# Patient Record
Sex: Male | Born: 1935 | Race: Black or African American | Hispanic: No | Marital: Single | Smoking: Former smoker
Health system: Southern US, Community
[De-identification: ages and names within clinical notes are randomized; demographics above are authoritative.]

## PROBLEM LIST (undated history)

## (undated) DIAGNOSIS — E785 Hyperlipidemia, unspecified: Secondary | ICD-10-CM

## (undated) DIAGNOSIS — M1990 Unspecified osteoarthritis, unspecified site: Secondary | ICD-10-CM

## (undated) DIAGNOSIS — N35919 Unspecified urethral stricture, male, unspecified site: Secondary | ICD-10-CM

## (undated) HISTORY — DX: Unspecified urethral stricture, male, unspecified site: N35.919

## (undated) HISTORY — DX: Hyperlipidemia, unspecified: E78.5

## (undated) HISTORY — DX: Unspecified osteoarthritis, unspecified site: M19.90

---

## 2009-09-13 ENCOUNTER — Ambulatory Visit: Payer: Self-pay

## 2009-10-12 ENCOUNTER — Ambulatory Visit: Payer: Self-pay | Admitting: Gastroenterology

## 2010-10-06 ENCOUNTER — Ambulatory Visit: Payer: Self-pay

## 2010-12-23 ENCOUNTER — Ambulatory Visit: Payer: Self-pay | Admitting: Urology

## 2011-01-03 ENCOUNTER — Ambulatory Visit: Payer: Self-pay | Admitting: Urology

## 2012-09-04 DIAGNOSIS — I714 Abdominal aortic aneurysm, without rupture, unspecified: Secondary | ICD-10-CM | POA: Insufficient documentation

## 2012-09-16 DIAGNOSIS — B3749 Other urogenital candidiasis: Secondary | ICD-10-CM | POA: Insufficient documentation

## 2012-09-16 DIAGNOSIS — N138 Other obstructive and reflux uropathy: Secondary | ICD-10-CM | POA: Insufficient documentation

## 2012-09-16 DIAGNOSIS — R339 Retention of urine, unspecified: Secondary | ICD-10-CM | POA: Insufficient documentation

## 2012-09-16 DIAGNOSIS — Z79899 Other long term (current) drug therapy: Secondary | ICD-10-CM | POA: Insufficient documentation

## 2012-09-16 DIAGNOSIS — N401 Enlarged prostate with lower urinary tract symptoms: Secondary | ICD-10-CM | POA: Insufficient documentation

## 2012-09-16 DIAGNOSIS — E291 Testicular hypofunction: Secondary | ICD-10-CM | POA: Insufficient documentation

## 2012-09-16 DIAGNOSIS — N529 Male erectile dysfunction, unspecified: Secondary | ICD-10-CM | POA: Insufficient documentation

## 2012-09-16 DIAGNOSIS — D414 Neoplasm of uncertain behavior of bladder: Secondary | ICD-10-CM | POA: Insufficient documentation

## 2012-09-16 DIAGNOSIS — N3000 Acute cystitis without hematuria: Secondary | ICD-10-CM | POA: Insufficient documentation

## 2013-01-29 ENCOUNTER — Ambulatory Visit: Payer: Self-pay

## 2013-02-27 ENCOUNTER — Ambulatory Visit: Payer: Self-pay | Admitting: Gastroenterology

## 2013-02-28 LAB — PATHOLOGY REPORT

## 2014-04-16 DIAGNOSIS — N471 Phimosis: Secondary | ICD-10-CM | POA: Insufficient documentation

## 2014-04-16 DIAGNOSIS — N478 Other disorders of prepuce: Secondary | ICD-10-CM | POA: Insufficient documentation

## 2014-04-16 DIAGNOSIS — N481 Balanitis: Secondary | ICD-10-CM | POA: Insufficient documentation

## 2014-06-04 DIAGNOSIS — K625 Hemorrhage of anus and rectum: Secondary | ICD-10-CM | POA: Insufficient documentation

## 2014-06-04 DIAGNOSIS — K5903 Drug induced constipation: Secondary | ICD-10-CM | POA: Insufficient documentation

## 2014-06-06 DIAGNOSIS — R39198 Other difficulties with micturition: Secondary | ICD-10-CM | POA: Insufficient documentation

## 2014-06-06 DIAGNOSIS — R350 Frequency of micturition: Secondary | ICD-10-CM | POA: Insufficient documentation

## 2014-07-16 ENCOUNTER — Ambulatory Visit: Payer: Self-pay | Admitting: Orthopedic Surgery

## 2014-07-16 LAB — SEDIMENTATION RATE: ERYTHROCYTE SED RATE: 55 mm/h — AB (ref 0–20)

## 2014-07-16 LAB — CBC WITH DIFFERENTIAL/PLATELET
Basophil #: 0 10*3/uL (ref 0.0–0.1)
Basophil %: 0.5 %
EOS PCT: 8.7 %
Eosinophil #: 0.6 10*3/uL (ref 0.0–0.7)
HCT: 37 % — AB (ref 40.0–52.0)
HGB: 12 g/dL — ABNORMAL LOW (ref 13.0–18.0)
LYMPHS PCT: 34.6 %
Lymphocyte #: 2.4 10*3/uL (ref 1.0–3.6)
MCH: 28.4 pg (ref 26.0–34.0)
MCHC: 32.4 g/dL (ref 32.0–36.0)
MCV: 88 fL (ref 80–100)
MONOS PCT: 15 %
Monocyte #: 1 x10 3/mm (ref 0.2–1.0)
Neutrophil #: 2.8 10*3/uL (ref 1.4–6.5)
Neutrophil %: 41.2 %
Platelet: 184 10*3/uL (ref 150–440)
RBC: 4.22 10*6/uL — AB (ref 4.40–5.90)
RDW: 14.7 % — ABNORMAL HIGH (ref 11.5–14.5)
WBC: 6.9 10*3/uL (ref 3.8–10.6)

## 2014-09-05 ENCOUNTER — Emergency Department: Payer: Self-pay | Admitting: Student

## 2014-11-16 DIAGNOSIS — M1712 Unilateral primary osteoarthritis, left knee: Secondary | ICD-10-CM | POA: Insufficient documentation

## 2015-01-01 DIAGNOSIS — M7582 Other shoulder lesions, left shoulder: Secondary | ICD-10-CM | POA: Insufficient documentation

## 2015-01-01 DIAGNOSIS — M7542 Impingement syndrome of left shoulder: Secondary | ICD-10-CM | POA: Insufficient documentation

## 2015-01-01 DIAGNOSIS — M7541 Impingement syndrome of right shoulder: Secondary | ICD-10-CM | POA: Insufficient documentation

## 2015-01-08 DIAGNOSIS — I351 Nonrheumatic aortic (valve) insufficiency: Secondary | ICD-10-CM | POA: Insufficient documentation

## 2015-01-08 DIAGNOSIS — R600 Localized edema: Secondary | ICD-10-CM | POA: Insufficient documentation

## 2015-09-21 ENCOUNTER — Other Ambulatory Visit: Payer: Self-pay | Admitting: Surgery

## 2015-09-21 DIAGNOSIS — M7582 Other shoulder lesions, left shoulder: Secondary | ICD-10-CM

## 2015-09-21 DIAGNOSIS — M7542 Impingement syndrome of left shoulder: Secondary | ICD-10-CM

## 2015-09-21 DIAGNOSIS — M7541 Impingement syndrome of right shoulder: Secondary | ICD-10-CM

## 2015-10-12 ENCOUNTER — Ambulatory Visit
Admission: RE | Admit: 2015-10-12 | Discharge: 2015-10-12 | Disposition: A | Discharge status: Home / Self Care | Payer: Medicare Other | Source: Ambulatory Visit | Attending: Surgery | Admitting: Surgery

## 2015-10-12 DIAGNOSIS — M7552 Bursitis of left shoulder: Secondary | ICD-10-CM | POA: Insufficient documentation

## 2015-10-12 DIAGNOSIS — M7582 Other shoulder lesions, left shoulder: Secondary | ICD-10-CM | POA: Diagnosis present

## 2015-10-12 DIAGNOSIS — M7541 Impingement syndrome of right shoulder: Secondary | ICD-10-CM

## 2015-10-12 DIAGNOSIS — M7542 Impingement syndrome of left shoulder: Secondary | ICD-10-CM

## 2015-10-12 DIAGNOSIS — M12812 Other specific arthropathies, not elsewhere classified, left shoulder: Secondary | ICD-10-CM | POA: Diagnosis not present

## 2015-10-15 DIAGNOSIS — M75112 Incomplete rotator cuff tear or rupture of left shoulder, not specified as traumatic: Secondary | ICD-10-CM | POA: Insufficient documentation

## 2016-01-20 ENCOUNTER — Other Ambulatory Visit: Payer: Self-pay | Admitting: Physical Medicine & Rehabilitation

## 2016-01-20 DIAGNOSIS — M48061 Spinal stenosis, lumbar region without neurogenic claudication: Secondary | ICD-10-CM

## 2016-02-10 ENCOUNTER — Ambulatory Visit
Admission: RE | Admit: 2016-02-10 | Discharge: 2016-02-10 | Disposition: A | Discharge status: Home / Self Care | Payer: Medicare Other | Source: Ambulatory Visit | Attending: Physical Medicine & Rehabilitation | Admitting: Physical Medicine & Rehabilitation

## 2016-02-10 DIAGNOSIS — M48061 Spinal stenosis, lumbar region without neurogenic claudication: Secondary | ICD-10-CM

## 2016-02-10 DIAGNOSIS — M4806 Spinal stenosis, lumbar region: Secondary | ICD-10-CM | POA: Diagnosis present

## 2016-06-09 DIAGNOSIS — M47816 Spondylosis without myelopathy or radiculopathy, lumbar region: Secondary | ICD-10-CM | POA: Insufficient documentation

## 2016-07-24 ENCOUNTER — Other Ambulatory Visit (INDEPENDENT_AMBULATORY_CARE_PROVIDER_SITE_OTHER): Payer: Self-pay | Admitting: Vascular Surgery

## 2016-07-24 DIAGNOSIS — I714 Abdominal aortic aneurysm, without rupture, unspecified: Secondary | ICD-10-CM

## 2016-07-24 DIAGNOSIS — I723 Aneurysm of iliac artery: Secondary | ICD-10-CM

## 2016-07-25 ENCOUNTER — Ambulatory Visit (INDEPENDENT_AMBULATORY_CARE_PROVIDER_SITE_OTHER): Payer: Medicare Other

## 2016-07-25 ENCOUNTER — Other Ambulatory Visit: Payer: Self-pay | Admitting: Infectious Diseases

## 2016-07-25 DIAGNOSIS — I723 Aneurysm of iliac artery: Secondary | ICD-10-CM | POA: Diagnosis not present

## 2016-07-25 DIAGNOSIS — I714 Abdominal aortic aneurysm, without rupture, unspecified: Secondary | ICD-10-CM

## 2016-07-25 DIAGNOSIS — R42 Dizziness and giddiness: Secondary | ICD-10-CM | POA: Insufficient documentation

## 2016-08-04 ENCOUNTER — Ambulatory Visit
Admission: RE | Admit: 2016-08-04 | Discharge: 2016-08-04 | Disposition: A | Discharge status: Home / Self Care | Payer: Medicare Other | Source: Ambulatory Visit | Attending: Infectious Diseases | Admitting: Infectious Diseases

## 2016-08-04 ENCOUNTER — Encounter (INDEPENDENT_AMBULATORY_CARE_PROVIDER_SITE_OTHER): Payer: Self-pay

## 2016-08-04 ENCOUNTER — Ambulatory Visit (INDEPENDENT_AMBULATORY_CARE_PROVIDER_SITE_OTHER): Payer: Self-pay | Admitting: Vascular Surgery

## 2016-08-04 DIAGNOSIS — R42 Dizziness and giddiness: Secondary | ICD-10-CM | POA: Diagnosis not present

## 2016-08-04 DIAGNOSIS — R9082 White matter disease, unspecified: Secondary | ICD-10-CM | POA: Insufficient documentation

## 2016-09-05 ENCOUNTER — Ambulatory Visit (INDEPENDENT_AMBULATORY_CARE_PROVIDER_SITE_OTHER): Payer: Medicare Other

## 2016-09-05 ENCOUNTER — Encounter (INDEPENDENT_AMBULATORY_CARE_PROVIDER_SITE_OTHER): Payer: Self-pay | Admitting: Vascular Surgery

## 2016-09-05 ENCOUNTER — Ambulatory Visit (INDEPENDENT_AMBULATORY_CARE_PROVIDER_SITE_OTHER): Payer: Medicare Other | Admitting: Vascular Surgery

## 2016-09-05 ENCOUNTER — Other Ambulatory Visit (INDEPENDENT_AMBULATORY_CARE_PROVIDER_SITE_OTHER): Payer: Self-pay | Admitting: Vascular Surgery

## 2016-09-05 VITALS — BP 150/82 | HR 72 | Resp 16 | Ht 74.0 in | Wt 282.0 lb

## 2016-09-05 DIAGNOSIS — M79603 Pain in arm, unspecified: Secondary | ICD-10-CM

## 2016-09-05 DIAGNOSIS — I723 Aneurysm of iliac artery: Secondary | ICD-10-CM

## 2016-09-05 DIAGNOSIS — M7989 Other specified soft tissue disorders: Secondary | ICD-10-CM

## 2016-09-05 DIAGNOSIS — I10 Essential (primary) hypertension: Secondary | ICD-10-CM | POA: Diagnosis not present

## 2016-09-05 NOTE — Progress Notes (Signed)
MRN : [id number]  [name] Bean is a 82 y.o. (1935-05-20) male who presents with chief complaint of  Chief Complaint  Patient presents with  . Re-evaluation    Ultrasound follow up  .  History of Present Illness: Patient returns today in follow up of two vascular issues. He denies any aneurysm related symptoms. Specifically, the patient denies new back or abdominal pain, or signs of peripheral embolization. His aneurysm duplex from several weeks ago demonstrates stable right common iliac artery aneurysm measuring about 2.2 cm in maximal diameter and left common iliac artery aneurysm measuring just less than 2 cm in maximal diameter. His aorta is ectatic at just less than 3 cm. He also has a venous duplex today to evaluate his leg swelling. His leg swelling is reasonably stable. It has not changed a lot since his last visit. His duplex shows no evidence of DVT or acute superficial thrombophlebitis. There is evidence of previous thrombophlebitis in both small saphenous veins. No superficial venous reflux is identified.  Meds albuterol (PROVENTIL HFA;VENTOLIN HFA) 108 (90 Base) MCG/ACT inhaler           ANDROGEL PUMP 20.25 MG/ACT (1.62%) GEL          aspirin EC 81 MG tablet 81 mg         azelastine (ASTELIN) 0.1 % nasal spray          Cholecalciferol (VITAMIN D3) 1000 units CAPS          docusate sodium (STOOL SOFTENER) 100 MG capsule          fluticasone (FLONASE) 50 MCG/ACT nasal spray          loratadine (CLARITIN) 10 MG tablet          metolazone (ZAROXOLYN) 2.5 MG tablet          montelukast (SINGULAIR) 10 MG tablet          Multiple Vitamin (MULTIVITAMIN) capsule          omeprazole (PRILOSEC) 40 MG capsule          oxyCODONE-acetaminophen (PERCOCET) 10-325 MG tablet          potassium chloride SA (KLOR-CON M20) 20 MEQ tablet          simvastatin (ZOCOR) 10 MG tablet          torsemide (DEMADEX) 20 MG tablet           PMH Hypertension Hyperlipidemia  Social History No ETOH,  tobacco, or IVDU  Family History No bleeding disorder or clotting disorders  Allergies Amoxil Mecobalamin   REVIEW OF SYSTEMS (Negative unless checked)  Constitutional: [] Weight loss  [] Fever  [] Chills Cardiac: [] Chest pain   [] Chest pressure   [] Palpitations   [] Shortness of breath when laying flat   [] Shortness of breath at rest   [] Shortness of breath with exertion. Vascular:  [] Pain in legs with walking   [] Pain in legs at rest   [] Pain in legs when laying flat   [] Claudication   [] Pain in feet when walking  [] Pain in feet at rest  [] Pain in feet when laying flat   [] History of DVT   [] Phlebitis   [x] Swelling in legs   [] Varicose veins   [] Non-healing ulcers Pulmonary:   [] Uses home oxygen   [] Productive cough   [] Hemoptysis   [] Wheeze  [] COPD   [] Asthma Neurologic:  [] Dizziness  [] Blackouts   [] Seizures   [] History of stroke   [] History of TIA  [] Aphasia   [] Temporary blindness   []   Dysphagia   [] Weakness or numbness in arms   [] Weakness or numbness in legs Musculoskeletal:  [x] Arthritis   [] Joint swelling   [x] Joint pain   [] Low back pain Hematologic:  [] Easy bruising  [] Easy bleeding   [] Hypercoagulable state   [] Anemic   Gastrointestinal:  [] Blood in stool   [] Vomiting blood  [] Gastroesophageal reflux/heartburn   [] Abdominal pain Genitourinary:  [] Chronic kidney disease   [] Difficult urination  [] Frequent urination  [] Burning with urination   [] Hematuria Skin:  [] Rashes   [] Ulcers   [] Wounds Psychological:  [] History of anxiety   []  History of major depression.  Physical Examination  BP (!) 150/82 (BP Location: Right Arm)   Pulse 72   Resp 16   Ht 6\' 2"  (1.88 m)   Wt 127.9 kg (282 lb)   BMI 36.21 kg/m  Gen:  WD/WN, NAD. Appears younger than stated age Head: [place]/AT, No temporalis wasting. Ear/Nose/Throat: Hearing grossly intact, nares w/o erythema or drainage, trachea midline Eyes: Conjunctiva clear. Sclera non-icteric Neck: Supple.  No JVD.  Pulmonary:  Good air movement,  no use of accessory muscles.  Cardiac: RRR, normal S1, S2 Vascular:  Vessel Right Left  Radial Palpable Palpable  Ulnar Palpable Palpable  Brachial Palpable Palpable  Carotid Palpable, without bruit Palpable, without bruit  Aorta Not palpable N/A  Femoral Palpable Palpable  Popliteal Palpable Palpable  PT Trace Palpable Trace Palpable  DP 1+ Palpable 1+ Palpable   Gastrointestinal: soft, non-tender/non-distended. No guarding/reflex.  Musculoskeletal: M/S 5/5 throughout.  No deformity or atrophy. 1-2+ BLE edema. Walks with a cane Neurologic: Sensation grossly intact in extremities.  Symmetrical.  Speech is fluent.  Psychiatric: Judgment intact, Mood & affect appropriate for pt's clinical situation. Dermatologic: No rashes or ulcers noted.  No cellulitis or open wounds. Lymph : No Cervical, Axillary, or Inguinal lymphadenopathy.      Labs No results found for this or any previous visit (from the past 2160 hour(s)).  Radiology No results found.    Assessment/Plan  Iliac artery aneurysm (HCC) His aneurysm duplex from several weeks ago demonstrates stable right common iliac artery aneurysm measuring about 2.2 cm in maximal diameter and left common iliac artery aneurysm measuring just less than 2 cm in maximal diameter. His aorta is ectatic at just less than 3 cm. No symptoms.  Recheck in 6 months with duplex  Essential hypertension, benign blood pressure control important in reducing the progression of atherosclerotic disease and aneurysmal degeneration. On appropriate oral medications.   Swelling of limb His duplex shows no evidence of DVT or acute superficial thrombophlebitis. There is evidence of previous thrombophlebitis in both small saphenous veins. No superficial venous reflux is identified. Continue compression stockings, elevation, increasing activity and weight loss. No venous intervention would be of benefit.    Leotis Pain, MD  09/06/2016 10:52 AM    This  note was created with Dragon medical transcription system.  Any errors from dictation are purely unintentional

## 2016-09-06 DIAGNOSIS — I10 Essential (primary) hypertension: Secondary | ICD-10-CM | POA: Insufficient documentation

## 2016-09-06 DIAGNOSIS — M7989 Other specified soft tissue disorders: Secondary | ICD-10-CM | POA: Insufficient documentation

## 2016-09-06 DIAGNOSIS — I723 Aneurysm of iliac artery: Secondary | ICD-10-CM | POA: Insufficient documentation

## 2016-09-06 NOTE — Assessment & Plan Note (Signed)
blood pressure control important in reducing the progression of atherosclerotic disease and aneurysmal degeneration. On appropriate oral medications.  

## 2016-09-06 NOTE — Assessment & Plan Note (Signed)
His aneurysm duplex from several weeks ago demonstrates stable right common iliac artery aneurysm measuring about 2.2 cm in maximal diameter and left common iliac artery aneurysm measuring just less than 2 cm in maximal diameter. His aorta is ectatic at just less than 3 cm. No symptoms.  Recheck in 6 months with duplex

## 2016-09-06 NOTE — Assessment & Plan Note (Signed)
His duplex shows no evidence of DVT or acute superficial thrombophlebitis. There is evidence of previous thrombophlebitis in both small saphenous veins. No superficial venous reflux is identified. Continue compression stockings, elevation, increasing activity and weight loss. No venous intervention would be of benefit.

## 2016-10-03 ENCOUNTER — Ambulatory Visit (INDEPENDENT_AMBULATORY_CARE_PROVIDER_SITE_OTHER): Payer: Self-pay | Admitting: Vascular Surgery

## 2016-10-03 ENCOUNTER — Encounter (INDEPENDENT_AMBULATORY_CARE_PROVIDER_SITE_OTHER): Payer: Medicare Other

## 2017-01-15 ENCOUNTER — Other Ambulatory Visit: Payer: Self-pay | Admitting: Nurse Practitioner

## 2017-01-15 DIAGNOSIS — K219 Gastro-esophageal reflux disease without esophagitis: Secondary | ICD-10-CM

## 2017-01-18 ENCOUNTER — Ambulatory Visit: Payer: Medicare Other

## 2017-02-02 ENCOUNTER — Encounter (INDEPENDENT_AMBULATORY_CARE_PROVIDER_SITE_OTHER): Payer: Self-pay | Admitting: Vascular Surgery

## 2017-02-02 ENCOUNTER — Ambulatory Visit (INDEPENDENT_AMBULATORY_CARE_PROVIDER_SITE_OTHER): Payer: Medicare Other

## 2017-02-02 ENCOUNTER — Ambulatory Visit (INDEPENDENT_AMBULATORY_CARE_PROVIDER_SITE_OTHER): Payer: Medicare Other | Admitting: Vascular Surgery

## 2017-02-02 VITALS — BP 109/62 | HR 81 | Resp 16 | Ht 74.0 in | Wt 274.0 lb

## 2017-02-02 DIAGNOSIS — M7989 Other specified soft tissue disorders: Secondary | ICD-10-CM | POA: Diagnosis not present

## 2017-02-02 DIAGNOSIS — I10 Essential (primary) hypertension: Secondary | ICD-10-CM

## 2017-02-02 DIAGNOSIS — I723 Aneurysm of iliac artery: Secondary | ICD-10-CM

## 2017-02-02 NOTE — Progress Notes (Signed)
MRN : [id number]  [name] Bean is a 82 y.o. (1935/04/29) male who presents with chief complaint of  Chief Complaint  Patient presents with  . AAA    follow up  .  History of Present Illness: Patient returns today in follow up of Small abdominal aortic aneurysm and iliac artery aneurysms. He is doing well without any new complaints. His last checked about 6 months or so ago. He denies any aneurysm related symptoms. Specifically, the patient denies new back or abdominal pain, or signs of peripheral embolization. His duplex today shows a stable 3.2 cm infrarenal abdominal aortic aneurysm associated with a stable 2.3 cm right common iliac artery aneurysm and a stable 1.9 cm left common iliac artery aneurysm. None of these values have changed significantly from his study last fall.  Meds albuterol (PROVENTIL HFA;VENTOLIN HFA) 108 (90 Base) MCG/ACT inhaler           ANDROGEL PUMP 20.25 MG/ACT (1.62%) GEL          aspirin EC 81 MG tablet 81 mg         azelastine (ASTELIN) 0.1 % nasal spray          Cholecalciferol (VITAMIN D3) 1000 units CAPS          docusate sodium (STOOL SOFTENER) 100 MG capsule          fluticasone (FLONASE) 50 MCG/ACT nasal spray          loratadine (CLARITIN) 10 MG tablet          metolazone (ZAROXOLYN) 2.5 MG tablet          montelukast (SINGULAIR) 10 MG tablet          Multiple Vitamin (MULTIVITAMIN) capsule          omeprazole (PRILOSEC) 40 MG capsule          oxyCODONE-acetaminophen (PERCOCET) 10-325 MG tablet          potassium chloride SA (KLOR-CON M20) 20 MEQ tablet          simvastatin (ZOCOR) 10 MG tablet          torsemide (DEMADEX) 20 MG tablet           PMH Hypertension Hyperlipidemia  Social History No ETOH, tobacco, or IVDU  Family History No bleeding disorder or clotting  disorders  Allergies Amoxil Mecobalamin   REVIEW OF SYSTEMS (Negative unless checked)  Constitutional: [] Weight loss  [] Fever  [] Chills Cardiac: [] Chest pain   [] Chest pressure   [] Palpitations   [] Shortness of breath when laying flat   [] Shortness of breath at rest   [] Shortness of breath with exertion. Vascular:  [] Pain in legs with walking   [] Pain in legs at rest   [] Pain in legs when laying flat   [] Claudication   [] Pain in feet when walking  [] Pain in feet at rest  [] Pain in feet when laying flat   [] History of DVT   [] Phlebitis   [x] Swelling in legs   [] Varicose veins   [] Non-healing ulcers Pulmonary:   [] Uses home oxygen   [] Productive cough   [] Hemoptysis   [] Wheeze  [] COPD   [] Asthma Neurologic:  [] Dizziness  [] Blackouts   [] Seizures   [] History of stroke   [] History of TIA  [] Aphasia   [] Temporary blindness   [] Dysphagia   [] Weakness or numbness in arms   [] Weakness or numbness in legs Musculoskeletal:  [x] Arthritis   [] Joint swelling   [x] Joint pain   [] Low back pain Hematologic:  [] Easy  bruising  [] Easy bleeding   [] Hypercoagulable state   [] Anemic   Gastrointestinal:  [] Blood in stool   [] Vomiting blood  [] Gastroesophageal reflux/heartburn   [] Abdominal pain Genitourinary:  [] Chronic kidney disease   [] Difficult urination  [] Frequent urination  [] Burning with urination   [] Hematuria Skin:  [] Rashes   [] Ulcers   [] Wounds Psychological:  [] History of anxiety   []  History of major depression.   Physical Examination  BP 109/62   Pulse 81   Resp 16   Ht 6\' 2"  (1.88 m)   Wt 124.3 kg (274 lb)   BMI 35.18 kg/m  Gen:  WD/WN, NAD Head: [place]/AT, No temporalis wasting. Ear/Nose/Throat: Hearing grossly intact, nares w/o erythema or drainage, trachea midline Eyes: Conjunctiva clear. Sclera non-icteric Neck: Supple.  No JVD.  Pulmonary:  Good air movement, no use of accessory muscles.  Cardiac: RRR, normal S1, S2 Vascular:  Vessel Right Left  Radial Palpable Palpable  Ulnar  Palpable Palpable  Brachial Palpable Palpable  Carotid Palpable, without bruit Palpable, without bruit  Aorta Not palpable N/A  Femoral Palpable Palpable  Popliteal Not Palpable Not Palpable  PT Trace Palpable Not Palpable  DP 1+ Palpable 1+ Palpable   Gastrointestinal: soft, non-tender/non-distended. Obese. No increased aortic impulse Musculoskeletal: M/S 5/5 throughout.  Walks with a cane. 2+ BLE edema. Neurologic: Sensation grossly intact in extremities.  Symmetrical.  Speech is fluent.  Psychiatric: Judgment intact, Mood & affect appropriate for pt's clinical situation. Dermatologic: No rashes or ulcers noted.  No cellulitis or open wounds.       Labs No results found for this or any previous visit (from the past 2160 hour(s)).  Radiology No results found.   Assessment/Plan Essential hypertension, benign blood pressure control important in reducing the progression of atherosclerotic disease and aneurysmal degeneration. On appropriate oral medications.   Swelling of limb His duplex shows no evidence of DVT or acute superficial thrombophlebitis. There is evidence of previous thrombophlebitis in both small saphenous veins. No superficial venous reflux is identified. Continue compression stockings, elevation, increasing activity and weight loss. No venous intervention would be of benefit.  Iliac artery aneurysm (HCC) His duplex today shows a stable 3.2 cm infrarenal abdominal aortic aneurysm associated with a stable 2.3 cm right common iliac artery aneurysm and a stable 1.9 cm left common iliac artery aneurysm. None of these values have changed significantly from his study last fall. At this point, we can go to a once year duplex follow-up. He states that he is moving away and will be moving to [place]. I have asked him to find a vascular surgeon in the [place] area, and once he is there we'll be happy to send them any records. He voices his understanding.    Leotis Pain,  MD  02/02/2017 11:19 AM    This note was created with Dragon medical transcription system.  Any errors from dictation are purely unintentional

## 2017-02-02 NOTE — Assessment & Plan Note (Signed)
His duplex today shows a stable 3.2 cm infrarenal abdominal aortic aneurysm associated with a stable 2.3 cm right common iliac artery aneurysm and a stable 1.9 cm left common iliac artery aneurysm. None of these values have changed significantly from his study last fall. At this point, we can go to a once year duplex follow-up. He states that he is moving away and will be moving to [place]. I have asked him to find a vascular surgeon in the [place] area, and once he is there we'll be happy to send them any records. He voices his understanding.

## 2017-05-15 DIAGNOSIS — R262 Difficulty in walking, not elsewhere classified: Secondary | ICD-10-CM | POA: Insufficient documentation

## 2017-05-15 DIAGNOSIS — R269 Unspecified abnormalities of gait and mobility: Secondary | ICD-10-CM | POA: Insufficient documentation

## 2017-05-15 DIAGNOSIS — R531 Weakness: Secondary | ICD-10-CM | POA: Insufficient documentation

## 2017-05-21 DIAGNOSIS — G629 Polyneuropathy, unspecified: Secondary | ICD-10-CM | POA: Insufficient documentation

## 2017-05-21 DIAGNOSIS — M25472 Effusion, left ankle: Secondary | ICD-10-CM | POA: Insufficient documentation

## 2017-05-23 DIAGNOSIS — I890 Lymphedema, not elsewhere classified: Secondary | ICD-10-CM | POA: Insufficient documentation

## 2017-05-23 DIAGNOSIS — M2550 Pain in unspecified joint: Secondary | ICD-10-CM | POA: Insufficient documentation

## 2017-06-06 ENCOUNTER — Ambulatory Visit: Payer: Medicare Other | Attending: Neurology | Admitting: Physical Therapy

## 2017-06-06 ENCOUNTER — Encounter: Payer: Self-pay | Admitting: Physical Therapy

## 2017-06-06 DIAGNOSIS — R262 Difficulty in walking, not elsewhere classified: Secondary | ICD-10-CM

## 2017-06-06 DIAGNOSIS — M6281 Muscle weakness (generalized): Secondary | ICD-10-CM | POA: Diagnosis present

## 2017-06-06 DIAGNOSIS — R2681 Unsteadiness on feet: Secondary | ICD-10-CM | POA: Diagnosis present

## 2017-06-06 NOTE — Therapy (Signed)
[place] MAIN [facility] SERVICES [address] [place], [place], [address] Phone: [phone]   Fax:  [phone]  Physical Therapy Evaluation  Patient Details  Name: [name] Bean MRN: [id number] Date of Birth: 11-17-1934 Referring Provider: Dr. Melrose Nakayama  Encounter Date: 06/06/2017      PT End of Session - 06/06/17 1009    Visit Number 1   Number of Visits 17   Date for PT Re-Evaluation 08/01/17   Authorization Type gcode 1   Authorization Time Period 10   PT Start Time 0910   PT Stop Time 1000   PT Time Calculation (min) 50 min   Activity Tolerance Patient limited by fatigue   Behavior During Therapy [facility] for tasks assessed/performed      Past Medical History:  Diagnosis Date  . Arthritis    left knee;   . Hyperlipidemia     History reviewed. No pertinent surgical history.  There were no vitals filed for this visit.       Subjective Assessment - 06/06/17 0916    Subjective "I am having difficulty walking."    Pertinent History 82 yo Male reports increased difficulty walking over last few years. He presents to therapy with a walking stick. He has been using the walking stick for last 3 years. Denies using any other AD. He demonstrates a left foot drop with step to gait pattern. Patient reports having history of back pain and spinal surgery; He reports that his back pain has led to LLE weakness. He denies any pain in LLE; He does report numbness/tingling in BLE feet; He reports that numbness has been present for last few years. He reports having a fall within last year. He denies any recent falls in last few months;    How long can you sit comfortably? a long while; no difficulty;    How long can you stand comfortably? 30 min;    How long can you walk comfortably? <500 feet with walking stick;    Diagnostic tests had X-rays of left ankle- no fracture, increased swelling noted;    Patient Stated Goals "walk better." keep left foot from  dragging;    Currently in Pain? No/denies            [facility] PT Assessment - 06/06/17 0001      Assessment   Medical Diagnosis Difficulty walking   Referring Provider Dr. Melrose Nakayama   Onset Date/Surgical Date --  a few years ago   Hand Dominance Right   Next MD Visit 1 week;    Prior Therapy Has had multiple bouts of PT for weakness/difficulty walking; reports last episode of care he had a fall and didn't return;      Precautions   Precautions Fall     Restrictions   Weight Bearing Restrictions No     Balance Screen   Has the patient fallen in the past 6 months No   Has the patient had a decrease in activity level because of a fear of falling?  Yes   Is the patient reluctant to leave their home because of a fear of falling?  No     Home Environment   Additional Comments Lives in an apartment; lives alone; mod I for self care ADLs; cooks meals himself; also grocery shops/drives;      Prior Function   Level of Independence Independent   Vocation Retired   Leisure goes to movies; play with grandchildren;      Associate Professor  Overall Cognitive Status Within Functional Limits for tasks assessed     Observation/Other Assessments   Observations patient exhibits significant swelling in BLE (L>R); he reports that he is waiting on compression hose to help with swelling;    Activities of Balance Confidence Scale (ABC Scale)  53% (low level of physical functioning)     Sensation   Light Touch Appears Intact  does have numbness in feet;    Proprioception Appears Intact     Coordination   Gross Motor Movements are Fluid and Coordinated Yes   Fine Motor Movements are Fluid and Coordinated No   Finger Nose Finger Test dysmetric in BUE      Posture/Postural Control   Posture Comments sits with mild slumped posture, able to self correct with verbal cues;      ROM / Strength   AROM / PROM / Strength AROM;Strength     AROM   Overall AROM Comments BUE and BLE are WFL, ankle DF is WFL  in sitting;      Strength   Overall Strength Comments BUE grossly 4/5; RLE: 4/5, LLE: hip 3-/5, knee 3/5, ankle 3/5     Palpation   Palpation comment denies any tenderness to palpation;      Transfers   Comments requires 2 HHA to push up from chair; Tends to lean to right side with decreased push through LLE. Patient requires increased time and has significant difficulty but is able to transfer with supervision; refused to do 5 times sit<>Stand due to fatigue and difficulty;      Ambulation/Gait   Gait Comments ambulates with walking stick in RUE with step to pattern decreased step length on LLE due to decreased hip flexion and ankle DF during terminal swing. PT applied AFO to LLE and patient was able to demonstrate a step through pattern; gait speed did not change, but step length did improve;      Standardized Balance Assessment   10 Meter Walk 0.43 m/s with walking stick, limited home ambulator; high risk for falls;      High Level Balance   High Level Balance Comments patient able to stand with wide base of support unsupported for short time; demonstrates fair static standing balance being unable to withstand pertubations; demonstrates fair dynamic balance requiring close supervision during gait tasks with AD;             Objective measurements completed on examination: See above findings.    TREATMENT: PT applied AFO to LLE to see if foot clearance improved; Patient required min Vcs to improve hip flexion during swing for better foot clearance; Patient able to demonstrate better foot clearance and improve step length to step through pattern as compared to without AFO.  Patient also instructed in HEP: Seated hip flexion x5 reps bilaterally; Seated LAQ x5 bilaterally; Patient required min-moderate verbal/tactile cues for correct exercise technique.               PT Education - 06/06/17 1009    Education provided Yes   Education Details recommendations; HEP    Person(s) Educated Patient   Methods Explanation;Demonstration;Verbal cues;Handout   Comprehension Verbalized understanding;Returned demonstration;Verbal cues required;Need further instruction          PT Short Term Goals - 06/06/17 1110      PT SHORT TERM GOAL #1   Title Patient will obtain AFO for LLE and be independent in don/doff for safety with gait to improve step length.    Time 4  Period Weeks   Status New   Target Date 07/04/17     PT SHORT TERM GOAL #2   Title Patient will improve LLE hip strength to 3+/5 to improve functional strength for foot clearance for improved gait safety;    Time 4   Period Weeks   Status New   Target Date 07/04/17           PT Long Term Goals - 06/06/17 1116      PT LONG TERM GOAL #1   Title Patient will be independent in home exercise program to improve strength/mobility for better functional independence with ADLs.   Time 8   Period Weeks   Status New   Target Date 08/01/17     PT LONG TERM GOAL #2   Title Patient will increase 10 meter walk test to >0.21m/s as to improve gait speed for better community ambulation and to reduce fall risk.   Time 8   Period Weeks   Status New   Target Date 08/01/17     PT LONG TERM GOAL #3   Title Patient will be independent in sit<>Stand transfers with 1 HHA or less to demonstrate improved transfer ability and to improve safety at home.    Time 8   Period Weeks   Status New   Target Date 08/01/17     PT LONG TERM GOAL #4   Title Patient will increase ABC scale score >80% to demonstrate better functional mobility and better confidence with ADLs.    Time 8   Period Weeks   Status New   Target Date 08/01/17     PT LONG TERM GOAL #5   Title  Patient will demonstrate an improved Berg Balance Score of > 46/56 as to demonstrate improved balance with ADLs such as sitting/standing and transfer balance and reduced fall risk.    Time 9   Period Weeks   Status New   Target Date 08/01/17                 Plan - 06/06/17 1010    Clinical Impression Statement 82 yo Male presents to therapy with increased LLE weakness and swelling which is limiting gait ability. He ambulates with walking stick with step to pattern with wide base of support, demonstrating LLE foot drag with decreased hip flexion and ankle DF during terminal swing. Patient exhibits decreased mobility with significant difficulty with sit<>Stand transfer. He tested at an increased risk for falls with 10 meter walking. Patient does demonstrate impaired balance. He would benefit from additional skilled PT intervention to improve strength, balance and gait safety;    History and Personal Factors relevant to plan of care: lives alone, single story apartment, no recent falls, tested at a high fall risk, demonstrates LLE weakness with foot drag which could contribute to fall risk; older in age; LLE swelling;    Clinical Presentation Evolving   Clinical Presentation due to: worsening LLE weakness with heavy foot drag that puts him at increased risk for falls; pt also has uncontrolled swelling in LLE which contributes to weakness;    Clinical Decision Making Moderate   Rehab Potential Fair   Clinical Impairments Affecting Rehab Potential no significant change from past rehab; older in age; high fall risk;    PT Frequency 2x / week   PT Duration 12 weeks   PT Treatment/Interventions ADLs/Self Care Home Management;Cryotherapy;Electrical Stimulation;Moist Heat;Neuromuscular re-education;Balance training;Therapeutic exercise;Therapeutic activities;Functional mobility training;Stair training;Gait training;DME Instruction;Patient/family education;Orthotic Fit/Training;Manual techniques;Energy conservation   PT  Next Visit Plan advance HEP, strengthening, balance   PT Home Exercise Plan initiated- see patient instructions;    Consulted and Agree with Plan of Care Patient      Patient will benefit from skilled therapeutic  intervention in order to improve the following deficits and impairments:  Abnormal gait, Decreased endurance, Hypomobility, Obesity, Decreased activity tolerance, Decreased strength, Difficulty walking, Decreased mobility, Decreased balance, Decreased safety awareness  Visit Diagnosis: Difficulty in walking, not elsewhere classified - Plan: PT plan of care cert/re-cert  Muscle weakness (generalized) - Plan: PT plan of care cert/re-cert  Unsteadiness on feet - Plan: PT plan of care cert/re-cert      G-Codes - 29/56/21 1016    Functional Assessment Tool Used (Outpatient Only) 10 meter walk, ABC scale, strength, clinical judgement;    Functional Limitation Mobility: Walking and moving around   Mobility: Walking and Moving Around Current Status [phone]) At least 40 percent but less than 60 percent impaired, limited or restricted   Mobility: Walking and Moving Around Goal Status [phone]) At least 20 percent but less than 40 percent impaired, limited or restricted       Problem List Patient Active Problem List   Diagnosis Date Noted  . Iliac artery aneurysm ([place]) 09/06/2016  . Essential hypertension, benign 09/06/2016  . Swelling of limb 09/06/2016    Madison Direnzo PT, DPT 06/06/2017, 11:22 AM  [place] MAIN [facility] SERVICES [address] [place], [place], [address] Phone: [phone]   Fax:  [phone]  Name: [name] Bean MRN: [id number] Date of Birth: 1934-12-02

## 2017-06-06 NOTE — Patient Instructions (Signed)
Knee Extension: Resisted (Sitting)   With band looped around right ankle and under other foot, straighten leg with ankle loop. Keep other leg bent to increase resistance. Repeat _10___ times per set. Do __2__ sets per session. Do _2___ sessions per day.  http://orth.exer.us/691    Copyright  VHI. All rights reserved.  FLEXION: Sitting - Resistance Band (Active)   Sit, both feet flat. Have band tied around both legs above knees, lift right knee toward ceiling.Repeat with other knee Complete _2__ sets of _10__ repetitions. Perform _2__ sessions per day.  http://gtsc.exer.us/21

## 2017-06-11 ENCOUNTER — Ambulatory Visit: Payer: Medicare Other | Admitting: Physical Therapy

## 2017-06-14 ENCOUNTER — Ambulatory Visit: Payer: Medicare Other | Admitting: Physical Therapy

## 2017-06-19 ENCOUNTER — Encounter: Payer: Self-pay | Admitting: Physical Therapy

## 2017-06-19 ENCOUNTER — Ambulatory Visit: Payer: Medicare Other | Admitting: Physical Therapy

## 2017-06-19 DIAGNOSIS — R262 Difficulty in walking, not elsewhere classified: Secondary | ICD-10-CM

## 2017-06-19 DIAGNOSIS — R2681 Unsteadiness on feet: Secondary | ICD-10-CM

## 2017-06-19 DIAGNOSIS — M6281 Muscle weakness (generalized): Secondary | ICD-10-CM

## 2017-06-19 NOTE — Therapy (Signed)
[place] MAIN [facility] SERVICES [address] [place], [place], [address] Phone: [phone]   Fax:  [phone]  Physical Therapy Treatment  Patient Details  Name: [name] Bean MRN: [id number] Date of Birth: 1935/04/13 Referring Provider: Dr. Melrose Nakayama  Encounter Date: 06/19/2017      PT End of Session - 06/19/17 1022    Visit Number 2   Number of Visits 17   Date for PT Re-Evaluation 08/01/17   Authorization Type gcode 2   Authorization Time Period 10   PT Start Time 1015   PT Stop Time 1100   PT Time Calculation (min) 45 min   Equipment Utilized During Treatment Gait belt   Activity Tolerance Patient limited by fatigue   Behavior During Therapy [facility] for tasks assessed/performed      Past Medical History:  Diagnosis Date  . Arthritis    left knee;   . Hyperlipidemia     History reviewed. No pertinent surgical history.  There were no vitals filed for this visit.      Subjective Assessment - 06/19/17 1021    Subjective Patient reports today is one of his worse days. He reports having increased swelling in LLE and having a harder time walking. Patient presents with walking stick. He denies any new falls; Reports feeling sore "all over" this morning;    Pertinent History 82 yo Male reports increased difficulty walking over last few years. He presents to therapy with a walking stick. He has been using the walking stick for last 3 years. Denies using any other AD. He demonstrates a left foot drop with step to gait pattern. Patient reports having history of back pain and spinal surgery; He reports that his back pain has led to LLE weakness. He denies any pain in LLE; He does report numbness/tingling in BLE feet; He reports that numbness has been present for last few years. He reports having a fall within last year. He denies any recent falls in last few months;    How long can you sit comfortably? a long while; no difficulty;    How long can  you stand comfortably? 30 min;    How long can you walk comfortably? <500 feet with walking stick;    Diagnostic tests had X-rays of left ankle- no fracture, increased swelling noted;    Patient Stated Goals "walk better." keep left foot from dragging;    Currently in Pain? Yes   Pain Score 2    Pain Location --  all over   Pain Descriptors / Indicators Aching;Sore   Pain Type Chronic pain   Pain Onset More than a month ago   Pain Frequency Intermittent   Aggravating Factors  mobility/excessive standing/walking   Pain Relieving Factors rest   Effect of Pain on Daily Activities decreased activity tolerance;         TREATMENT: Warm up on Nustep BUE/BLE level 2 x5 min (Unbilled);  BALANCE: Standing on 1/2 bolster with 2 HHA: Heel/toe rock x10 reps with cues to keep knee straight for better ankle stretch/ROM; Balance in middle with BUE wand shoulder flexion x10 reps with min A for safety and cues to increase ankle strategies for better stance control;  Standing on airex foam: Heel/toe raises x10 with rail assist and cues to keep knee straight for better ankle control; Mini squat with 2-1 rail assist x10 with cues to improve positioning for better balance and LE control;  Modified tandem stance with BUE ball pass side/side x  5 each foot in front with min A for safety and cues to improve erect posture for better balance; Feet together, BUE ball up/down x10 reps with CGA for safety;   Gait around gym, x125 on carpet/linoleum with walking stick with CGA to close supervision and min VCs to increase hip flexion during swing for better foot clearance especially on LLE; Patient able to progress to better step length and foot clearance after session;   Exercise: Seated in chair with foam pad: Hip flexion march 2x10  Bilaterally with visual cues to increase ROM for better strengthening/flexibility; LAQ x15 bilaterally with cues to increase ROM for better strength and flexibility;  Standing  with red tband around ankles: Hip abduction x10 bilaterally Hip extension x10 bilaterally; Patient required min-moderate verbal/tactile cues for correct exercise technique including to avoid trunk lean for better hip strengthening;                          PT Education - 06/19/17 1022    Education provided Yes   Education Details strengthening/balance exercise; HEP reinforced;    Person(s) Educated Patient   Methods Explanation;Demonstration;Verbal cues   Comprehension Verbalized understanding;Returned demonstration;Verbal cues required;Need further instruction          PT Short Term Goals - 06/06/17 1110      PT SHORT TERM GOAL #1   Title Patient will obtain AFO for LLE and be independent in don/doff for safety with gait to improve step length.    Time 4   Period Weeks   Status New   Target Date 07/04/17     PT SHORT TERM GOAL #2   Title Patient will improve LLE hip strength to 3+/5 to improve functional strength for foot clearance for improved gait safety;    Time 4   Period Weeks   Status New   Target Date 07/04/17           PT Long Term Goals - 06/06/17 1116      PT LONG TERM GOAL #1   Title Patient will be independent in home exercise program to improve strength/mobility for better functional independence with ADLs.   Time 8   Period Weeks   Status New   Target Date 08/01/17     PT LONG TERM GOAL #2   Title Patient will increase 10 meter walk test to >0.56m/s as to improve gait speed for better community ambulation and to reduce fall risk.   Time 8   Period Weeks   Status New   Target Date 08/01/17     PT LONG TERM GOAL #3   Title Patient will be independent in sit<>Stand transfers with 1 HHA or less to demonstrate improved transfer ability and to improve safety at home.    Time 8   Period Weeks   Status New   Target Date 08/01/17     PT LONG TERM GOAL #4   Title Patient will increase ABC scale score >80% to demonstrate better  functional mobility and better confidence with ADLs.    Time 8   Period Weeks   Status New   Target Date 08/01/17     PT LONG TERM GOAL #5   Title  Patient will demonstrate an improved Berg Balance Score of > 46/56 as to demonstrate improved balance with ADLs such as sitting/standing and transfer balance and reduced fall risk.    Time 9   Period Weeks   Status New   Target  Date 08/01/17               Plan - 06/19/17 1057    Clinical Impression Statement Patient instructed in advanced balance exercise on uneven surface to facilitate better ankle strategies. patient able to stand with less rail assist. Instructed patient in advanced LE strengthening exercise. He does require cues to increase ROM for better strengthening. Patient denies any discomfort at end of session. He was able to exhibit better foot clearance during ambulation at end of session as compared to beginning of session. He would benefit from additional skilled PT intervention to improve strength, balance and gait safety;    Rehab Potential Fair   Clinical Impairments Affecting Rehab Potential no significant change from past rehab; older in age; high fall risk;    PT Frequency 2x / week   PT Duration 12 weeks   PT Treatment/Interventions ADLs/Self Care Home Management;Cryotherapy;Electrical Stimulation;Moist Heat;Neuromuscular re-education;Balance training;Therapeutic exercise;Therapeutic activities;Functional mobility training;Stair training;Gait training;DME Instruction;Patient/family education;Orthotic Fit/Training;Manual techniques;Energy conservation   PT Next Visit Plan advance HEP, strengthening, balance   PT Home Exercise Plan continue as given;    Consulted and Agree with Plan of Care Patient      Patient will benefit from skilled therapeutic intervention in order to improve the following deficits and impairments:  Abnormal gait, Decreased endurance, Hypomobility, Obesity, Decreased activity tolerance,  Decreased strength, Difficulty walking, Decreased mobility, Decreased balance, Decreased safety awareness  Visit Diagnosis: Difficulty in walking, not elsewhere classified  Muscle weakness (generalized)  Unsteadiness on feet     Problem List Patient Active Problem List   Diagnosis Date Noted  . Iliac artery aneurysm ([place]) 09/06/2016  . Essential hypertension, benign 09/06/2016  . Swelling of limb 09/06/2016    Jakya Dovidio PT, DPT 06/19/2017, 10:59 AM  [place] MAIN [facility] SERVICES [address] [place], [place], [address] Phone: [phone]   Fax:  [phone]  Name: [name] Bean MRN: [id number] Date of Birth: 25-Aug-1935

## 2017-06-26 ENCOUNTER — Encounter: Payer: Self-pay | Admitting: Physical Therapy

## 2017-06-26 ENCOUNTER — Ambulatory Visit: Payer: Medicare Other | Admitting: Physical Therapy

## 2017-06-26 DIAGNOSIS — R262 Difficulty in walking, not elsewhere classified: Secondary | ICD-10-CM

## 2017-06-26 DIAGNOSIS — R2681 Unsteadiness on feet: Secondary | ICD-10-CM

## 2017-06-26 DIAGNOSIS — M6281 Muscle weakness (generalized): Secondary | ICD-10-CM

## 2017-06-26 NOTE — Therapy (Signed)
[place] MAIN [facility] SERVICES [address] [place], [place], [address] Phone: [phone]   Fax:  [phone]  Physical Therapy Treatment  Patient Details  Name: [name] Bean MRN: [id number] Date of Birth: 1934/12/18 Referring Provider: Dr. Melrose Nakayama  Encounter Date: 06/26/2017      PT End of Session - 06/26/17 1023    Visit Number 3   Number of Visits 17   Date for PT Re-Evaluation 08/01/17   Authorization Type gcode [id number]   Authorization Time Period 10   PT Start Time 1014   PT Stop Time 1100   PT Time Calculation (min) 46 min   Equipment Utilized During Treatment Gait belt   Activity Tolerance Patient limited by fatigue;Patient tolerated treatment well   Behavior During Therapy [facility] for tasks assessed/performed      Past Medical History:  Diagnosis Date  . Arthritis    left knee;   . Hyperlipidemia     History reviewed. No pertinent surgical history.  There were no vitals filed for this visit.      Subjective Assessment - 06/26/17 1021    Subjective Patient reports doing okay today; Denies any pain but does report increased stiffness. He reports ony doing HEP about 1-2 times since last session. "I didn't do many of the exercises. I know that I have to start moving more but its hard."    Pertinent History 82 yo Male reports increased difficulty walking over last few years. He presents to therapy with a walking stick. He has been using the walking stick for last 3 years. Denies using any other AD. He demonstrates a left foot drop with step to gait pattern. Patient reports having history of back pain and spinal surgery; He reports that his back pain has led to LLE weakness. He denies any pain in LLE; He does report numbness/tingling in BLE feet; He reports that numbness has been present for last few years. He reports having a fall within last year. He denies any recent falls in last few months;    How long can you sit comfortably? a long  while; no difficulty;    How long can you stand comfortably? 30 min;    How long can you walk comfortably? <500 feet with walking stick;    Diagnostic tests had X-rays of left ankle- no fracture, increased swelling noted;    Patient Stated Goals "walk better." keep left foot from dragging;    Currently in Pain? No/denies            TREATMENT: Warm up on Nustep BUE/BLE level 3 x5 min (Unbilled);  BALANCE: Standing on 1/2 bolster with 2 HHA: Heel/toe rock x15 reps with cues to keep knee straight for better ankle stretch/ROM; Balance in middle with BUE wand shoulder flexion x10 reps with min A for safety and cues to increase ankle strategies for better stance control; Tandem stance with 2-0 rail assist 10 sec hold x2 each foot in front; Patient required min VCs for balance stability, including to increase trunk control for less loss of balance with smaller base of support   Standing on airex foam: Heel/toe raises x15 with rail assist and cues to keep knee straight for better ankle control; Alternate toe taps on 4 inch step with 2-1 rail assist x15 bilaterally with cues to increase hip flexion for better foot clearance and to improve weight shift; Standing one foot on step, one foot on airex, BUE ball pass side/side x10 each direction with min  A for safety and cues to increase upper trunk rotation and utilize visual cues for better stance control;  Gait around gym, x160 on carpet/linoleum with walking stick with close supervision and min VCs to increase hip flexion during swing for better foot clearance especially on LLE; Patient able to progress to better step length and foot clearance after session;   Exercise: Seated on elevated mat table: Hip flexion march red tband 2x10  Bilaterally with visual cues to increase ROM for better strengthening/flexibility; BLE hip abduction red tband 2x15 with visual cues to increase ROM for better strengthening; Required cues to increase upper  trunk control and avoid posterior lean for better exercise tolerance;  BLE hamstring curl, red tband 2x10 bilaterally with cues to increase ROM for better hamstring strengthening   Sit<>stand from elevated mat table x10 without pushing on table with cues to increase forward lean and to increase push through LE strengthening;                       PT Education - 06/26/17 1022    Education provided Yes   Education Details strengthening/balance exercise, HEP reinforced;    Person(s) Educated Patient   Methods Explanation;Demonstration;Verbal cues   Comprehension Verbalized understanding;Returned demonstration;Verbal cues required;Need further instruction          PT Short Term Goals - 06/06/17 1110      PT SHORT TERM GOAL #1   Title Patient will obtain AFO for LLE and be independent in don/doff for safety with gait to improve step length.    Time 4   Period Weeks   Status New   Target Date 07/04/17     PT SHORT TERM GOAL #2   Title Patient will improve LLE hip strength to 3+/5 to improve functional strength for foot clearance for improved gait safety;    Time 4   Period Weeks   Status New   Target Date 07/04/17           PT Long Term Goals - 06/06/17 1116      PT LONG TERM GOAL #1   Title Patient will be independent in home exercise program to improve strength/mobility for better functional independence with ADLs.   Time 8   Period Weeks   Status New   Target Date 08/01/17     PT LONG TERM GOAL #2   Title Patient will increase 10 meter walk test to >0.36m/s as to improve gait speed for better community ambulation and to reduce fall risk.   Time 8   Period Weeks   Status New   Target Date 08/01/17     PT LONG TERM GOAL #3   Title Patient will be independent in sit<>Stand transfers with 1 HHA or less to demonstrate improved transfer ability and to improve safety at home.    Time 8   Period Weeks   Status New   Target Date 08/01/17     PT LONG  TERM GOAL #4   Title Patient will increase ABC scale score >80% to demonstrate better functional mobility and better confidence with ADLs.    Time 8   Period Weeks   Status New   Target Date 08/01/17     PT LONG TERM GOAL #5   Title  Patient will demonstrate an improved Berg Balance Score of > 46/56 as to demonstrate improved balance with ADLs such as sitting/standing and transfer balance and reduced fall risk.    Time 9  Period Weeks   Status New   Target Date 08/01/17               Plan - 06/26/17 1049    Clinical Impression Statement Patient instructed in advanced balance and LE strengthening exercise. He does require rail assist during balance exercise having difficulty reducing rail assist. Patient does require cues to increase upper trunk control during balance exercise. Advanced LE strengthening with increased resistance/repetitions. Patient does report increased fatigue with advanced exercise. Reinforced HEP. Patient would benefit from additional skilled PT Intervention to improve strength, balance and gait safety;    Rehab Potential Fair   Clinical Impairments Affecting Rehab Potential no significant change from past rehab; older in age; high fall risk;    PT Frequency 2x / week   PT Duration 12 weeks   PT Treatment/Interventions ADLs/Self Care Home Management;Cryotherapy;Electrical Stimulation;Moist Heat;Neuromuscular re-education;Balance training;Therapeutic exercise;Therapeutic activities;Functional mobility training;Stair training;Gait training;DME Instruction;Patient/family education;Orthotic Fit/Training;Manual techniques;Energy conservation   PT Next Visit Plan advance HEP, strengthening, balance   PT Home Exercise Plan continue as given;    Consulted and Agree with Plan of Care Patient      Patient will benefit from skilled therapeutic intervention in order to improve the following deficits and impairments:  Abnormal gait, Decreased endurance, Hypomobility,  Obesity, Decreased activity tolerance, Decreased strength, Difficulty walking, Decreased mobility, Decreased balance, Decreased safety awareness  Visit Diagnosis: Difficulty in walking, not elsewhere classified  Muscle weakness (generalized)  Unsteadiness on feet     Problem List Patient Active Problem List   Diagnosis Date Noted  . Iliac artery aneurysm ([place]) 09/06/2016  . Essential hypertension, benign 09/06/2016  . Swelling of limb 09/06/2016    Jossiah Smoak PT, DPT 06/26/2017, 11:01 AM  [place] MAIN [facility] SERVICES [address] [place], [place], [address] Phone: [phone]   Fax:  [phone]  Name: [name] Bean MRN: [id number] Date of Birth: [birth year]

## 2017-06-28 ENCOUNTER — Ambulatory Visit: Payer: Medicare Other | Admitting: Physical Therapy

## 2017-07-02 ENCOUNTER — Encounter: Payer: Self-pay | Admitting: Physical Therapy

## 2017-07-02 ENCOUNTER — Ambulatory Visit: Payer: Medicare Other | Admitting: Physical Therapy

## 2017-07-02 DIAGNOSIS — R262 Difficulty in walking, not elsewhere classified: Secondary | ICD-10-CM | POA: Diagnosis not present

## 2017-07-02 DIAGNOSIS — R2681 Unsteadiness on feet: Secondary | ICD-10-CM

## 2017-07-02 DIAGNOSIS — M6281 Muscle weakness (generalized): Secondary | ICD-10-CM

## 2017-07-02 NOTE — Therapy (Signed)
[place] MAIN [facility] SERVICES [address] [place], [place], [address] Phone: [phone]   Fax:  [phone]  Physical Therapy Treatment  Patient Details  Name: [name] Bean MRN: [id number] Date of Birth: [birth year] Referring Provider: Dr. Melrose Nakayama  Encounter Date: 07/02/2017      PT End of Session - 07/02/17 0941    Visit Number 4   Number of Visits 17   Date for PT Re-Evaluation 08/01/17   Authorization Type gcode [id number]   Authorization Time Period 10   PT Start Time 0932   PT Stop Time 1015   PT Time Calculation (min) 43 min   Equipment Utilized During Treatment Gait belt   Activity Tolerance Patient limited by fatigue;Patient tolerated treatment well   Behavior During Therapy [facility] for tasks assessed/performed      Past Medical History:  Diagnosis Date  . Arthritis    left knee;   . Hyperlipidemia     History reviewed. No pertinent surgical history.  There were no vitals filed for this visit.      Subjective Assessment - 07/02/17 0940    Subjective Patient reports doing okay; He reports increased stiffness today; He requested to only do therapy 1x a week due to soreness and fatigue from last session. "I think that I only want to try 1x a week and see how it goes." He reports mixed compliance with HEP:    Pertinent History 82 yo Male reports increased difficulty walking over last few years. He presents to therapy with a walking stick. He has been using the walking stick for last 3 years. Denies using any other AD. He demonstrates a left foot drop with step to gait pattern. Patient reports having history of back pain and spinal surgery; He reports that his back pain has led to LLE weakness. He denies any pain in LLE; He does report numbness/tingling in BLE feet; He reports that numbness has been present for last few years. He reports having a fall within last year. He denies any recent falls in last few months;    How long can you sit  comfortably? a long while; no difficulty;    How long can you stand comfortably? 30 min;    How long can you walk comfortably? <500 feet with walking stick;    Diagnostic tests had X-rays of left ankle- no fracture, increased swelling noted;    Patient Stated Goals "walk better." keep left foot from dragging;    Currently in Pain? No/denies           TREATMENT: Warm up on Nustep BUE/BLE level 3 x5 min (Unbilled);  BALANCE: Standing on 1/2 bolster with 2 HHA: Heel/toe rock x15 reps with cues to keep knee straight for better ankle stretch/ROM; Balance in middle with BUE wand shoulder flexion x10 reps with min A for safety and cues to increase ankle strategies for better stance control; Patient required min VCs for balance stability, including to increase trunk control for less loss of balance with smaller base of support  Standing on firm surface: Alternate toe taps on 4 inch step with 1 rail assist x15 bilaterally with cues to increase hip flexion for better foot clearance;  Gait around gym, x80 on carpet/linoleum with walking stick with close supervision and min VCs to increase hip flexion during swing for better foot clearance especially on LLE; Patient able to progress to better step length and foot clearance after session;   Exercise: Standing with 2# ankle weights  on BLE: Alternate hip flexion march x10 bilaterally; Alternate knee flexion hamstring curl x10 bilaterally; Hip abduction SLR x10 bilaterally; Patient required min-moderate verbal/tactile cues for correct exercise technique including to increase ROM without trunk lean for better hip strengthening;   Side step over 1/2 bolster with red tband around BLE ankles x5 each direction with cues to increase step length for better foot clearance and improved hip abductor strengthening;   Seated on elevated mat table: Ankle DF red tband x15 bilaterally with cues to increase ROM for better strengthening;  LAQ 2# x15  bilaterally with cues to slow down and increase knee extension for better strengthening;    Reinforced importance of HEP compliance/adherence for better strengthening;                         PT Education - 07/02/17 0940    Education provided Yes   Education Details strengthening/balance exercise, HEP reinforced;    Person(s) Educated Patient   Methods Explanation;Demonstration;Verbal cues   Comprehension Returned demonstration;Verbal cues required;Verbalized understanding;Need further instruction          PT Short Term Goals - 06/06/17 1110      PT SHORT TERM GOAL #1   Title Patient will obtain AFO for LLE and be independent in don/doff for safety with gait to improve step length.    Time 4   Period Weeks   Status New   Target Date 07/04/17     PT SHORT TERM GOAL #2   Title Patient will improve LLE hip strength to 3+/5 to improve functional strength for foot clearance for improved gait safety;    Time 4   Period Weeks   Status New   Target Date 07/04/17           PT Long Term Goals - 06/06/17 1116      PT LONG TERM GOAL #1   Title Patient will be independent in home exercise program to improve strength/mobility for better functional independence with ADLs.   Time 8   Period Weeks   Status New   Target Date 08/01/17     PT LONG TERM GOAL #2   Title Patient will increase 10 meter walk test to >0.42m/s as to improve gait speed for better community ambulation and to reduce fall risk.   Time 8   Period Weeks   Status New   Target Date 08/01/17     PT LONG TERM GOAL #3   Title Patient will be independent in sit<>Stand transfers with 1 HHA or less to demonstrate improved transfer ability and to improve safety at home.    Time 8   Period Weeks   Status New   Target Date 08/01/17     PT LONG TERM GOAL #4   Title Patient will increase ABC scale score >80% to demonstrate better functional mobility and better confidence with ADLs.    Time 8    Period Weeks   Status New   Target Date 08/01/17     PT LONG TERM GOAL #5   Title  Patient will demonstrate an improved Berg Balance Score of > 46/56 as to demonstrate improved balance with ADLs such as sitting/standing and transfer balance and reduced fall risk.    Time 9   Period Weeks   Status New   Target Date 08/01/17               Plan - 07/02/17 1001    Clinical Impression Statement  Patient instructed in advanced LE strengthening/ROM exercise to reduce stiffness and improve strength. Advanced exercise with increased repetition/resistance; Patient does require min VCs for correct exercise technique/positioning. Patient also instructed in advanced balance exercise. He continues to require rail assist for better stance control. Patient would benefit from additional skilled PT intervention to improve strength, balance and gait safety;    Rehab Potential Fair   Clinical Impairments Affecting Rehab Potential no significant change from past rehab; older in age; high fall risk;    PT Frequency 2x / week   PT Duration 12 weeks   PT Treatment/Interventions ADLs/Self Care Home Management;Cryotherapy;Electrical Stimulation;Moist Heat;Neuromuscular re-education;Balance training;Therapeutic exercise;Therapeutic activities;Functional mobility training;Stair training;Gait training;DME Instruction;Patient/family education;Orthotic Fit/Training;Manual techniques;Energy conservation   PT Next Visit Plan advance HEP, strengthening, balance   PT Home Exercise Plan continue as given;    Consulted and Agree with Plan of Care Patient      Patient will benefit from skilled therapeutic intervention in order to improve the following deficits and impairments:  Abnormal gait, Decreased endurance, Hypomobility, Obesity, Decreased activity tolerance, Decreased strength, Difficulty walking, Decreased mobility, Decreased balance, Decreased safety awareness  Visit Diagnosis: Difficulty in walking, not  elsewhere classified  Muscle weakness (generalized)  Unsteadiness on feet     Problem List Patient Active Problem List   Diagnosis Date Noted  . Iliac artery aneurysm ([place]) 09/06/2016  . Essential hypertension, benign 09/06/2016  . Swelling of limb 09/06/2016    Trotter,Margaret PT, DPT 07/02/2017, 12:55 PM  [place] MAIN [facility] SERVICES [address] [place], [place], [address] Phone: [phone]   Fax:  [phone]  Name: Antonino Nienhuis MRN: [id number] Date of Birth: 25-Jul-1935

## 2017-07-04 ENCOUNTER — Ambulatory Visit: Payer: Medicare Other | Admitting: Physical Therapy

## 2017-07-09 ENCOUNTER — Encounter: Payer: Self-pay | Admitting: Physical Therapy

## 2017-07-09 ENCOUNTER — Ambulatory Visit: Payer: Medicare Other | Attending: Neurology | Admitting: Physical Therapy

## 2017-07-09 DIAGNOSIS — R2681 Unsteadiness on feet: Secondary | ICD-10-CM | POA: Insufficient documentation

## 2017-07-09 DIAGNOSIS — M6281 Muscle weakness (generalized): Secondary | ICD-10-CM | POA: Diagnosis present

## 2017-07-09 DIAGNOSIS — R262 Difficulty in walking, not elsewhere classified: Secondary | ICD-10-CM | POA: Diagnosis present

## 2017-07-09 NOTE — Therapy (Signed)
[place] MAIN [facility] SERVICES [address] [place], [place], [address] Phone: [phone]   Fax:  [phone]  Physical Therapy Treatment/Progress Note  Patient Details  Name: [name] Bean MRN: [id number] Date of Birth: December 02, 1934 Referring Provider: Dr. Melrose Nakayama   Encounter Date: 07/09/2017  PT End of Session - 07/09/17 0938    Visit Number  5    Number of Visits  17    Date for PT Re-Evaluation  08/01/17    Authorization Type  gcode 5    Authorization Time Period  10    PT Start Time  0935    PT Stop Time  1020    PT Time Calculation (min)  45 min    Equipment Utilized During Treatment  Gait belt    Activity Tolerance  Patient limited by fatigue;Patient tolerated treatment well    Behavior During Therapy  [facility] for tasks assessed/performed       Past Medical History:  Diagnosis Date  . Arthritis    left knee;   . Hyperlipidemia     History reviewed. No pertinent surgical history.  There were no vitals filed for this visit.  Subjective Assessment - 07/09/17 0937    Subjective  Patient reports increased stiffness today; He reports not being very compliant with HEP; Patient reports being sore after last session due to increased activity. He also stated, "I don't think that I am going to get that  brace." referring to an AFO;     Pertinent History  82 yo Male reports increased difficulty walking over last few years. He presents to therapy with a walking stick. He has been using the walking stick for last 3 years. Denies using any other AD. He demonstrates a left foot drop with step to gait pattern. Patient reports having history of back pain and spinal surgery; He reports that his back pain has led to LLE weakness. He denies any pain in LLE; He does report numbness/tingling in BLE feet; He reports that numbness has been present for last few years. He reports having a fall within last year. He denies any recent falls in last few months;     How long can you sit comfortably?  a long while; no difficulty;     How long can you stand comfortably?  30 min;     How long can you walk comfortably?  <500 feet with walking stick;     Diagnostic tests  had X-rays of left ankle- no fracture, increased swelling noted;     Patient Stated Goals  "walk better." keep left foot from dragging;     Currently in Pain?  No/denies         [facility] PT Assessment - 07/09/17 0001      Observation/Other Assessments   Activities of Balance Confidence Scale (ABC Scale)   45% (low level of physical functioning, slightly more impaired since initial eval on 06/06/17 which was 53% impaired)      Strength   Overall Strength Comments  RLE: 4/5, LLE: Hip flex 3/5, abduction/adduction 3+/5, knee: 3+/5, ankle: 3/5      Standardized Balance Assessment   10 Meter Walk  0.74 m/s with walking stick, limited home ambulator increasd risk for falls; improved from initial eval on 06/06/17 which was 0.43 m/s      Berg Balance Test   Sit to Stand  Able to stand using hands after several tries    Standing Unsupported  Able to stand safely 2 minutes  Sitting with Back Unsupported but Feet Supported on Floor or Stool  Able to sit safely and securely 2 minutes    Stand to Sit  Controls descent by using hands    Transfers  Able to transfer with verbal cueing and /or supervision    Standing Unsupported with Eyes Closed  Able to stand 10 seconds with supervision    Standing Ubsupported with Feet Together  Able to place feet together independently and stand for 1 minute with supervision    From Standing, Reach Forward with Outstretched Arm  Can reach forward >12 cm safely (5")    From Standing Position, Pick up Object from Floor  Unable to pick up and needs supervision    From Standing Position, Turn to Look Behind Over each Shoulder  Looks behind from both sides and weight shifts well    Turn 360 Degrees  Needs assistance while turning    Standing Unsupported, Alternately  Place Feet on Step/Stool  Needs assistance to keep from falling or unable to try    Standing Unsupported, One Foot in [employer] balance while stepping or standing    Standing on One Leg  Unable to try or needs assist to prevent fall    Total Score  29    Berg comment:  <36/56 indicates 100% risk for falls;           TREATMENT: Warm up on Nustep BUE/BLE level 3x5 min (Unbilled);  Instructed patient in 10 meter walk, Berg, assessed strength etc to address goals, see above; Patient had made progress towards most goals but has not met any of them. Recommend patient improve compliance/adherence to HEP for better progress; He verbalized understanding;  Patient able to transfer from elevated surface with 1 HHA only, but does require 2 HHA and several attempts from regular height chair. His transfer limitation is due to LE weakness but also due to increased height;  BALANCE: Standing on 1/2 bolster with 2 HHA: Heel/toe rock x15reps with cues to keep knee straight for better ankle stretch/ROM; Patient required min VCs for balance stability, including to increase trunk control for less loss of balance with smaller base of support  Standing on airex: Modified tandem stance with BUE ball pass side/side x10 each direction with cues to keep both hands on ball for better balance challenge; Feet apart, mini squat with arms out front (unsupport) x10 reps with min A for safety to improve stance control and reduce posterior loss of balance;  Standing on firm surface: Alternate toe taps on 4 inch step with 1 rail assist x10 bilaterally with cues to increase hip flexion for better foot clearance;  Exercise: Side step on firm surface green tband around BLE, 2 HHA x10 feet x2 laps each direction with cues to keep foot oriented forward and to increase hip abduction for better strengthening;   Seated on in chair: Green tband around BLE: Hip flexion march 2x10 with visual cues to increase ROM for  better strengthening; Hip abduction/ER 2x10 with cues to increase ROM for better strengthening;    Reinforced importance of HEP compliance/adherence for better strengthening;                     PT Education - 07/09/17 0938    Education provided  Yes    Education Details  strengthening/balance exercise; HEP reinforced; progress towards goals;     Person(s) Educated  Patient    Methods  Explanation;Demonstration;Verbal cues    Comprehension  Verbalized  understanding;Returned demonstration;Verbal cues required;Need further instruction       PT Short Term Goals - 07/09/17 3154      PT SHORT TERM GOAL #1   Title  Patient will obtain AFO for LLE and be independent in don/doff for safety with gait to improve step length.     Time  4    Period  Weeks    Status  Not Met    Target Date  07/04/17      PT SHORT TERM GOAL #2   Title  Patient will improve LLE hip strength to 3+/5 to improve functional strength for foot clearance for improved gait safety;     Time  4    Period  Weeks    Status  Partially Met    Target Date  07/04/17        PT Long Term Goals - 07/09/17 0939      PT LONG TERM GOAL #1   Title  Patient will be independent in home exercise program to improve strength/mobility for better functional independence with ADLs.    Time  8    Period  Weeks    Status  On-going    Target Date  08/01/17      PT LONG TERM GOAL #2   Title  Patient will increase 10 meter walk test to >0.26ms as to improve gait speed for better community ambulation and to reduce fall risk.    Time  8    Period  Weeks    Status  Partially Met    Target Date  08/01/17      PT LONG TERM GOAL #3   Title  Patient will be independent in sit<>Stand transfers with 1 HHA or less to demonstrate improved transfer ability and to improve safety at home.     Time  8    Period  Weeks    Status  Partially Met    Target Date  08/01/17      PT LONG TERM GOAL #4   Title  Patient will  increase ABC scale score >80% to demonstrate better functional mobility and better confidence with ADLs.     Time  8    Period  Weeks    Status  Not Met    Target Date  08/01/17      PT LONG TERM GOAL #5   Title   Patient will demonstrate an improved Berg Balance Score of > 46/56 as to demonstrate improved balance with ADLs such as sitting/standing and transfer balance and reduced fall risk.     Time  8    Period  Weeks    Status  Not Met    Target Date  08/01/17            Plan - 07/09/17 1020    Clinical Impression Statement  Patient instructed in outcome  measures to assess progress towards goals. He is making some progress but it is slow. Patient admits that he has not been very compliant/adherent to his HEP. Reinforced importance of HEP adherence for improvement in mobility. Patient instructed in advanced balance and LE strengthening exercise. He does require cues for better weight shift and stance control. Patient would benefit from additional skilled PT intervention to improve balance, strength and mobility;     Rehab Potential  Fair    Clinical Impairments Affecting Rehab Potential  no significant change from past rehab; older in age; high fall risk;     PT Frequency  2x /  week    PT Duration  12 weeks    PT Treatment/Interventions  ADLs/Self Care Home Management;Cryotherapy;Electrical Stimulation;Moist Heat;Neuromuscular re-education;Balance training;Therapeutic exercise;Therapeutic activities;Functional mobility training;Stair training;Gait training;DME Instruction;Patient/family education;Orthotic Fit/Training;Manual techniques;Energy conservation    PT Next Visit Plan  advance HEP, strengthening, balance    PT Home Exercise Plan  continue as given;     Consulted and Agree with Plan of Care  Patient       Patient will benefit from skilled therapeutic intervention in order to improve the following deficits and impairments:  Abnormal gait, Decreased endurance, Hypomobility,  Obesity, Decreased activity tolerance, Decreased strength, Difficulty walking, Decreased mobility, Decreased balance, Decreased safety awareness  Visit Diagnosis: Difficulty in walking, not elsewhere classified  Muscle weakness (generalized)  Unsteadiness on feet     Problem List Patient Active Problem List   Diagnosis Date Noted  . Iliac artery aneurysm ([place]) 09/06/2016  . Essential hypertension, benign 09/06/2016  . Swelling of limb 09/06/2016    Rudi Knippenberg PT, DPT 07/09/2017, 10:21 AM  [place] MAIN [facility] SERVICES [address] [place], [place], [address] Phone: [phone]   Fax:  [phone]  Name: Adal Sereno MRN: [id number] Date of Birth: Feb 04, 1935

## 2017-07-11 ENCOUNTER — Ambulatory Visit: Payer: Medicare Other | Admitting: Physical Therapy

## 2017-07-16 ENCOUNTER — Ambulatory Visit: Payer: Medicare Other | Admitting: Physical Therapy

## 2017-07-16 ENCOUNTER — Encounter: Payer: Self-pay | Admitting: Physical Therapy

## 2017-07-16 DIAGNOSIS — R262 Difficulty in walking, not elsewhere classified: Secondary | ICD-10-CM

## 2017-07-16 DIAGNOSIS — R2681 Unsteadiness on feet: Secondary | ICD-10-CM

## 2017-07-16 DIAGNOSIS — M6281 Muscle weakness (generalized): Secondary | ICD-10-CM

## 2017-07-16 NOTE — Therapy (Signed)
[place] MAIN [facility] SERVICES [address] [place], [place], [address] Phone: [phone]   Fax:  [phone]  Physical Therapy Treatment  Patient Details  Name: [name] Bean MRN: [id number] Date of Birth: [birth year] Referring Provider: Dr. Melrose Nakayama   Encounter Date: 07/16/2017  PT End of Session - 07/16/17 0941    Visit Number  6    Number of Visits  17    Date for PT Re-Evaluation  08/01/17    Authorization Type  gcode 6    Authorization Time Period  10    PT Start Time  0932    PT Stop Time  1015    PT Time Calculation (min)  43 min    Equipment Utilized During Treatment  Gait belt    Activity Tolerance  Patient limited by fatigue;Patient tolerated treatment well    Behavior During Therapy  [facility] for tasks assessed/performed       Past Medical History:  Diagnosis Date  . Arthritis    left knee;   . Hyperlipidemia     History reviewed. No pertinent surgical history.  There were no vitals filed for this visit.  Subjective Assessment - 07/16/17 0940    Subjective  Patient reports increased LLE knee pain. He reports doing exercises daily but states, "I don't do as much as I should. I don't know. I think that its laziness."     Pertinent History  82 yo Male reports increased difficulty walking over last few years. He presents to therapy with a walking stick. He has been using the walking stick for last 3 years. Denies using any other AD. He demonstrates a left foot drop with step to gait pattern. Patient reports having history of back pain and spinal surgery; He reports that his back pain has led to LLE weakness. He denies any pain in LLE; He does report numbness/tingling in BLE feet; He reports that numbness has been present for last few years. He reports having a fall within last year. He denies any recent falls in last few months;     How long can you sit comfortably?  a long while; no difficulty;     How long can you stand comfortably?   30 min;     How long can you walk comfortably?  <500 feet with walking stick;     Diagnostic tests  had X-rays of left ankle- no fracture, increased swelling noted;     Patient Stated Goals  "walk better." keep left foot from dragging;     Currently in Pain?  Yes    Pain Score  4     Pain Location  Knee    Pain Orientation  Left    Pain Descriptors / Indicators  Aching;Sore    Pain Type  Chronic pain    Pain Onset  More than a month ago    Pain Frequency  Intermittent    Aggravating Factors   transfers, standing/walking    Pain Relieving Factors  rest    Effect of Pain on Daily Activities  decreased activity tolerance;     Multiple Pain Sites  No            TREATMENT: Warm up on Nustep BUE/BLE level 2x5 min (Unbilled);  BALANCE: Standing on 1/2 bolster with 2 HHA: Heel/toe rock x15reps with cues to keep knee straight for better ankle stretch/ROM; With feet apart, BUE wand flexion with cues to keep ankles in neutral position for better ankle strategies and  balance x10 reps; CGA-min A required for safety;  Patient required min VCs for balance stability, including to increase trunk control for less loss of balance with smaller base of support  Standing on airex: Heel/toe raises x15 with B rail assist with cues to keep knees straight for better ankle strengthening; Alternate toe taps on 4 inch step with 1 rail assist x15 reps bilaterally with cues to increase erect posture and increase hip flexion for better foot clearance; Standing one foot on airex, one foot on 4 inch step: Head turns side/side, up/down x5 reps each, each foot in front;    Exercise: Side step over 1/2 bolster with red tband around BLE, 2 HHA x10 reps each direction with cues to keep foot oriented forward and to increase hip abduction for better strengthening;  Standing with red tband around BLE: Hip flexion SLR x10 bilaterally; Hip extension SLR x10 bilaterally; Patient required min-moderate  verbal/tactile cues for correct exercise technique including cues to avoid trunk lean for better hip strengthening;   Seated on mat table; Green tband around BLE: Hip flexion march x20 with visual cues to increase ROM for better strengthening; Hip hamstring curl x15 bilaterally with cues to increase ROM for better strengthening;    Reinforced importance of HEP compliance/adherence for better strengthening; Patient reports feeling better after session with less discomfort in left knee and less stiffness;                    PT Education - 07/16/17 0941    Education provided  Yes    Education Details  LE strengthening/balance exercise; HEP reinforced;     Person(s) Educated  Patient    Methods  Explanation;Demonstration;Verbal cues    Comprehension  Verbalized understanding;Returned demonstration;Verbal cues required;Need further instruction       PT Short Term Goals - 07/09/17 0939      PT SHORT TERM GOAL #1   Title  Patient will obtain AFO for LLE and be independent in don/doff for safety with gait to improve step length.     Time  4    Period  Weeks    Status  Not Met    Target Date  07/04/17      PT SHORT TERM GOAL #2   Title  Patient will improve LLE hip strength to 3+/5 to improve functional strength for foot clearance for improved gait safety;     Time  4    Period  Weeks    Status  Partially Met    Target Date  07/04/17        PT Long Term Goals - 07/09/17 0939      PT LONG TERM GOAL #1   Title  Patient will be independent in home exercise program to improve strength/mobility for better functional independence with ADLs.    Time  8    Period  Weeks    Status  On-going    Target Date  08/01/17      PT LONG TERM GOAL #2   Title  Patient will increase 10 meter walk test to >0.26ms as to improve gait speed for better community ambulation and to reduce fall risk.    Time  8    Period  Weeks    Status  Partially Met    Target Date  08/01/17       PT LONG TERM GOAL #3   Title  Patient will be independent in sit<>Stand transfers with 1 HHA or less to demonstrate  improved transfer ability and to improve safety at home.     Time  8    Period  Weeks    Status  Partially Met    Target Date  08/01/17      PT LONG TERM GOAL #4   Title  Patient will increase ABC scale score >80% to demonstrate better functional mobility and better confidence with ADLs.     Time  8    Period  Weeks    Status  Not Met    Target Date  08/01/17      PT LONG TERM GOAL #5   Title   Patient will demonstrate an improved Berg Balance Score of > 46/56 as to demonstrate improved balance with ADLs such as sitting/standing and transfer balance and reduced fall risk.     Time  8    Period  Weeks    Status  Not Met    Target Date  08/01/17            Plan - 07/16/17 1001    Clinical Impression Statement  Patient instructed in advanced balance and LE strengthening exercise; He requires cues for correct positioning and trunk control for better balance and strength; Patient does fatigue quickly requiring short rest breaks. He reports increased fatigue and weakness in LLE knee due to OA; Patient also continues to have left foot drop; He has obtained an MD order for AFO but states that he does not want to get that at this time. He would benefit from additional skilled PT intervention to improve balance and strength;     Rehab Potential  Fair    Clinical Impairments Affecting Rehab Potential  no significant change from past rehab; older in age; high fall risk;     PT Frequency  2x / week    PT Duration  12 weeks    PT Treatment/Interventions  ADLs/Self Care Home Management;Cryotherapy;Electrical Stimulation;Moist Heat;Neuromuscular re-education;Balance training;Therapeutic exercise;Therapeutic activities;Functional mobility training;Stair training;Gait training;DME Instruction;Patient/family education;Orthotic Fit/Training;Manual techniques;Energy conservation     PT Next Visit Plan  advance HEP, strengthening, balance    PT Home Exercise Plan  continue as given;     Consulted and Agree with Plan of Care  Patient       Patient will benefit from skilled therapeutic intervention in order to improve the following deficits and impairments:  Abnormal gait, Decreased endurance, Hypomobility, Obesity, Decreased activity tolerance, Decreased strength, Difficulty walking, Decreased mobility, Decreased balance, Decreased safety awareness  Visit Diagnosis: Difficulty in walking, not elsewhere classified  Muscle weakness (generalized)  Unsteadiness on feet     Problem List Patient Active Problem List   Diagnosis Date Noted  . Iliac artery aneurysm ([place]) 09/06/2016  . Essential hypertension, benign 09/06/2016  . Swelling of limb 09/06/2016    Trotter,MargaretPT, DPT 07/16/2017, 10:15 AM  [place] MAIN [facility] SERVICES [address] [place], [place], [address] Phone: [phone]   Fax:  [phone]  Name: [name] Bean MRN: [id number] Date of Birth: 08/02/1935

## 2017-07-18 ENCOUNTER — Ambulatory Visit: Payer: Medicare Other | Admitting: Physical Therapy

## 2017-07-23 ENCOUNTER — Ambulatory Visit: Payer: Medicare Other | Admitting: Physical Therapy

## 2017-08-02 ENCOUNTER — Ambulatory Visit: Payer: Medicare Other | Admitting: Physical Therapy

## 2017-08-09 ENCOUNTER — Ambulatory Visit: Payer: Medicare Other | Admitting: Physical Therapy

## 2017-08-14 ENCOUNTER — Ambulatory Visit: Payer: Medicare Other | Admitting: Physical Therapy

## 2017-08-14 ENCOUNTER — Encounter: Payer: Self-pay | Admitting: Physical Therapy

## 2017-08-14 DIAGNOSIS — R262 Difficulty in walking, not elsewhere classified: Secondary | ICD-10-CM

## 2017-08-14 DIAGNOSIS — M6281 Muscle weakness (generalized): Secondary | ICD-10-CM

## 2017-08-14 DIAGNOSIS — R2681 Unsteadiness on feet: Secondary | ICD-10-CM

## 2017-08-14 NOTE — Therapy (Signed)
[place] MAIN [facility] SERVICES [address] [place], [place], [address] Phone: [phone]   Fax:  [phone]  August 14, 2017   _0 @  Physical Therapy Discharge Summary  Patient: [name] Bean  MRN: [id number]  Date of Birth: 06-11-1935   Diagnosis: Difficulty in walking, not elsewhere classified  Muscle weakness (generalized)  Unsteadiness on feet Referring Provider: Dr. Melrose Nakayama   The above patient had been seen in Physical Therapy 6 times of 17 treatments scheduled with 1 no shows and 3+ cancellations.  The treatment consisted of Therapeutic exercise, balance training, gait training, education on orthotics etc;  The patient is: Unchanged  Subjective: Patient called and requested to stop therapy due to continued left knee pain and discomfort with exercise. He is still trying to move back to [place] and was having difficulty attending all his appointments;   Discharge Findings: unable to obtain objective measures as patient failed to return to therapy;    Functional Status at Discharge: ambulates with SPC with slow gait speed; high fall risk;   Goals Partially Met    Sincerely,   Trotter,Margaret, PT, DPT   CC _1 @  [place], [place], [address] Phone: [phone]   Fax:  [phone]  Patient: Jashaun Penrose  MRN: [id number]  Date of Birth: 1935-05-31

## 2017-08-21 ENCOUNTER — Ambulatory Visit: Payer: Medicare Other | Admitting: Physical Therapy

## 2017-08-29 ENCOUNTER — Ambulatory Visit: Payer: Medicare Other | Admitting: Physical Therapy

## 2017-09-05 ENCOUNTER — Ambulatory Visit: Payer: Medicare Other | Admitting: Physical Therapy

## 2018-05-15 ENCOUNTER — Ambulatory Visit (INDEPENDENT_AMBULATORY_CARE_PROVIDER_SITE_OTHER): Payer: Medicare Other | Admitting: Urology

## 2018-05-15 ENCOUNTER — Encounter: Payer: Self-pay | Admitting: Urology

## 2018-05-15 VITALS — BP 115/71 | HR 78 | Ht 73.0 in | Wt 267.2 lb

## 2018-05-15 DIAGNOSIS — R350 Frequency of micturition: Secondary | ICD-10-CM | POA: Diagnosis not present

## 2018-05-15 DIAGNOSIS — N401 Enlarged prostate with lower urinary tract symptoms: Secondary | ICD-10-CM

## 2018-05-15 NOTE — Progress Notes (Signed)
05/15/2018 11:14 AM   [name] Bean Jan 13, 1935 [id number]  Referring provider: Leonel Ramsay, MD [place], [place] [address]  Chief Complaint  Patient presents with  . Establish Care    HPI: 83 year old male presents to establish a local urologic care.  He has previously been followed by Dr. Jacqlyn Larsen for lower urinary tract symptoms, history of balanitis and hypogonadism.  He has been on tamsulosin in the past however is not presently taking.  He was also on TRT for low energy and discontinued topical testosterone in January 2019 secondary to expense.  He states he has stable lower urinary tract symptoms.  He has urinary frequency, post void dribbling and occasional episodes of urge incontinence.  He states his symptoms are not bothersome enough that he desires further management.  He denies dysuria or gross hematuria.  He denies flank, abdominal, pelvic or scrotal pain.  He last saw Dr. Jacqlyn Larsen in December 2018.   PMH: Past Medical History:  Diagnosis Date  . Arthritis    left knee;   . Hyperlipidemia     Surgical History: No past surgical history on file.  Home Medications:  Allergies as of 05/15/2018      Reactions   Amoxicillin Swelling   Amoxicillin-pot Clavulanate Hives, Shortness Of Breath, Swelling   Pt states he had to go to the hospital because of this situation Pt states he had to go to the hospital because of this situation   Mecobalamin Nausea Only      Medication List        Accurate as of 05/15/18 11:14 AM. Always use your most recent med list.          albuterol 108 (90 Base) MCG/ACT inhaler Commonly known as:  PROVENTIL HFA;VENTOLIN HFA Inhale into the lungs.   AMITIZA 24 MCG capsule Generic drug:  lubiprostone Amitiza 24 mcg capsule   ANDROGEL PUMP 20.25 MG/ACT (1.62%) Gel Generic drug:  Testosterone APPLY 1 PUMP TO EACH SHOULDER ONCE DAILY-2 PUMPS TOTAL   aspirin EC 81 MG  tablet Take 81 mg by mouth.   atorvastatin 80 MG tablet Commonly known as:  LIPITOR atorvastatin 80 mg tablet   azelastine 0.1 % nasal spray Commonly known as:  ASTELIN SPRAY ONCE IN EACH NOSTRIL TWICE DAILY   fluticasone 50 MCG/ACT nasal spray Commonly known as:  FLONASE Place into the nose.   KLOR-CON M20 20 MEQ tablet Generic drug:  potassium chloride SA TAKE 1 TABLET (20 MEQ TOTAL) BY MOUTH 3 (THREE) TIMES DAILY.   lactulose 10 GM/15ML solution Commonly known as:  CHRONULAC TAKE 15 MLS BY MOUTH 2 (TWO) TIMES DAILY   loratadine 10 MG tablet Commonly known as:  CLARITIN TAKE 1 TABLET (10 MG TOTAL) BY MOUTH ONCE DAILY. (NOT COVER)   metolazone 2.5 MG tablet Commonly known as:  ZAROXOLYN TAKE 1 TABLET (2.5 MG TOTAL) BY MOUTH EVERY OTHER DAY.   montelukast 10 MG tablet Commonly known as:  SINGULAIR TAKE 1 TABLET EVERY DAY   multivitamin capsule Take by mouth.   NARCAN 4 MG/0.1ML Liqd nasal spray kit Generic drug:  naloxone PLEASE SEE ATTACHED FOR DETAILED DIRECTIONS   nystatin powder Generic drug:  nystatin Nystop 100,000 unit/gram topical powder   omeprazole 40 MG capsule Commonly known as:  PRILOSEC TAKE ONE CAPSULE BY MOUTH 30 MINS PRIOR TO EVENING MEAL   oxyCODONE-acetaminophen 10-325 MG tablet Commonly known as:  PERCOCET Take by mouth.   simvastatin 10 MG  tablet Commonly known as:  ZOCOR TAKE 1 TABLET (10 MG TOTAL) BY MOUTH NIGHTLY.   STOOL SOFTENER 100 MG capsule Generic drug:  docusate sodium Take by mouth.   torsemide 20 MG tablet Commonly known as:  DEMADEX Take by mouth.   triamcinolone 0.025 % cream Commonly known as:  KENALOG triamcinolone acetonide 0.025 % topical cream   Vitamin D3 1000 units Caps Take by mouth.       Allergies:  Allergies  Allergen Reactions  . Amoxicillin Swelling  . Amoxicillin-Pot Clavulanate Hives, Shortness Of Breath and Swelling    Pt states he had to go to the hospital because of this situation Pt  states he had to go to the hospital because of this situation   . Mecobalamin Nausea Only    Family History: No family history on file.  Social History:  reports that he quit smoking about 37 years ago. He has never used smokeless tobacco. He reports that he does not drink alcohol or use drugs.  ROS: UROLOGY Frequent Urination?: No Hard to postpone urination?: No Burning/pain with urination?: No Get up at night to urinate?: No Leakage of urine?: Yes Urine stream starts and stops?: No Trouble starting stream?: No Do you have to strain to urinate?: Yes Blood in urine?: No Urinary tract infection?: No Sexually transmitted disease?: No Injury to kidneys or bladder?: No Painful intercourse?: No Weak stream?: No Erection problems?: No Penile pain?: No  Gastrointestinal Nausea?: No Vomiting?: No Indigestion/heartburn?: No Diarrhea?: No Constipation?: Yes  Constitutional Fever: No Night sweats?: No Weight loss?: No Fatigue?: No  Skin Skin rash/lesions?: Yes Itching?: No  Eyes Blurred vision?: No Double vision?: No  Ears/Nose/Throat Sore throat?: No Sinus problems?: No  Hematologic/Lymphatic Swollen glands?: No Easy bruising?: Yes  Cardiovascular Leg swelling?: Yes Chest pain?: No  Respiratory Cough?: No Shortness of breath?: No  Endocrine Excessive thirst?: No  Musculoskeletal Back pain?: Yes Joint pain?: Yes  Neurological Headaches?: No Dizziness?: No  Psychologic Depression?: No Anxiety?: No  Physical Exam: BP 115/71 (BP Location: Left Arm, Patient Position: Sitting, Cuff Size: Large)   Pulse 78   Ht _0  (1.854 m)   Wt 267 lb 3.2 oz (121.2 kg)   BMI 35.25 kg/m   Constitutional:  Alert and oriented, No acute distress. HEENT: [place] AT, moist mucus membranes.  Trachea midline, no masses. Cardiovascular: No clubbing, cyanosis, or edema. Respiratory: Normal respiratory effort, no increased work of breathing. GI: Abdomen is soft,  nontender, nondistended, no abdominal masses GU: No CVA tenderness.  Prostate 40 g, smooth without nodules Lymph: No cervical or inguinal lymphadenopathy. Skin: No rashes, bruises or suspicious lesions. Neurologic: Grossly intact, no focal deficits, moving all 4 extremities. Psychiatric: Normal mood and affect.   Assessment & Plan:   83 year old male with mild lower urinary tract symptoms which are not bothersome.  He desires to follow-up annually and was instructed to call earlier for any significant change in his symptoms.  Return in about 1 year (around 05/16/2019) for Recheck.   Abbie Sons, [place] [address] [place], [place] [address] [phone]

## 2018-05-30 ENCOUNTER — Telehealth: Payer: Self-pay | Admitting: Urology

## 2018-05-30 NOTE — Telephone Encounter (Signed)
Please advise 

## 2018-05-30 NOTE — Telephone Encounter (Signed)
Pt would like a prescription for Testosterone Pump Gel 10 mg sent to [facility].  He is a former pt of Jacqlyn Larsen but has seen Stoioff here already.  [phone]

## 2018-05-31 NOTE — Telephone Encounter (Signed)
This patient stopped testosterone replacement in January 2019 due to expense.  He will need a testosterone level and LH before we can restart

## 2018-06-03 ENCOUNTER — Other Ambulatory Visit: Payer: Medicare Other

## 2018-06-03 ENCOUNTER — Telehealth: Payer: Self-pay | Admitting: Urology

## 2018-06-03 DIAGNOSIS — N401 Enlarged prostate with lower urinary tract symptoms: Secondary | ICD-10-CM

## 2018-06-03 DIAGNOSIS — R350 Frequency of micturition: Principal | ICD-10-CM

## 2018-06-03 NOTE — Addendum Note (Signed)
Addended by: Tommy Rainwater on: 06/03/2018 02:46 PM   Modules accepted: Orders

## 2018-06-03 NOTE — Telephone Encounter (Signed)
Can you please put orders in for testosterone and LH per Dr Dene Gentry note.  I made pt lab appt for tomorrow morning.

## 2018-06-04 ENCOUNTER — Other Ambulatory Visit: Payer: Medicare Other

## 2018-06-04 DIAGNOSIS — N401 Enlarged prostate with lower urinary tract symptoms: Secondary | ICD-10-CM

## 2018-06-04 DIAGNOSIS — R350 Frequency of micturition: Principal | ICD-10-CM

## 2018-06-05 LAB — FSH/LH
FSH: 82.6 m[IU]/mL — ABNORMAL HIGH (ref 1.5–12.4)
LH: 42.8 m[IU]/mL — AB (ref 1.7–8.6)

## 2018-06-05 LAB — TESTOSTERONE: TESTOSTERONE: 39 ng/dL — AB (ref 264–916)

## 2018-06-07 ENCOUNTER — Other Ambulatory Visit: Payer: Self-pay | Admitting: Urology

## 2018-06-07 MED ORDER — TESTOSTERONE 50 MG/5GM (1%) TD GEL: 10.0000 g | Freq: Every day | TRANSDERMAL | 1 refills | Status: DC

## 2018-06-07 NOTE — Progress Notes (Signed)
Patient notified and will call back to set up the lab appointment.

## 2018-06-07 NOTE — Progress Notes (Signed)
Testosterone level was low at 39.  Rx testosterone gel was sent to pharmacy.  He will need a follow-up testosterone level and PSA in 6 weeks.

## 2018-06-14 ENCOUNTER — Telehealth: Payer: Self-pay | Admitting: Urology

## 2018-06-14 DIAGNOSIS — N401 Enlarged prostate with lower urinary tract symptoms: Secondary | ICD-10-CM

## 2018-06-14 DIAGNOSIS — E291 Testicular hypofunction: Secondary | ICD-10-CM

## 2018-06-14 NOTE — Telephone Encounter (Signed)
Pt stated he is supposed to have labs drawn 6 weeks after starting testosterone gel, lab appt has been made, however orders need to be entered prior to his appt 11/26.

## 2018-06-16 NOTE — Addendum Note (Signed)
Addended by: Abbie Sons on: 06/16/2018 09:24 AM   Modules accepted: Orders

## 2018-06-27 ENCOUNTER — Telehealth: Payer: Self-pay | Admitting: Family Medicine

## 2018-06-27 NOTE — Telephone Encounter (Signed)
Patient called and states the Androgel is irritating his skin real bad. I informed him to stop using the product. Please advise on what to try next.

## 2018-06-28 NOTE — Telephone Encounter (Signed)
His only other option would be to start.  If he desires will send in Rx.  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

## 2018-06-28 NOTE — Telephone Encounter (Signed)
Spoke to patient and he scheduled an appointment to come in and discuss what he wants to do.

## 2018-07-01 ENCOUNTER — Telehealth: Payer: Self-pay | Admitting: Urology

## 2018-07-01 NOTE — Telephone Encounter (Addendum)
I spoke to pharmacist and [name] Bean went to pharmacist and is having problems with the Andrgel. He used a pump in the past and it was 10mg . He feels like the gel is to strong. He has an appointment to see the dr in AM. I notified the pharmacist not to do anything at this time. Patient stopped the medication.

## 2018-07-01 NOTE — Telephone Encounter (Signed)
Pharmacy lm on vm asking to have someone call them at [phone] in reference to pt medication, did not leave specifics or name of Rx. State they were calling on behalf of the pt and would like a return call. Please advise at [phone]. Thank you.

## 2018-07-02 ENCOUNTER — Encounter: Payer: Self-pay | Admitting: Urology

## 2018-07-02 ENCOUNTER — Ambulatory Visit (INDEPENDENT_AMBULATORY_CARE_PROVIDER_SITE_OTHER): Payer: Medicare Other | Admitting: Urology

## 2018-07-02 VITALS — BP 115/63 | HR 71 | Ht 73.0 in | Wt 266.5 lb

## 2018-07-02 DIAGNOSIS — G4730 Sleep apnea, unspecified: Secondary | ICD-10-CM | POA: Insufficient documentation

## 2018-07-02 DIAGNOSIS — D649 Anemia, unspecified: Secondary | ICD-10-CM | POA: Insufficient documentation

## 2018-07-02 DIAGNOSIS — M48061 Spinal stenosis, lumbar region without neurogenic claudication: Secondary | ICD-10-CM | POA: Insufficient documentation

## 2018-07-02 DIAGNOSIS — Z9889 Other specified postprocedural states: Secondary | ICD-10-CM | POA: Insufficient documentation

## 2018-07-02 DIAGNOSIS — E291 Testicular hypofunction: Secondary | ICD-10-CM

## 2018-07-02 DIAGNOSIS — M549 Dorsalgia, unspecified: Secondary | ICD-10-CM | POA: Insufficient documentation

## 2018-07-02 DIAGNOSIS — E669 Obesity, unspecified: Secondary | ICD-10-CM | POA: Insufficient documentation

## 2018-07-02 DIAGNOSIS — E785 Hyperlipidemia, unspecified: Secondary | ICD-10-CM | POA: Insufficient documentation

## 2018-07-02 DIAGNOSIS — K219 Gastro-esophageal reflux disease without esophagitis: Secondary | ICD-10-CM | POA: Insufficient documentation

## 2018-07-03 ENCOUNTER — Encounter: Payer: Self-pay | Admitting: Urology

## 2018-07-03 NOTE — Progress Notes (Signed)
07/02/2018 6:59 AM   [name] Bean [birth year] [id number]  Referring provider: Baxter Hire, MD [place], [name] [address]  Chief Complaint  Patient presents with  . Hypogonadism    HPI: 83 year old male presents for follow-up of hypogonadism.  I initially saw him in September 2019.  He was a former patient of Dr. Bjorn Bean and had discontinued TRT in January 2019 secondary to expense.  He called back in late September stating he wanted to re-start testosterone and prior to initiating a testosterone level was recommended which was low at 39.  He was started on generic testosterone gel and called back stating the gel was irritating his skin.  He was previously on a 1% pump.  He was using 2 tubes daily which he had previously been using.   PMH: Past Medical History:  Diagnosis Date  . Arthritis    left knee;   . Hyperlipidemia     Surgical History: No past surgical history on file.  Home Medications:  Allergies as of 07/02/2018      Reactions   Amoxicillin Swelling   Amoxicillin-pot Clavulanate Hives, Shortness Of Breath, Swelling   Pt states he had to go to the hospital because of this situation Pt states he had to go to the hospital because of this situation   Mecobalamin Nausea Only      Medication List        Accurate as of 07/02/18 11:59 PM. Always use your most recent med list.          albuterol 108 (90 Base) MCG/ACT inhaler Commonly known as:  PROVENTIL HFA;VENTOLIN HFA Inhale into the lungs.   aspirin EC 81 MG tablet Take 81 mg by mouth.   augmented betamethasone dipropionate 0.05 % ointment Commonly known as:  DIPROLENE-AF APPLY TO AFFECTED AREAS TWICE DAILY UNTIL CLEAR   azelastine 0.1 % nasal spray Commonly known as:  ASTELIN SPRAY ONCE IN EACH NOSTRIL TWICE DAILY   fluticasone 50 MCG/ACT nasal spray Commonly known as:  FLONASE SPRAY 2 SPRAYS INTO EACH NOSTRIL EVERY DAY   KLOR-CON M20 20 MEQ tablet Generic drug:   potassium chloride SA TAKE 1 TABLET (20 MEQ TOTAL) BY MOUTH 3 (THREE) TIMES DAILY.   loratadine 10 MG tablet Commonly known as:  CLARITIN TAKE 1 TABLET (10 MG TOTAL) BY MOUTH ONCE DAILY. (NOT COVER)   montelukast 10 MG tablet Commonly known as:  SINGULAIR TAKE 1 TABLET EVERY DAY   nystatin powder Generic drug:  nystatin Nystop 100,000 unit/gram topical powder   omeprazole 40 MG capsule Commonly known as:  PRILOSEC TAKE ONE CAPSULE BY MOUTH 30 MINS PRIOR TO EVENING MEAL   oxyCODONE-acetaminophen 10-325 MG tablet Commonly known as:  PERCOCET Take by mouth.   STOOL SOFTENER 100 MG capsule Generic drug:  docusate sodium Take by mouth.   testosterone 50 MG/5GM (1%) Gel Commonly known as:  ANDROGEL PLACE 10 G ONTO THE SKIN DAILY.   torsemide 20 MG tablet Commonly known as:  DEMADEX Take by mouth.   triamcinolone 0.025 % cream Commonly known as:  KENALOG triamcinolone acetonide 0.025 % topical cream   Vitamin D3 1000 units Caps Take by mouth.       Allergies:  Allergies  Allergen Reactions  . Amoxicillin Swelling  . Amoxicillin-Pot Clavulanate Hives, Shortness Of Breath and Swelling    Pt states he had to go to the hospital because of this situation Pt states he had to go to the hospital because of this situation   .  Mecobalamin Nausea Only    Family History: No family history on file.  Social History:  reports that he quit smoking about 37 years ago. He has never used smokeless tobacco. He reports that he does not drink alcohol or use drugs.  ROS: UROLOGY Frequent Urination?: No Hard to postpone urination?: No Burning/pain with urination?: No Get up at night to urinate?: No Leakage of urine?: No Urine stream starts and stops?: No Trouble starting stream?: No Do you have to strain to urinate?: No Blood in urine?: No Urinary tract infection?: No Sexually transmitted disease?: No Injury to kidneys or bladder?: No Painful intercourse?: No Weak  stream?: No Erection problems?: No Penile pain?: No  Gastrointestinal Nausea?: No Vomiting?: No Indigestion/heartburn?: No Diarrhea?: No Constipation?: No  Constitutional Fever: No Night sweats?: No Weight loss?: No Fatigue?: No  Skin Skin rash/lesions?: No Itching?: No  Eyes Blurred vision?: No Double vision?: No  Ears/Nose/Throat Sore throat?: No Sinus problems?: No  Hematologic/Lymphatic Swollen glands?: No Easy bruising?: No  Cardiovascular Leg swelling?: No Chest pain?: Yes  Respiratory Cough?: No Shortness of breath?: Yes  Endocrine Excessive thirst?: No  Musculoskeletal Back pain?: No Joint pain?: No  Neurological Headaches?: No Dizziness?: No  Psychologic Depression?: No Anxiety?: No  Physical Exam: BP 115/63 (BP Location: Left Arm, Patient Position: Sitting, Cuff Size: Large)   Pulse 71   Ht 6\' 1"  (1.854 m)   Wt 266 lb 8 oz (120.9 kg)   BMI 35.16 kg/m   Constitutional:  Alert and oriented, No acute distress.   Assessment & Plan:   I recommend he restart at 1 tube daily.  He has about 1 week of his present prescription left at this dose.  If he tolerates this better he will continue and we will recheck a testosterone level in 4-6 weeks.  If he continues to have skin irritation he will call back and would like to try a pump preparation prior to going to injections.  [name] Bean, [place] [address], [place], [place] [address] [phone]

## 2018-07-30 ENCOUNTER — Other Ambulatory Visit: Payer: Medicare Other

## 2018-07-30 DIAGNOSIS — N401 Enlarged prostate with lower urinary tract symptoms: Secondary | ICD-10-CM

## 2018-07-30 DIAGNOSIS — E291 Testicular hypofunction: Secondary | ICD-10-CM

## 2018-07-31 ENCOUNTER — Telehealth: Payer: Self-pay

## 2018-07-31 DIAGNOSIS — E291 Testicular hypofunction: Secondary | ICD-10-CM

## 2018-07-31 LAB — TESTOSTERONE: TESTOSTERONE: 73 ng/dL — AB (ref 264–916)

## 2018-07-31 LAB — PSA: Prostate Specific Ag, Serum: 0.3 ng/mL (ref 0.0–4.0)

## 2018-07-31 NOTE — Telephone Encounter (Signed)
Dr. Bernardo Heater, I spoke with pt about his lab results per pt he is not really using his testosterone gel as he should. He wanted to know if the medication is what you would recommend to improve his number or should he go in a different direction.

## 2018-07-31 NOTE — Telephone Encounter (Signed)
-----   Message from Abbie Sons, MD sent at 07/31/2018  7:00 AM EST ----- Testosterone level remains low at 73.  PSA was 0.3.  Has he been using the testosterone gel as prescribed?

## 2018-07-31 NOTE — Telephone Encounter (Signed)
If he uses the gel as prescribed it should improve his levels.  Would recommend rechecking the testosterone level after he has been using the gel daily for least 4-6 weeks.

## 2018-07-31 NOTE — Telephone Encounter (Signed)
Called pt informed him of the need to use gel daily as prescribed and f/u in 4-6 weeks. Pt gave verbal understanding. Lab orders placed, lab appt scheduled.

## 2018-09-10 ENCOUNTER — Other Ambulatory Visit: Payer: Self-pay

## 2019-01-20 DIAGNOSIS — J45909 Unspecified asthma, uncomplicated: Secondary | ICD-10-CM | POA: Insufficient documentation

## 2019-02-11 ENCOUNTER — Encounter: Payer: Self-pay | Admitting: Urology

## 2019-02-11 ENCOUNTER — Other Ambulatory Visit: Payer: Self-pay

## 2019-02-11 ENCOUNTER — Ambulatory Visit (INDEPENDENT_AMBULATORY_CARE_PROVIDER_SITE_OTHER): Payer: Medicare Other | Admitting: Urology

## 2019-02-11 VITALS — BP 133/73 | HR 88 | Ht 73.0 in | Wt 266.0 lb

## 2019-02-11 DIAGNOSIS — R350 Frequency of micturition: Secondary | ICD-10-CM

## 2019-02-11 DIAGNOSIS — N401 Enlarged prostate with lower urinary tract symptoms: Secondary | ICD-10-CM | POA: Diagnosis not present

## 2019-02-11 LAB — BLADDER SCAN AMB NON-IMAGING

## 2019-02-11 MED ORDER — SULFAMETHOXAZOLE-TRIMETHOPRIM 800-160 MG PO TABS: 1.0000 | ORAL_TABLET | Freq: Two times a day (BID) | ORAL | 0 refills | Status: DC

## 2019-02-11 NOTE — Progress Notes (Signed)
02/11/2019 10:07 AM   [name] Bean [birth year] [id number]  Referring provider: Baxter Hire, MD [place], [place] [address]  Chief Complaint  Patient presents with  . Follow-up    HPI: 84 year old male presents for evaluation of worsening lower urinary tract symptoms.  He presents with a 3-week history of dysuria and increasing urinary frequency, urgency and urge incontinence.  I initially saw him in September 2019 after he transferred care from Dr. Jacqlyn Larsen.  He has a history of BPH with lower urinary tract symptoms and was on tamsulosin and oxybutynin XL.  He is no longer taking tamsulosin but states he is taking the oxybutynin.  He denies fever, chills or gross hematuria.  He also has a history of hypogonadism and has been on AndroGel however he only uses this sporadically.  Levels checked 6 months ago were low and it was stressed if he is to remain on this medication he will only receive benefit if taking regularly.  His AndroGel has not been refilled in 8 months.   PMH: Past Medical History:  Diagnosis Date  . Arthritis    left knee;   . Hyperlipidemia     Surgical History: No past surgical history on file.  Home Medications:  Allergies as of 02/11/2019      Reactions   Amoxicillin Swelling   Amoxicillin-pot Clavulanate Hives, Shortness Of Breath, Swelling   Pt states he had to go to the hospital because of this situation Pt states he had to go to the hospital because of this situation   Mecobalamin Nausea Only      Medication List       Accurate as of February 11, 2019 10:07 AM. If you have any questions, ask your nurse or doctor.        STOP taking these medications   oxyCODONE-acetaminophen 10-325 MG tablet Commonly known as:  PERCOCET Stopped by:  Abbie Sons, MD   tamsulosin 0.4 MG Caps capsule Commonly known as:  FLOMAX Stopped by:  Abbie Sons, MD     TAKE these medications   albuterol 108 (90 Base) MCG/ACT inhaler Commonly  known as:  VENTOLIN HFA Inhale into the lungs.   aspirin EC 81 MG tablet Take 81 mg by mouth.   augmented betamethasone dipropionate 0.05 % ointment Commonly known as:  DIPROLENE-AF APPLY TO AFFECTED AREAS TWICE DAILY UNTIL CLEAR   azelastine 0.1 % nasal spray Commonly known as:  ASTELIN SPRAY ONCE IN EACH NOSTRIL TWICE DAILY   diclofenac sodium 1 % Gel Commonly known as:  VOLTAREN APPLY 2G TO THE AFFECTED AREA 4 TIMES A DAY   fluticasone 50 MCG/ACT nasal spray Commonly known as:  FLONASE SPRAY 2 SPRAYS INTO EACH NOSTRIL EVERY DAY   Klor-Con M20 20 MEQ tablet Generic drug:  potassium chloride SA TAKE 1 TABLET (20 MEQ TOTAL) BY MOUTH 3 (THREE) TIMES DAILY.   levocetirizine 5 MG tablet Commonly known as:  XYZAL Take by mouth.   loratadine 10 MG tablet Commonly known as:  CLARITIN TAKE 1 TABLET (10 MG TOTAL) BY MOUTH ONCE DAILY. (NOT COVER)   meloxicam 15 MG tablet Commonly known as:  MOBIC Take 15 mg by mouth daily.   metolazone 2.5 MG tablet Commonly known as:  ZAROXOLYN Take by mouth.   montelukast 10 MG tablet Commonly known as:  SINGULAIR TAKE 1 TABLET EVERY DAY   nystatin powder Generic drug:  nystatin Nystop 100,000 unit/gram topical powder   omeprazole 40 MG capsule Commonly  known as:  PRILOSEC TAKE ONE CAPSULE BY MOUTH 30 MINS PRIOR TO EVENING MEAL   oxybutynin 5 MG 24 hr tablet Commonly known as:  DITROPAN-XL Take by mouth.   Stool Softener 100 MG capsule Generic drug:  docusate sodium Take by mouth.   testosterone 50 MG/5GM (1%) Gel Commonly known as:  ANDROGEL PLACE 10 G ONTO THE SKIN DAILY.   torsemide 20 MG tablet Commonly known as:  DEMADEX Take by mouth.   triamcinolone 0.025 % cream Commonly known as:  KENALOG triamcinolone acetonide 0.025 % topical cream   Vitamin D3 25 MCG (1000 UT) Caps Take by mouth.       Allergies:  Allergies  Allergen Reactions  . Amoxicillin Swelling  . Amoxicillin-Pot Clavulanate Hives,  Shortness Of Breath and Swelling    Pt states he had to go to the hospital because of this situation Pt states he had to go to the hospital because of this situation   . Mecobalamin Nausea Only    Family History: No family history on file.  Social History:  reports that he quit smoking about 38 years ago. He has never used smokeless tobacco. He reports that he does not drink alcohol or use drugs.  ROS: UROLOGY Frequent Urination?: No Hard to postpone urination?: No Burning/pain with urination?: No Get up at night to urinate?: No Leakage of urine?: No Urine stream starts and stops?: No Trouble starting stream?: No Do you have to strain to urinate?: No Blood in urine?: No Urinary tract infection?: No Sexually transmitted disease?: No Injury to kidneys or bladder?: No Painful intercourse?: No Weak stream?: No Erection problems?: No Penile pain?: No  Gastrointestinal Nausea?: No Vomiting?: No Indigestion/heartburn?: No Diarrhea?: No Constipation?: No  Constitutional Fever: No Night sweats?: No Weight loss?: No Fatigue?: No  Skin Skin rash/lesions?: No Itching?: No  Eyes Blurred vision?: No Double vision?: No  Ears/Nose/Throat Sore throat?: No Sinus problems?: Yes  Hematologic/Lymphatic Swollen glands?: No Easy bruising?: No  Cardiovascular Leg swelling?: Yes Chest pain?: No  Respiratory Cough?: No Shortness of breath?: No  Endocrine Excessive thirst?: No  Musculoskeletal Back pain?: No Joint pain?: Yes  Neurological Headaches?: No Dizziness?: No  Psychologic Depression?: No Anxiety?: No  Physical Exam: BP 133/73 (BP Location: Left Arm, Patient Position: Standing, Cuff Size: Large)   Pulse 88   Ht 6\' 1"  (1.854 m)   Wt 266 lb (120.7 kg)   BMI 35.09 kg/m   Constitutional:  Alert and oriented, No acute distress. HEENT: [place] AT, moist mucus membranes.  Trachea midline, no masses. Cardiovascular: No clubbing, cyanosis, or edema.  Respiratory: Normal respiratory effort, no increased work of breathing. GI: Abdomen is soft, nontender, nondistended, no abdominal masses Skin: No rashes, bruises or suspicious lesions. Neurologic: Grossly intact, no focal deficits, moving all 4 extremities. Psychiatric: Normal mood and affect.  Laboratory Data:  Urinalysis Dipstick trace intact blood 1+ leukocytes Microscopy 6-10 WBC/0-2 RBC  Assessment & Plan:   84 year old male with a three-week history of dysuria and worsening storage related voiding symptoms.  Urinalysis today shows pyuria and a urine culture was ordered.  PVR by bladder scan was 13 mL  Rx Septra DS 1 twice daily was sent to pharmacy pending culture result.  He has a follow-up scheduled September 2020 and was instructed to call for persistent voiding symptoms.  Cystoscopy if urine culture negative.   Abbie Sons, [place] [address] [place], [place] [address] [phone]

## 2019-02-12 LAB — URINALYSIS, COMPLETE
Bilirubin, UA: NEGATIVE
Glucose, UA: NEGATIVE
Ketones, UA: NEGATIVE
Nitrite, UA: NEGATIVE
Protein,UA: NEGATIVE
Specific Gravity, UA: 1.015 (ref 1.005–1.030)
Urobilinogen, Ur: 0.2 mg/dL (ref 0.2–1.0)
pH, UA: 5 (ref 5.0–7.5)

## 2019-02-12 LAB — MICROSCOPIC EXAMINATION

## 2019-02-15 LAB — CULTURE, URINE COMPREHENSIVE

## 2019-02-18 ENCOUNTER — Telehealth: Payer: Self-pay | Admitting: Urology

## 2019-02-18 MED ORDER — CIPROFLOXACIN HCL 250 MG PO TABS: 250.0000 mg | ORAL_TABLET | Freq: Two times a day (BID) | ORAL | 0 refills | Status: DC

## 2019-02-18 MED ORDER — CIPROFLOXACIN HCL 250 MG PO TABS: 500.0000 mg | ORAL_TABLET | Freq: Two times a day (BID) | ORAL | 0 refills | Status: DC

## 2019-02-18 NOTE — Telephone Encounter (Signed)
Urine culture grew enterococcus and was not tested against sulfa.  Please have him discontinue the Septra and Rx Cipro was sent to pharmacy.  Yes, recommend scheduling cystoscopy

## 2019-02-18 NOTE — Telephone Encounter (Signed)
Patient notified And changed to a cysto

## 2019-02-18 NOTE — Telephone Encounter (Signed)
Patient called and stated that his penis is swollen and that is is urinating everywhere. He said the end of his penis is closing up like it is getting smaller so when he urinates it is going all over the place? He is on the schedule for next month but not for a cysto like your last notes stated. Doe she need to be seen sooner and does he need a cysto?   Sharyn Lull

## 2019-02-21 ENCOUNTER — Telehealth: Payer: Self-pay | Admitting: Urology

## 2019-02-21 NOTE — Telephone Encounter (Signed)
Pt called and states that he has taken all of his Ciproflaxin 250 mg, and he is still having symptoms. Difficulty urinating and pain and his stream is not normal. Please advise.

## 2019-02-24 NOTE — Telephone Encounter (Signed)
The patient just completed antibiotics today, do you want his cysto moved up? It is scheduled in September.

## 2019-02-24 NOTE — Telephone Encounter (Signed)
Per [facility] please have this patient's cysto moved up to the next available. Pt will need to be informed of new appt date and time. Thanks

## 2019-02-24 NOTE — Telephone Encounter (Signed)
If he is still having symptoms then move up

## 2019-02-27 ENCOUNTER — Other Ambulatory Visit: Payer: Self-pay

## 2019-02-27 ENCOUNTER — Ambulatory Visit (INDEPENDENT_AMBULATORY_CARE_PROVIDER_SITE_OTHER): Payer: Medicare Other | Admitting: Urology

## 2019-02-27 ENCOUNTER — Encounter: Payer: Self-pay | Admitting: Urology

## 2019-02-27 DIAGNOSIS — N35811 Other urethral stricture, male, meatal: Secondary | ICD-10-CM

## 2019-02-27 DIAGNOSIS — R350 Frequency of micturition: Secondary | ICD-10-CM | POA: Diagnosis not present

## 2019-02-27 DIAGNOSIS — N401 Enlarged prostate with lower urinary tract symptoms: Secondary | ICD-10-CM

## 2019-02-27 LAB — URINALYSIS, COMPLETE
Bilirubin, UA: NEGATIVE
Glucose, UA: NEGATIVE
Nitrite, UA: NEGATIVE
Protein,UA: NEGATIVE
Specific Gravity, UA: 1.02 (ref 1.005–1.030)
Urobilinogen, Ur: 0.2 mg/dL (ref 0.2–1.0)
pH, UA: 5 (ref 5.0–7.5)

## 2019-02-27 LAB — MICROSCOPIC EXAMINATION

## 2019-02-27 NOTE — Progress Notes (Signed)
   02/27/19  CC:  Chief Complaint  Patient presents with  . Cysto    HPI: Mr. Littlejohn was seen on 02/11/2019 complaining of worsening frequency, urgency and dysuria.  He did have pyuria and was started on Cipro however urine culture grew enterococcus and he was switched to Cipro.  He called back complaining of persistent symptoms and decreased force and caliber of urinary stream.  Cystoscopy was scheduled.  There were no vitals taken for this visit. NED. A&Ox3.   No respiratory distress   Abd soft, NT, ND Meatus fused  Cystoscopy Procedure Note  Patient identification was confirmed, informed consent was obtained, and patient was prepped using Betadine solution.  Lidocaine jelly was administered per urethral meatus.     Pre-Procedure: - Inspection reveals a urethral meatus which appears fused.  The urethral meatus was gently dilated with male sounds from 12-20 [place].  Procedure: The flexible cystoscope was then introduced without difficulty - No urethral strictures/lesions are present. 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  He was given a 14 French red rubber catheter to dilate the distal urethra weekly  Return in about 3 months (around 05/30/2019) for Recheck.  Abbie Sons, MD

## 2019-05-20 ENCOUNTER — Ambulatory Visit: Payer: Medicare Other | Admitting: Urology

## 2019-05-21 ENCOUNTER — Ambulatory Visit: Payer: Medicare Other | Admitting: Urology

## 2019-05-22 ENCOUNTER — Other Ambulatory Visit: Payer: Medicare Other | Admitting: Urology

## 2019-05-30 ENCOUNTER — Ambulatory Visit: Payer: Medicare Other | Admitting: Urology

## 2019-06-06 ENCOUNTER — Other Ambulatory Visit: Payer: Self-pay

## 2019-06-06 DIAGNOSIS — Z20822 Contact with and (suspected) exposure to covid-19: Secondary | ICD-10-CM

## 2019-06-07 LAB — NOVEL CORONAVIRUS, NAA: SARS-CoV-2, NAA: NOT DETECTED

## 2019-06-09 ENCOUNTER — Ambulatory Visit (INDEPENDENT_AMBULATORY_CARE_PROVIDER_SITE_OTHER): Payer: Medicare Other | Admitting: Urology

## 2019-06-09 ENCOUNTER — Other Ambulatory Visit: Payer: Self-pay

## 2019-06-09 ENCOUNTER — Encounter: Payer: Self-pay | Admitting: Urology

## 2019-06-09 VITALS — BP 120/74 | HR 88

## 2019-06-09 DIAGNOSIS — N35914 Unspecified anterior urethral stricture, male: Secondary | ICD-10-CM

## 2019-06-09 MED ORDER — OXYBUTYNIN CHLORIDE ER 5 MG PO TB24: 5.0000 mg | ORAL_TABLET | Freq: Every day | ORAL | 11 refills | Status: AC

## 2019-06-09 NOTE — Progress Notes (Signed)
06/09/2019 2:14 PM   [name] Bean 05/05/1935 [id number]  Referring provider: Baxter Hire, MD [place],  [place] [address]  Chief Complaint  Patient presents with  . Benign Prostatic Hypertrophy    HPI: 84 y.o. male with a history of lower urinary tract symptoms.  He was found to have a meatal stricture June 2020 which was dilated.  It was recommended he catheterize the urethral meatus weekly which she was doing but states he has not done in several weeks.  He is voiding with a good stream.  He has intermittent urinary urgency.  He was taking oxybutynin however does not think he is currently taking.  Denies dysuria or gross hematuria.   PMH: Past Medical History:  Diagnosis Date  . Arthritis    left knee;   . Hyperlipidemia     Surgical History: No past surgical history on file.  Home Medications:  Allergies as of 06/09/2019      Reactions   Amoxicillin Swelling   Amoxicillin-pot Clavulanate Hives, Shortness Of Breath, Swelling   Pt states he had to go to the hospital because of this situation Pt states he had to go to the hospital because of this situation   Mecobalamin Nausea Only      Medication List       Accurate as of June 09, 2019  2:14 PM. If you have any questions, ask your nurse or doctor.        STOP taking these medications   ciprofloxacin 250 MG tablet Commonly known as: CIPRO Stopped by: Abbie Sons, MD     TAKE these medications   albuterol 108 (90 Base) MCG/ACT inhaler Commonly known as: VENTOLIN HFA Inhale into the lungs.   aspirin EC 81 MG tablet Take 81 mg by mouth.   augmented betamethasone dipropionate 0.05 % ointment Commonly known as: DIPROLENE-AF APPLY TO AFFECTED AREAS TWICE DAILY UNTIL CLEAR   azelastine 0.1 % nasal spray Commonly known as: ASTELIN SPRAY ONCE IN EACH NOSTRIL TWICE DAILY   diclofenac sodium 1 % Gel Commonly known as: VOLTAREN APPLY 2G TO THE AFFECTED AREA 4 TIMES A DAY    fluticasone 50 MCG/ACT nasal spray Commonly known as: FLONASE SPRAY 2 SPRAYS INTO EACH NOSTRIL EVERY DAY   Klor-Con M20 20 MEQ tablet Generic drug: potassium chloride SA TAKE 1 TABLET (20 MEQ TOTAL) BY MOUTH 3 (THREE) TIMES DAILY.   levocetirizine 5 MG tablet Commonly known as: XYZAL Take by mouth.   loratadine 10 MG tablet Commonly known as: CLARITIN TAKE 1 TABLET (10 MG TOTAL) BY MOUTH ONCE DAILY. (NOT COVER)   meloxicam 15 MG tablet Commonly known as: MOBIC Take 15 mg by mouth daily.   metolazone 2.5 MG tablet Commonly known as: ZAROXOLYN Take by mouth.   montelukast 10 MG tablet Commonly known as: SINGULAIR TAKE 1 TABLET EVERY DAY   mupirocin ointment 2 % Commonly known as: BACTROBAN APPLY TO AFFECTED AREA 3 TIMES A DAY FOR 7 DAYS   nystatin powder Generic drug: nystatin Nystop 100,000 unit/gram topical powder   omeprazole 40 MG capsule Commonly known as: PRILOSEC TAKE ONE CAPSULE BY MOUTH 30 MINS PRIOR TO EVENING MEAL   oxybutynin 5 MG 24 hr tablet Commonly known as: DITROPAN-XL Take by mouth.   oxyCODONE-acetaminophen 10-325 MG tablet Commonly known as: PERCOCET TAKE 1 TABLET BY MOUTH FOUR TIMES A DAY AS NEEDED   Stool Softener 100 MG capsule Generic drug: docusate sodium Take by mouth.   Testosterone 10  MG/ACT (2%) Gel APPLY ONE PUMP TO EACH INNER THIGH DAILY   torsemide 20 MG tablet Commonly known as: DEMADEX Take by mouth.   triamcinolone 0.025 % cream Commonly known as: KENALOG triamcinolone acetonide 0.025 % topical cream   Vitamin D3 25 MCG (1000 UT) Caps Take by mouth.       Allergies:  Allergies  Allergen Reactions  . Amoxicillin Swelling  . Amoxicillin-Pot Clavulanate Hives, Shortness Of Breath and Swelling    Pt states he had to go to the hospital because of this situation Pt states he had to go to the hospital because of this situation   . Mecobalamin Nausea Only    Family History: No family history on file.  Social  History:  reports that he quit smoking about 38 years ago. He has never used smokeless tobacco. He reports that he does not drink alcohol or use drugs.  ROS: UROLOGY Frequent Urination?: No Hard to postpone urination?: No Burning/pain with urination?: No Get up at night to urinate?: No Leakage of urine?: No Urine stream starts and stops?: No Trouble starting stream?: No Do you have to strain to urinate?: No Blood in urine?: No Urinary tract infection?: No Sexually transmitted disease?: No Injury to kidneys or bladder?: No Painful intercourse?: No Weak stream?: No Erection problems?: No Penile pain?: No  Gastrointestinal Nausea?: No Vomiting?: No Indigestion/heartburn?: No Diarrhea?: No Constipation?: No  Constitutional Fever: No Night sweats?: No Weight loss?: No Fatigue?: No  Skin Skin rash/lesions?: No Itching?: Yes  Eyes Blurred vision?: No Double vision?: No  Ears/Nose/Throat Sore throat?: No Sinus problems?: No  Hematologic/Lymphatic Swollen glands?: No Easy bruising?: Yes  Cardiovascular Leg swelling?: Yes Chest pain?: No  Respiratory Cough?: No Shortness of breath?: Yes  Endocrine Excessive thirst?: No  Musculoskeletal Back pain?: Yes Joint pain?: No  Neurological Headaches?: No Dizziness?: No  Psychologic Depression?: No Anxiety?: No  Physical Exam: BP 120/74 (BP Location: Left Arm, Patient Position: Sitting, Cuff Size: Normal)   Pulse 88   Constitutional:  Alert and oriented, No acute distress. HEENT: [place] AT, moist mucus membranes.  Trachea midline, no masses. Cardiovascular: No clubbing, cyanosis, or edema. Respiratory: Normal respiratory effort, no increased work of breathing. GI: Abdomen is soft, nontender, nondistended, no abdominal masses GU: No CVA tenderness Lymph: No cervical or inguinal lymphadenopathy. Skin: No rashes, bruises or suspicious lesions. Neurologic: Grossly intact, no focal deficits, moving all 4  extremities. Psychiatric: Normal mood and affect.   Assessment & Plan:    - Meatal stricture Recommend continuing catheterization of the urethral meatus at least twice monthly and more frequently if he meets resistance.  - Lower urinary tract symptoms He does not think he is taking the oxybutynin 5 mg extended release tablet and desire to restart.  He will call for worsening voiding symptoms and will otherwise follow-up in 1 year.     Abbie Sons, [place] [address], [place], [place] [address] [phone]

## 2019-07-07 ENCOUNTER — Other Ambulatory Visit: Payer: Self-pay

## 2019-07-07 DIAGNOSIS — Z20822 Contact with and (suspected) exposure to covid-19: Secondary | ICD-10-CM

## 2019-07-08 LAB — NOVEL CORONAVIRUS, NAA: SARS-CoV-2, NAA: NOT DETECTED

## 2019-07-28 ENCOUNTER — Other Ambulatory Visit: Payer: Self-pay | Admitting: Urology

## 2019-07-28 DIAGNOSIS — E291 Testicular hypofunction: Secondary | ICD-10-CM

## 2019-07-29 ENCOUNTER — Other Ambulatory Visit: Payer: Self-pay | Admitting: Physician Assistant

## 2019-07-29 ENCOUNTER — Ambulatory Visit
Admission: RE | Admit: 2019-07-29 | Discharge: 2019-07-29 | Disposition: A | Discharge status: Home / Self Care | Payer: Medicare Other | Source: Ambulatory Visit | Attending: Physician Assistant | Admitting: Physician Assistant

## 2019-07-29 ENCOUNTER — Other Ambulatory Visit: Payer: Self-pay

## 2019-07-29 DIAGNOSIS — R202 Paresthesia of skin: Secondary | ICD-10-CM

## 2019-08-15 ENCOUNTER — Other Ambulatory Visit: Payer: Self-pay

## 2019-08-15 DIAGNOSIS — Z20822 Contact with and (suspected) exposure to covid-19: Secondary | ICD-10-CM

## 2019-08-17 LAB — NOVEL CORONAVIRUS, NAA: SARS-CoV-2, NAA: NOT DETECTED

## 2019-10-10 ENCOUNTER — Ambulatory Visit: Payer: Medicare Other | Attending: Internal Medicine

## 2019-10-10 DIAGNOSIS — Z20822 Contact with and (suspected) exposure to covid-19: Secondary | ICD-10-CM

## 2019-10-12 LAB — NOVEL CORONAVIRUS, NAA: SARS-CoV-2, NAA: NOT DETECTED

## 2019-10-31 ENCOUNTER — Ambulatory Visit: Payer: Medicare Other | Attending: Internal Medicine

## 2019-10-31 DIAGNOSIS — Z23 Encounter for immunization: Secondary | ICD-10-CM

## 2019-10-31 NOTE — Progress Notes (Signed)
   Covid-19 Vaccination Clinic  Name:  [name] Bean    MRN: [id number] DOB: [birth year]  10/31/2019  [name] was observed post Covid-19 immunization for 15 minutes without incidence. He was provided with Vaccine Information Sheet and instruction to access the V-Safe system.   [name] was instructed to call 911 with any severe reactions post vaccine: Marland Kitchen Difficulty breathing  . Swelling of your face and throat  . A fast heartbeat  . A bad rash all over your body  . Dizziness and weakness    Immunizations Administered    Name Date Dose VIS Date Route   Pfizer COVID-19 Vaccine 10/31/2019 10:20 AM 0.3 mL 08/15/2019 Intramuscular   Manufacturer: [place]   Lot: [id number]   [place]: [id number]

## 2019-11-25 ENCOUNTER — Ambulatory Visit: Payer: Medicare Other | Attending: Internal Medicine

## 2019-11-25 DIAGNOSIS — Z23 Encounter for immunization: Secondary | ICD-10-CM

## 2019-11-25 NOTE — Progress Notes (Signed)
   Covid-19 Vaccination Clinic  Name:  [name] Bean    MRN: [id number] DOB: [birth year]  11/25/2019  [name] was observed post Covid-19 immunization for 15 minutes without incident. He was provided with Vaccine Information Sheet and instruction to access the V-Safe system.   [name] was instructed to call 911 with any severe reactions post vaccine: Marland Kitchen Difficulty breathing  . Swelling of face and throat  . A fast heartbeat  . A bad rash all over body  . Dizziness and weakness   Immunizations Administered    Name Date Dose VIS Date Route   Pfizer COVID-19 Vaccine 11/25/2019  2:48 PM 0.3 mL 08/15/2019 Intramuscular   Manufacturer: [place]   Lot: [id number]   [place]: [id number]

## 2020-01-08 ENCOUNTER — Other Ambulatory Visit: Payer: Self-pay

## 2020-01-08 ENCOUNTER — Ambulatory Visit (INDEPENDENT_AMBULATORY_CARE_PROVIDER_SITE_OTHER): Payer: Medicare Other | Admitting: Urology

## 2020-01-08 VITALS — BP 125/73 | HR 81 | Ht 73.0 in | Wt 273.0 lb

## 2020-01-08 DIAGNOSIS — N138 Other obstructive and reflux uropathy: Secondary | ICD-10-CM | POA: Diagnosis not present

## 2020-01-08 DIAGNOSIS — N401 Enlarged prostate with lower urinary tract symptoms: Secondary | ICD-10-CM

## 2020-01-08 DIAGNOSIS — N35914 Unspecified anterior urethral stricture, male: Secondary | ICD-10-CM

## 2020-01-08 LAB — BLADDER SCAN AMB NON-IMAGING: Scan Result: 169

## 2020-01-08 MED ORDER — SULFAMETHOXAZOLE-TRIMETHOPRIM 800-160 MG PO TABS: 1.0000 | ORAL_TABLET | Freq: Two times a day (BID) | ORAL | 0 refills | Status: AC

## 2020-01-08 NOTE — Progress Notes (Signed)
01/08/2020 3:25 PM   [name] [birth year] [id number]  Referring provider: Baxter Hire, MD [place],  [place] [address]  Chief Complaint  Patient presents with  . Urinary Frequency    HPI: 86 y.o. male with a history of BPH and meatal stricture status post dilation June 2020 presents with complaints of frequency, urgency and malodorous urine.  Denies fever, chills, nausea or vomiting.  No gross hematuria.  He states he has been catheterizing the meatus intermittently.  He has had slight decrease stream but not as severe as on prior occasions.   PMH: Past Medical History:  Diagnosis Date  . Arthritis    left knee;   . Hyperlipidemia     Surgical History: No past surgical history on file.  Home Medications:  Allergies as of 01/08/2020      Reactions   Amoxicillin Swelling   Amoxicillin-pot Clavulanate Hives, Shortness Of Breath, Swelling   Pt states he had to go to the hospital because of this situation Pt states he had to go to the hospital because of this situation   Mecobalamin Nausea Only      Medication List       Accurate as of Jan 08, 2020  3:25 PM. If you have any questions, ask your nurse or doctor.        albuterol 108 (90 Base) MCG/ACT inhaler Commonly known as: VENTOLIN HFA Inhale into the lungs.   aspirin EC 81 MG tablet Take 81 mg by mouth.   augmented betamethasone dipropionate 0.05 % ointment Commonly known as: DIPROLENE-AF APPLY TO AFFECTED AREAS TWICE DAILY UNTIL CLEAR   azelastine 0.1 % nasal spray Commonly known as: ASTELIN SPRAY ONCE IN EACH NOSTRIL TWICE DAILY   diclofenac sodium 1 % Gel Commonly known as: VOLTAREN APPLY 2G TO THE AFFECTED AREA 4 TIMES A DAY   fexofenadine 180 MG tablet Commonly known as: ALLEGRA Take 180 mg by mouth daily.   fluticasone 50 MCG/ACT nasal spray Commonly known as: FLONASE SPRAY 2 SPRAYS INTO EACH NOSTRIL EVERY DAY   Klor-Con M20 20 MEQ tablet Generic drug: potassium  chloride SA TAKE 1 TABLET (20 MEQ TOTAL) BY MOUTH 3 (THREE) TIMES DAILY.   levocetirizine 5 MG tablet Commonly known as: XYZAL Take by mouth.   loratadine 10 MG tablet Commonly known as: CLARITIN TAKE 1 TABLET (10 MG TOTAL) BY MOUTH ONCE DAILY. (NOT COVER)   magnesium citrate Soln magnesium citrate oral solution  TAKE 296 MLS BY MOUTH ONCE   meloxicam 15 MG tablet Commonly known as: MOBIC Take 15 mg by mouth daily.   metolazone 2.5 MG tablet Commonly known as: ZAROXOLYN Take by mouth.   montelukast 10 MG tablet Commonly known as: SINGULAIR TAKE 1 TABLET EVERY DAY   mupirocin ointment 2 % Commonly known as: BACTROBAN APPLY TO AFFECTED AREA 3 TIMES A DAY FOR 7 DAYS   nystatin powder Generic drug: nystatin Nystop 100,000 unit/gram topical powder   omeprazole 40 MG capsule Commonly known as: PRILOSEC TAKE ONE CAPSULE BY MOUTH 30 MINS PRIOR TO EVENING MEAL   oxybutynin 5 MG 24 hr tablet Commonly known as: DITROPAN-XL Take 1 tablet (5 mg total) by mouth daily.   oxyCODONE-acetaminophen 10-325 MG tablet Commonly known as: PERCOCET TAKE 1 TABLET BY MOUTH FOUR TIMES A DAY AS NEEDED   Stool Softener 100 MG capsule Generic drug: docusate sodium Take by mouth.   tamsulosin 0.4 MG Caps capsule Commonly known as: FLOMAX Take 0.4 mg by mouth  daily.   Testosterone 10 MG/ACT (2%) Gel APPLY ONE PUMP TO EACH INNER THIGH DAILY   torsemide 20 MG tablet Commonly known as: DEMADEX Take by mouth.   triamcinolone 0.025 % cream Commonly known as: KENALOG triamcinolone acetonide 0.025 % topical cream   Vitamin D3 25 MCG (1000 UT) Caps Take by mouth.       Allergies:  Allergies  Allergen Reactions  . Amoxicillin Swelling  . Amoxicillin-Pot Clavulanate Hives, Shortness Of Breath and Swelling    Pt states he had to go to the hospital because of this situation Pt states he had to go to the hospital because of this situation   . Mecobalamin Nausea Only    Family  History: No family history on file.  Social History:  reports that he quit smoking about 39 years ago. He has never used smokeless tobacco. He reports that he does not drink alcohol or use drugs.   Physical Exam: BP 125/73   Pulse 81   Ht 6\' 1"  (1.854 m)   Wt 273 lb (123.8 kg)   BMI 36.02 kg/m   Constitutional:  Alert and oriented, No acute distress. HEENT: [place] AT, moist mucus membranes.  Trachea midline, no masses. Cardiovascular: No clubbing, cyanosis, or edema. Respiratory: Normal respiratory effort, no increased work of breathing. GI: Abdomen is soft, nontender, nondistended, no abdominal masses GU: Upper 1/2 meatus appears fused Skin: No rashes, bruises or suspicious lesions. Neurologic: Grossly intact, no focal deficits, moving all 4 extremities. Psychiatric: Normal mood and affect.   Assessment & Plan:    -BPH with LUTS PVR by bladder scan was 169 mL.    - UTI UA >30 WBCs and nitrite positive.  Urine culture ordered.  Rx Septra DS sent to pharmacy  - Meatal stricture Schedule meatal dilation next week   Abbie Sons, MD  [facility] [address], [place], [place] [address] [phone]

## 2020-01-09 ENCOUNTER — Encounter: Payer: Self-pay | Admitting: Urology

## 2020-01-09 LAB — MICROSCOPIC EXAMINATION: WBC, UA: >30 /hpf — AB (ref 0–5)

## 2020-01-09 LAB — URINALYSIS, COMPLETE
Bilirubin, UA: NEGATIVE
Glucose, UA: NEGATIVE
Ketones, UA: NEGATIVE
Nitrite, UA: POSITIVE — AB
Protein,UA: NEGATIVE
Specific Gravity, UA: 1.02 (ref 1.005–1.030)
Urobilinogen, Ur: 0.2 mg/dL (ref 0.2–1.0)
pH, UA: 7 (ref 5.0–7.5)

## 2020-01-13 LAB — CULTURE, URINE COMPREHENSIVE

## 2020-01-16 ENCOUNTER — Other Ambulatory Visit: Payer: Self-pay

## 2020-01-16 ENCOUNTER — Encounter: Payer: Self-pay | Admitting: Urology

## 2020-01-16 ENCOUNTER — Ambulatory Visit (INDEPENDENT_AMBULATORY_CARE_PROVIDER_SITE_OTHER): Payer: Medicare Other | Admitting: Urology

## 2020-01-16 VITALS — BP 127/75 | HR 79 | Ht 73.0 in | Wt 273.0 lb

## 2020-01-16 DIAGNOSIS — N35914 Unspecified anterior urethral stricture, male: Secondary | ICD-10-CM

## 2020-01-16 MED ORDER — LIDOCAINE HCL URETHRAL/MUCOSAL 2 % EX GEL
1.0000 "application " | Freq: Once | CUTANEOUS | Status: AC
  Administered 2020-01-16: 1 via URETHRAL

## 2020-01-17 ENCOUNTER — Encounter: Payer: Self-pay | Admitting: Urology

## 2020-01-17 NOTE — Progress Notes (Signed)
Procedure note:  -Indications: Distal urethral stricture.  Refer to prior note 01/08/2020.  Urine culture positive Citrobacter sensitive to Septra.  Voiding symptoms have significantly improved.  -Description: He was placed in supine position and external genitalia were prepped and draped.  Using [employer] sounds the distal urethra was dilated without problems from 12-20 [place].  He tolerated the procedure well.  -Plan: Instructed to call for voiding difficulty or fever greater than 101 degrees.  Keep scheduled follow-up   John Giovanni, MD

## 2020-01-23 ENCOUNTER — Other Ambulatory Visit: Payer: Self-pay | Admitting: Urology

## 2020-02-06 ENCOUNTER — Telehealth: Payer: Self-pay | Admitting: Urology

## 2020-02-06 MED ORDER — DOXYCYCLINE HYCLATE 100 MG PO CAPS
100.0000 mg | ORAL_CAPSULE | Freq: Two times a day (BID) | ORAL | 0 refills | Status: DC
Start: 2020-02-06 — End: 2020-10-11

## 2020-02-06 NOTE — Telephone Encounter (Signed)
Additional antibiotic sent.  If symptoms do not improve will need repeat UA/urine culture

## 2020-02-06 NOTE — Telephone Encounter (Signed)
Pt has finished antibiotic, now asking for more due to urine smelling bad. Denies any pain except arthritis pain, nausea, vomiting, fever, chills. Please advise.

## 2020-03-15 ENCOUNTER — Other Ambulatory Visit: Payer: Self-pay

## 2020-03-15 ENCOUNTER — Other Ambulatory Visit: Payer: Self-pay | Admitting: *Deleted

## 2020-03-15 ENCOUNTER — Other Ambulatory Visit: Payer: Medicare Other

## 2020-03-15 DIAGNOSIS — N401 Enlarged prostate with lower urinary tract symptoms: Secondary | ICD-10-CM

## 2020-03-15 DIAGNOSIS — N138 Other obstructive and reflux uropathy: Secondary | ICD-10-CM

## 2020-03-16 LAB — MICROSCOPIC EXAMINATION

## 2020-03-16 LAB — URINALYSIS, COMPLETE
Bilirubin, UA: NEGATIVE
Glucose, UA: NEGATIVE
Nitrite, UA: NEGATIVE
Protein,UA: NEGATIVE
Specific Gravity, UA: 1.015 (ref 1.005–1.030)
Urobilinogen, Ur: 0.2 mg/dL (ref 0.2–1.0)
pH, UA: 5 (ref 5.0–7.5)

## 2020-03-18 LAB — CULTURE, URINE COMPREHENSIVE

## 2020-03-22 ENCOUNTER — Telehealth: Payer: Self-pay | Admitting: *Deleted

## 2020-03-22 NOTE — Telephone Encounter (Signed)
Patient states he is still having a strong smell. He states he is not burning,  Just goes to the restroom a lot more lately.

## 2020-03-22 NOTE — Telephone Encounter (Signed)
-----   Message from Abbie Sons, MD sent at 03/21/2020 10:25 AM EDT ----- Urine culture was positive.  Is he having any symptoms?

## 2020-03-23 NOTE — Telephone Encounter (Signed)
He most likely has bacterial colonization due to incomplete bladder emptying.  Would not recommend treatment unless having significant symptoms as treatment without symptoms will lead to bacterial resistance.  Follow-up as scheduled.  Call earlier for development of worsening symptoms

## 2020-03-23 NOTE — Telephone Encounter (Signed)
Notified patient as instructed, patient pleased. Discussed follow-up appointments, patient agrees  

## 2020-06-10 ENCOUNTER — Ambulatory Visit: Payer: Medicare Other | Admitting: Urology

## 2020-06-10 ENCOUNTER — Other Ambulatory Visit: Payer: Self-pay

## 2020-06-10 ENCOUNTER — Encounter: Payer: Self-pay | Admitting: Urology

## 2020-06-10 ENCOUNTER — Ambulatory Visit (INDEPENDENT_AMBULATORY_CARE_PROVIDER_SITE_OTHER): Payer: Medicare Other | Admitting: Urology

## 2020-06-10 VITALS — BP 126/73 | HR 73 | Ht 72.0 in | Wt 268.0 lb

## 2020-06-10 DIAGNOSIS — N390 Urinary tract infection, site not specified: Secondary | ICD-10-CM | POA: Diagnosis not present

## 2020-06-10 DIAGNOSIS — N35914 Unspecified anterior urethral stricture, male: Secondary | ICD-10-CM | POA: Diagnosis not present

## 2020-06-10 LAB — URINALYSIS, COMPLETE
Bilirubin, UA: NEGATIVE
Glucose, UA: NEGATIVE
Ketones, UA: NEGATIVE
Nitrite, UA: NEGATIVE
Protein,UA: NEGATIVE
RBC, UA: NEGATIVE
Specific Gravity, UA: 1.02 (ref 1.005–1.030)
Urobilinogen, Ur: 1 mg/dL (ref 0.2–1.0)
pH, UA: 8 — ABNORMAL HIGH (ref 5.0–7.5)

## 2020-06-10 LAB — MICROSCOPIC EXAMINATION

## 2020-06-10 LAB — BLADDER SCAN AMB NON-IMAGING: SCA Result: 123

## 2020-06-10 NOTE — Progress Notes (Signed)
06/10/2020 2:35 PM   [name] Bean 1935/06/01 [id number]  Referring provider: Baxter Hire, MD [place],  [place] [address]  Chief Complaint  Patient presents with  . Other    HPI: 85 y.o. male presents for follow-up.  History of meatal stricture, frequency, urgency, urge incontinence and PVR <200 mL   Dilation meatal stricture May 2021 which he states did not significantly improve his symptoms  Denies dysuria or gross hematuria  Denies flank, abdominal or pelvic pain  Frequency, urgency and urge incontinence are most bothersome symptoms  Could not get a urine specimen today and estimated bladder volume 123 mL by bladder scan   PMH: Past Medical History:  Diagnosis Date  . Arthritis    left knee;   . Hyperlipidemia   . Urethral stricture     Surgical History: No past surgical history on file.  Home Medications:  Allergies as of 06/10/2020      Reactions   Amoxicillin Swelling   Amoxicillin-pot Clavulanate Hives, Shortness Of Breath, Swelling   Pt states he had to go to the hospital because of this situation Pt states he had to go to the hospital because of this situation   Mecobalamin Nausea Only      Medication List       Accurate as of June 10, 2020  2:35 PM. If you have any questions, ask your nurse or doctor.        STOP taking these medications   Testosterone 10 MG/ACT (2%) Gel Stopped by: Abbie Sons, MD     TAKE these medications   albuterol 108 (90 Base) MCG/ACT inhaler Commonly known as: VENTOLIN HFA Inhale into the lungs.   aspirin EC 81 MG tablet Take 81 mg by mouth.   augmented betamethasone dipropionate 0.05 % ointment Commonly known as: DIPROLENE-AF APPLY TO AFFECTED AREAS TWICE DAILY UNTIL CLEAR   azelastine 0.1 % nasal spray Commonly known as: ASTELIN SPRAY ONCE IN EACH NOSTRIL TWICE DAILY   cefdinir 300 MG capsule Commonly known as: OMNICEF   Colace 2-IN-1 8.6-50 MG tablet Generic drug:  senna-docusate Colace 2-In-1 8.6 mg-50 mg tablet  Take 2 tablets every day by oral route as needed for 30 days.   diclofenac sodium 1 % Gel Commonly known as: VOLTAREN APPLY 2G TO THE AFFECTED AREA 4 TIMES A DAY   doxycycline 100 MG capsule Commonly known as: VIBRAMYCIN Take 1 capsule (100 mg total) by mouth every 12 (twelve) hours.   fexofenadine 180 MG tablet Commonly known as: ALLEGRA Take 180 mg by mouth daily.   fluticasone 50 MCG/ACT nasal spray Commonly known as: FLONASE SPRAY 2 SPRAYS INTO EACH NOSTRIL EVERY DAY   gabapentin 100 MG capsule Commonly known as: NEURONTIN Take by mouth.   Klor-Con M20 20 MEQ tablet Generic drug: potassium chloride SA TAKE 1 TABLET (20 MEQ TOTAL) BY MOUTH 3 (THREE) TIMES DAILY.   levocetirizine 5 MG tablet Commonly known as: XYZAL Take by mouth.   loratadine 10 MG tablet Commonly known as: CLARITIN TAKE 1 TABLET (10 MG TOTAL) BY MOUTH ONCE DAILY. (NOT COVER)   magnesium citrate Soln magnesium citrate oral solution  TAKE 296 MLS BY MOUTH ONCE   meloxicam 15 MG tablet Commonly known as: MOBIC Take 15 mg by mouth daily.   metolazone 2.5 MG tablet Commonly known as: ZAROXOLYN Take by mouth.   montelukast 10 MG tablet Commonly known as: SINGULAIR TAKE 1 TABLET EVERY DAY   mupirocin ointment 2 % Commonly  known as: BACTROBAN APPLY TO AFFECTED AREA 3 TIMES A DAY FOR 7 DAYS   nystatin powder Generic drug: nystatin Nystop 100,000 unit/gram topical powder   omeprazole 40 MG capsule Commonly known as: PRILOSEC TAKE ONE CAPSULE BY MOUTH 30 MINS PRIOR TO EVENING MEAL   oxyCODONE-acetaminophen 10-325 MG tablet Commonly known as: PERCOCET TAKE 1 TABLET BY MOUTH FOUR TIMES A DAY AS NEEDED   Stool Softener 100 MG capsule Generic drug: docusate sodium Take by mouth.   tamsulosin 0.4 MG Caps capsule Commonly known as: FLOMAX Take 0.4 mg by mouth daily.   torsemide 20 MG tablet Commonly known as: DEMADEX Take by mouth.     triamcinolone 0.025 % cream Commonly known as: KENALOG triamcinolone acetonide 0.025 % topical cream   Vitamin D3 25 MCG (1000 UT) Caps Take by mouth.       Allergies:  Allergies  Allergen Reactions  . Amoxicillin Swelling  . Amoxicillin-Pot Clavulanate Hives, Shortness Of Breath and Swelling    Pt states he had to go to the hospital because of this situation Pt states he had to go to the hospital because of this situation   . Mecobalamin Nausea Only    Family History: No family history on file.  Social History:  reports that he quit smoking about 39 years ago. He has never used smokeless tobacco. He reports that he does not drink alcohol and does not use drugs.   Physical Exam: BP 126/73   Pulse 73   Ht 6' (1.829 m)   Wt 268 lb (121.6 kg)   BMI 36.35 kg/m   Constitutional:  Alert and oriented, No acute distress. HEENT: [place] AT, moist mucus membranes.  Trachea midline, no masses. Cardiovascular: No clubbing, cyanosis, or edema. Respiratory: Normal respiratory effort, no increased work of breathing. GI: Abdomen is soft, nontender, nondistended, no abdominal masses   Assessment & Plan:    1.  Lower urinary tract symptoms  Storage related voiding symptoms most bothersome  Estimated bladder volume 123 mL  Trial Myrbetriq 25 mg daily x4 weeks  Will call back regarding efficacy  PA follow-up 4 months for symptom recheck repeat PVR  Call earlier for worsening voiding symptoms  2.  History of meatal stricture  No significant improvement after dilation   Abbie Sons, MD  [facility] [address], [place], [place] [address] [phone]

## 2020-06-13 LAB — CULTURE, URINE COMPREHENSIVE

## 2020-06-14 ENCOUNTER — Encounter: Payer: Self-pay | Admitting: Urology

## 2020-08-24 DIAGNOSIS — M25561 Pain in right knee: Secondary | ICD-10-CM | POA: Insufficient documentation

## 2020-08-24 DIAGNOSIS — M25562 Pain in left knee: Secondary | ICD-10-CM | POA: Insufficient documentation

## 2020-10-11 ENCOUNTER — Ambulatory Visit (INDEPENDENT_AMBULATORY_CARE_PROVIDER_SITE_OTHER): Payer: Medicare Other | Admitting: Physician Assistant

## 2020-10-11 ENCOUNTER — Other Ambulatory Visit: Payer: Self-pay

## 2020-10-11 ENCOUNTER — Encounter: Payer: Self-pay | Admitting: Physician Assistant

## 2020-10-11 VITALS — BP 120/70 | HR 67 | Ht 74.0 in | Wt 266.0 lb

## 2020-10-11 DIAGNOSIS — R350 Frequency of micturition: Secondary | ICD-10-CM

## 2020-10-11 DIAGNOSIS — N401 Enlarged prostate with lower urinary tract symptoms: Secondary | ICD-10-CM

## 2020-10-11 DIAGNOSIS — N35811 Other urethral stricture, male, meatal: Secondary | ICD-10-CM

## 2020-10-11 DIAGNOSIS — R300 Dysuria: Secondary | ICD-10-CM

## 2020-10-11 DIAGNOSIS — E291 Testicular hypofunction: Secondary | ICD-10-CM | POA: Diagnosis not present

## 2020-10-11 LAB — BLADDER SCAN AMB NON-IMAGING: Scan Result: 69 mL

## 2020-10-11 MED ORDER — MIRABEGRON ER 25 MG PO TB24: 25.0000 mg | ORAL_TABLET | Freq: Every day | ORAL | 0 refills | Status: DC

## 2020-10-11 MED ORDER — SULFAMETHOXAZOLE-TRIMETHOPRIM 800-160 MG PO TABS: 1.0000 | ORAL_TABLET | Freq: Two times a day (BID) | ORAL | 0 refills | Status: AC

## 2020-10-11 NOTE — Progress Notes (Unsigned)
10/11/2020 1:37 PM   [name] Bean 11/15/1934 [id number]  CC: Chief Complaint  Patient presents with  . Follow-up   HPI: [name] Bean is a 86 y.o. male with PMH BPH with frequency, urgency, and urge incontinence previously prescribed Flomax s/p remote TURP by Dr. Jacqlyn Larsen; incomplete bladder emptying; hypogonadism; and meatal stricture s/p dilation in May 2021 without symptomatic improvement who presents today for symptom recheck and PVR.  He saw Dr. Bernardo Heater in clinic most recently on 06/10/2020 and was started on a 1 month trial of Myrbetriq at that time with instructions to follow-up via telephone with symptomatic update, but he did not do this. Per chart review, he was seen by [facility] on 08/16/2020 for routine follow-up of LUTS and hypogonadism and appears to be also a patient of theirs.  History today is very challenging.  Patient doesn't specifically recall being given samples of Myrbetriq and is unsure which medications he is taking. He states he must have finished the samples and he does not think they helped his urinary symptoms, as they persist today.  His med list includes Flomax and oxybutynin XL, as prescribed by [facility]. He reports bothersome dry mouth.  He continues to report bothersome urgency, frequency, and urge incontinence.  He appears to have been prescribed both torsemide and metolazone and states today that he takes his "water pill" late in the day to avoid urination while he is out of the house. He notes that he has BLE edema today and his weight increases when he does not take his diuretic. He feels his frequency is stable regardless of the timing of his diuretic.  He also reports a 1-week history of dysuria. He states that he sounds his urethra per Dr. Bernardo Heater "when [he] thinks [he] need[s] it." He is reusing a urethral dilator that Dr. Bernardo Heater gave him to do this, most recently 2-6 weeks ago. He does not think he is having increased difficulty with sounding at home and  declines physical exam today.  He would prefer to seek urologic care with [place] in [place] so that he does not have to travel to [facility], so long as we prescribe him his supplemental testosterone.  In-office UA today positive for 1+ blood and 2+ leukocyte esterase; urine microscopy with >30 WBCs/HPF, 3-10 RBCs/HPF, and moderate bacteria. PVR 64mL.  PMH: Past Medical History:  Diagnosis Date  . Arthritis    left knee;   . Hyperlipidemia   . Urethral stricture     Surgical History: No past surgical history on file.  Home Medications:  Allergies as of 10/11/2020      Reactions   Amoxicillin Swelling   Amoxicillin-pot Clavulanate Hives, Shortness Of Breath, Swelling   Pt states he had to go to the hospital because of this situation Pt states he had to go to the hospital because of this situation   Mecobalamin Nausea Only      Medication List       Accurate as of October 11, 2020  3:08 PM. If you have any questions, ask your nurse or doctor.        STOP taking these medications   bisacodyl 5 MG EC tablet Commonly known as: DULCOLAX Stopped by: Debroah Loop, PA-C   cefdinir 300 MG capsule Commonly known as: OMNICEF Stopped by: Debroah Loop, PA-C   doxycycline 100 MG capsule Commonly known as: VIBRAMYCIN Stopped by: Debroah Loop, PA-C   fexofenadine 180 MG tablet Commonly known as: ALLEGRA Stopped by: Debroah Loop, PA-C  levocetirizine 5 MG tablet Commonly known as: XYZAL Stopped by: Debroah Loop, PA-C   magnesium citrate Soln Stopped by: Debroah Loop, PA-C   montelukast 10 MG tablet Commonly known as: SINGULAIR Stopped by: Debroah Loop, PA-C   nystatin powder Commonly known as: MYCOSTATIN/NYSTOP Stopped by: Debroah Loop, PA-C   nystatin-triamcinolone ointment Commonly known as: MYCOLOG Stopped by: Debroah Loop, PA-C   oxybutynin 5 MG 24 hr tablet Commonly known as:  DITROPAN-XL Stopped by: Debroah Loop, PA-C   torsemide 20 MG tablet Commonly known as: DEMADEX Stopped by: Debroah Loop, PA-C     TAKE these medications   albuterol 108 (90 Base) MCG/ACT inhaler Commonly known as: VENTOLIN HFA Inhale into the lungs.   aspirin EC 81 MG tablet Take 81 mg by mouth.   augmented betamethasone dipropionate 0.05 % ointment Commonly known as: DIPROLENE-AF APPLY TO AFFECTED AREAS TWICE DAILY UNTIL CLEAR   azelastine 0.1 % nasal spray Commonly known as: ASTELIN SPRAY ONCE IN EACH NOSTRIL TWICE DAILY   diclofenac Sodium 1 % Gel Commonly known as: VOLTAREN APPLY 4 GRAMS TO AFFECTED AREA 4 TIMES A DAY   docusate sodium 100 MG capsule Commonly known as: COLACE Take by mouth.   DULoxetine 20 MG capsule Commonly known as: CYMBALTA Take 20 mg by mouth daily.   fluticasone 50 MCG/ACT nasal spray Commonly known as: FLONASE SPRAY 2 SPRAYS INTO EACH NOSTRIL EVERY DAY   gabapentin 300 MG capsule Commonly known as: NEURONTIN Take 300 mg by mouth 2 (two) times daily. What changed: Another medication with the same name was removed. Continue taking this medication, and follow the directions you see here. Changed by: Debroah Loop, PA-C   loratadine 10 MG tablet Commonly known as: CLARITIN TAKE 1 TABLET (10 MG TOTAL) BY MOUTH ONCE DAILY. (NOT COVER)   meloxicam 15 MG tablet Commonly known as: MOBIC Take 15 mg by mouth daily.   metolazone 2.5 MG tablet Commonly known as: ZAROXOLYN Take 2.5 mg by mouth every other day.   mirabegron ER 25 MG Tb24 tablet Commonly known as: MYRBETRIQ Take 1 tablet (25 mg total) by mouth daily. Started by: Debroah Loop, PA-C   mupirocin ointment 2 % Commonly known as: BACTROBAN APPLY TO AFFECTED AREA 3 TIMES A DAY FOR 7 DAYS   omeprazole 40 MG capsule Commonly known as: PRILOSEC Take 40 mg by mouth daily.   oxyCODONE-acetaminophen 10-325 MG tablet Commonly known as:  PERCOCET Take 1 tablet by mouth 4 (four) times daily as needed.   potassium chloride SA 20 MEQ tablet Commonly known as: KLOR-CON TAKE 1 TABLET (20 MEQ TOTAL) BY MOUTH 3 (THREE) TIMES DAILY.   senna-docusate 8.6-50 MG tablet Commonly known as: Senokot-S Colace 2-In-1 8.6 mg-50 mg tablet  Take 2 tablets every day by oral route as needed for 30 days.   sulfamethoxazole-trimethoprim 800-160 MG tablet Commonly known as: BACTRIM DS Take 1 tablet by mouth 2 (two) times daily for 7 days. Started by: Debroah Loop, PA-C   tamsulosin 0.4 MG Caps capsule Commonly known as: FLOMAX Take 0.4 mg by mouth daily.   Testosterone 10 MG/ACT (2%) Gel 1 PUMP ON EACH INNER THIGH DAILY. 2 PUMPS TOTAL DAILY.   triamcinolone 0.025 % cream Commonly known as: KENALOG triamcinolone acetonide 0.025 % topical cream   Vitamin D3 25 MCG (1000 UT) Caps Take by mouth.       Allergies:  Allergies  Allergen Reactions  . Amoxicillin Swelling  . Amoxicillin-Pot Clavulanate Hives, Shortness Of Breath and Swelling  Pt states he had to go to the hospital because of this situation Pt states he had to go to the hospital because of this situation   . Mecobalamin Nausea Only    Family History: No family history on file.  Social History:   reports that he quit smoking about 40 years ago. He has never used smokeless tobacco. He reports that he does not drink alcohol and does not use drugs.  Physical Exam: BP 120/70 (BP Location: Left Arm, Patient Position: Standing, Cuff Size: Large)   Pulse 67   Ht 6\' 2"  (1.88 m)   Wt 266 lb (120.7 kg)   BMI 34.15 kg/m   Constitutional:  Awake, no acute distress, nontoxic appearing HEENT: [place], AT Cardiovascular: No clubbing, cyanosis. 3+ pitting edema of the BLEs. Respiratory: Normal respiratory effort, no increased work of breathing Skin: No rashes, bruises or suspicious lesions Neurologic: Grossly intact, no focal deficits, moving all 4  extremities Psychiatric: Normal mood and affect  Laboratory Data: Results for orders placed or performed in visit on 10/11/20  Microscopic Examination   Urine  Result Value Ref Range   WBC, UA >30 (A) 0 - 5 /hpf   RBC 3-10 (A) 0 - 2 /hpf   Epithelial Cells (non renal) 0-10 0 - 10 /hpf   Bacteria, UA Moderate (A) None seen/Few  Urinalysis, Complete  Result Value Ref Range   Specific Gravity, UA 1.015 1.005 - 1.030   pH, UA 7.0 5.0 - 7.5   Color, UA Yellow Yellow   Appearance Ur Cloudy (A) Clear   Leukocytes,UA 2+ (A) Negative   Protein,UA Negative Negative/Trace   Glucose, UA Negative Negative   Ketones, UA Negative Negative   RBC, UA 1+ (A) Negative   Bilirubin, UA Negative Negative   Urobilinogen, Ur 0.2 0.2 - 1.0 mg/dL   Nitrite, UA Negative Negative   Microscopic Examination See below:   BLADDER SCAN AMB NON-IMAGING  Result Value Ref Range   Scan Result 69 mL   Assessment & Plan:   86 year old male with a complex urologic history as below. History is very challenging today due to poor health literacy and poor insight versus executive dysfunction. I explained to the patient today that I do not recommend he continue care with two separate Urologic teams due to concerns for treatment redundancy. He wishes to continue care with [facility]. 1. Benign prostatic hyperplasia with urinary frequency Necessary diuresis likely contributory; I am doubtful we can reach his treatment goals in light of this. I am stopping oxybutynin today due to age, xerostomia, and concern for cognitive side effects. I have provided him with Myrbetriq samples as an alternative and will plan for symptom recheck and PVR in 1 month on this medication. I explained that like Flomax, Myrbetriq is a once a day forever medication. - BLADDER SCAN AMB NON-IMAGING - mirabegron ER (MYRBETRIQ) 25 MG TB24 tablet; Take 1 tablet (25 mg total) by mouth daily.  Dispense: 28 tablet; Refill: 0  2. Dysuria UA grossly infected today,  will start empiric Bactrim and send for culture for further evaluation. It is possible that reuse of urethral dilator is contributory here; alternatively patient may have meatal stricture recurrence but I was unable to evaluate him for this today as he declined physical exam. - Urinalysis, Complete - CULTURE, URINE COMPREHENSIVE - sulfamethoxazole-trimethoprim (BACTRIM DS) 800-160 MG tablet; Take 1 tablet by mouth 2 (two) times daily for 7 days.  Dispense: 14 tablet; Refill: 0  3. 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 Bernardo Heater in 1 month.  4. Hypogonadism in male I am concerned about prescribing supplemental testosterone in this patient with a history of poor medical adherence. Will defer to Dr. Bernardo Heater at follow-up, as he has experience managing hypogonadism in this patient.   Return in about 4 weeks (around 11/08/2020) for Symptom recheck and PVR + discuss testosterone with Dr. Bernardo Heater.   I spent 75 minutes on the day of the encounter to include pre-visit record review, face-to-face time with the patient, and post-visit ordering of tests.   Debroah Loop, PA-C  [facility] [address] [place], [place] [address] [phone]

## 2020-10-12 LAB — URINALYSIS, COMPLETE
Bilirubin, UA: NEGATIVE
Glucose, UA: NEGATIVE
Ketones, UA: NEGATIVE
Nitrite, UA: NEGATIVE
Protein,UA: NEGATIVE
Specific Gravity, UA: 1.015 (ref 1.005–1.030)
Urobilinogen, Ur: 0.2 mg/dL (ref 0.2–1.0)
pH, UA: 7 (ref 5.0–7.5)

## 2020-10-12 LAB — MICROSCOPIC EXAMINATION: WBC, UA: >30 /hpf — AB (ref 0–5)

## 2020-10-15 ENCOUNTER — Telehealth: Payer: Self-pay | Admitting: *Deleted

## 2020-10-15 LAB — CULTURE, URINE COMPREHENSIVE

## 2020-10-15 NOTE — Telephone Encounter (Signed)
Patient calling stating that the medication Sam prescribed is making his legs weak and sleepy. I asked the pt to spell out the medication and pt spelled out Cymbalta and I advised pt that Sam did not prescribe that. Pt states whoever prescribed it and he will not take. I told him Sam prescribed Bactrim and Myrbetriq. Pt wasn't familiar with those meds. The leg weakness and sleepiness started today.

## 2020-11-08 ENCOUNTER — Ambulatory Visit (INDEPENDENT_AMBULATORY_CARE_PROVIDER_SITE_OTHER): Payer: Medicare Other | Admitting: Urology

## 2020-11-08 ENCOUNTER — Other Ambulatory Visit: Payer: Self-pay

## 2020-11-08 VITALS — BP 127/77 | HR 76 | Ht 73.0 in | Wt 260.0 lb

## 2020-11-08 DIAGNOSIS — E291 Testicular hypofunction: Secondary | ICD-10-CM

## 2020-11-08 DIAGNOSIS — N3941 Urge incontinence: Secondary | ICD-10-CM

## 2020-11-08 DIAGNOSIS — R350 Frequency of micturition: Secondary | ICD-10-CM

## 2020-11-08 DIAGNOSIS — N401 Enlarged prostate with lower urinary tract symptoms: Secondary | ICD-10-CM | POA: Diagnosis not present

## 2020-11-08 DIAGNOSIS — R8271 Bacteriuria: Secondary | ICD-10-CM

## 2020-11-08 LAB — BLADDER SCAN AMB NON-IMAGING: Scan Result: 57

## 2020-11-08 NOTE — Progress Notes (Signed)
11/08/2020 1:03 PM   [name] 03-20-1935 [id number]  Referring provider: Baxter Hire, MD [place],  [place] [address]  Chief Complaint  Patient presents with  . Other    HPI: 86 y.o. male presents for follow-up visit.   See S. Vaillancourt's office note 10/11/2020  Complains of odor to his urine and urinary incontinence  Denies fever, chills, dysuria  Verified with his pharmacy that tamsulosin and oxybutynin are active medications  He is not taking Myrbetriq  Denies gross hematuria  PMH: Past Medical History:  Diagnosis Date  . Arthritis    left knee;   . Hyperlipidemia   . Urethral stricture     Surgical History: No past surgical history on file.  Home Medications:  Allergies as of 11/08/2020      Reactions   Amoxicillin Swelling   Amoxicillin-pot Clavulanate Hives, Shortness Of Breath, Swelling   Pt states he had to go to the hospital because of this situation Pt states he had to go to the hospital because of this situation   Mecobalamin Nausea Only      Medication List       Accurate as of November 08, 2020  1:03 PM. If you have any questions, ask your nurse or doctor.        albuterol 108 (90 Base) MCG/ACT inhaler Commonly known as: VENTOLIN HFA Inhale into the lungs.   aspirin EC 81 MG tablet Take 81 mg by mouth.   augmented betamethasone dipropionate 0.05 % ointment Commonly known as: DIPROLENE-AF APPLY TO AFFECTED AREAS TWICE DAILY UNTIL CLEAR   azelastine 0.1 % nasal spray Commonly known as: ASTELIN SPRAY ONCE IN EACH NOSTRIL TWICE DAILY   diclofenac Sodium 1 % Gel Commonly known as: VOLTAREN APPLY 4 GRAMS TO AFFECTED AREA 4 TIMES A DAY   docusate sodium 100 MG capsule Commonly known as: COLACE Take by mouth.   DULoxetine 20 MG capsule Commonly known as: CYMBALTA Take 20 mg by mouth daily.   fluticasone 50 MCG/ACT nasal spray Commonly known as: FLONASE SPRAY 2 SPRAYS INTO EACH NOSTRIL EVERY DAY    gabapentin 300 MG capsule Commonly known as: NEURONTIN Take 300 mg by mouth 2 (two) times daily.   loratadine 10 MG tablet Commonly known as: CLARITIN TAKE 1 TABLET (10 MG TOTAL) BY MOUTH ONCE DAILY. (NOT COVER)   meloxicam 15 MG tablet Commonly known as: MOBIC Take 15 mg by mouth daily.   metolazone 2.5 MG tablet Commonly known as: ZAROXOLYN Take 2.5 mg by mouth every other day.   mirabegron ER 25 MG Tb24 tablet Commonly known as: MYRBETRIQ Take 1 tablet (25 mg total) by mouth daily.   mupirocin ointment 2 % Commonly known as: BACTROBAN APPLY TO AFFECTED AREA 3 TIMES A DAY FOR 7 DAYS   omeprazole 40 MG capsule Commonly known as: PRILOSEC Take 40 mg by mouth daily.   oxyCODONE-acetaminophen 10-325 MG tablet Commonly known as: PERCOCET Take 1 tablet by mouth 4 (four) times daily as needed.   potassium chloride SA 20 MEQ tablet Commonly known as: KLOR-CON TAKE 1 TABLET (20 MEQ TOTAL) BY MOUTH 3 (THREE) TIMES DAILY.   senna-docusate 8.6-50 MG tablet Commonly known as: Senokot-S Colace 2-In-1 8.6 mg-50 mg tablet  Take 2 tablets every day by oral route as needed for 30 days.   tamsulosin 0.4 MG Caps capsule Commonly known as: FLOMAX Take 0.4 mg by mouth daily.   Testosterone 10 MG/ACT (2%) Gel 1 PUMP ON EACH INNER  THIGH DAILY. 2 PUMPS TOTAL DAILY.   triamcinolone 0.025 % cream Commonly known as: KENALOG triamcinolone acetonide 0.025 % topical cream   Vitamin D3 25 MCG (1000 UT) Caps Take by mouth.       Allergies:  Allergies  Allergen Reactions  . Amoxicillin Swelling  . Amoxicillin-Pot Clavulanate Hives, Shortness Of Breath and Swelling    Pt states he had to go to the hospital because of this situation Pt states he had to go to the hospital because of this situation   . Mecobalamin Nausea Only    Family History: No family history on file.  Social History:  reports that he quit smoking about 40 years ago. He has never used smokeless tobacco. He  reports that he does not drink alcohol and does not use drugs.   Physical Exam: BP 127/77   Pulse 76   Ht 6\' 1"  (1.854 m)   Wt 260 lb (117.9 kg)   BMI 34.30 kg/m   Constitutional:  Alert and oriented, No acute distress. HEENT: [place] AT, moist mucus membranes.  Trachea midline, no masses. Cardiovascular: No clubbing, cyanosis, or edema. Respiratory: Normal respiratory effort, no increased work of breathing.  Laboratory Data:  Urinalysis Dipstick nitrite positive/2+ leukocyte/trace blood Microscopy >30 WBC/3-10 RBC   Assessment & Plan:    1.  Lower urinary tract symptoms  I had a frank discussion with Mr. Belland and that his urgency and urge incontinence has not improved with alpha blockade, beta 3 agonist and anticholinergic medication.  We discussed ~ 50% of people do not respond to these medications and I do not think he is a candidate for Botox or neuromodulation.  PVR bladder scan today was 59 mL  Urinalysis does show pyuria and a urine culture was ordered  2.  Hypogonadism  He is on a low dose of generic Harlene Salts and has been prescribed by [facility]  If he desires to stop being seen at [facility] will take over this Rx  3.  Chronic bacteriuria  As above   Abbie Sons, MD  [facility] [address], [place], [place] [address] [phone]

## 2020-11-10 LAB — URINALYSIS, COMPLETE
Bilirubin, UA: NEGATIVE
Glucose, UA: NEGATIVE
Nitrite, UA: POSITIVE — AB
Specific Gravity, UA: 1.02 (ref 1.005–1.030)
Urobilinogen, Ur: 1 mg/dL (ref 0.2–1.0)
pH, UA: 8.5 — ABNORMAL HIGH (ref 5.0–7.5)

## 2020-11-10 LAB — CULTURE, URINE COMPREHENSIVE

## 2020-11-10 LAB — MICROSCOPIC EXAMINATION: WBC, UA: >30 /hpf — AB (ref 0–5)

## 2020-11-11 ENCOUNTER — Other Ambulatory Visit: Payer: Self-pay | Admitting: Urology

## 2020-11-11 ENCOUNTER — Telehealth: Payer: Self-pay | Admitting: *Deleted

## 2020-11-11 ENCOUNTER — Encounter: Payer: Self-pay | Admitting: Urology

## 2020-11-11 MED ORDER — NITROFURANTOIN MONOHYD MACRO 100 MG PO CAPS: 100.0000 mg | ORAL_CAPSULE | Freq: Two times a day (BID) | ORAL | 0 refills | Status: DC

## 2020-11-11 NOTE — Telephone Encounter (Signed)
Notified patient as instructed, patient pleased. Discussed follow-up appointments, patient agrees  

## 2020-11-11 NOTE — Telephone Encounter (Signed)
-----   Message from Abbie Sons, MD sent at 11/11/2020  8:24 AM EST ----- Urine culture was positive.  Antibiotic Rx sent to pharmacy

## 2020-11-18 ENCOUNTER — Ambulatory Visit: Payer: Medicare Other | Admitting: Gastroenterology

## 2020-11-23 ENCOUNTER — Encounter: Payer: Self-pay | Admitting: Gastroenterology

## 2020-11-23 ENCOUNTER — Ambulatory Visit (INDEPENDENT_AMBULATORY_CARE_PROVIDER_SITE_OTHER): Payer: Medicare Other | Admitting: Gastroenterology

## 2020-11-23 ENCOUNTER — Other Ambulatory Visit: Payer: Self-pay

## 2020-11-23 VITALS — BP 114/67 | HR 73 | Temp 98.6°F | Ht 73.0 in | Wt 275.4 lb

## 2020-11-23 DIAGNOSIS — R1319 Other dysphagia: Secondary | ICD-10-CM | POA: Diagnosis not present

## 2020-11-23 DIAGNOSIS — D649 Anemia, unspecified: Secondary | ICD-10-CM | POA: Diagnosis not present

## 2020-11-23 NOTE — Progress Notes (Signed)
[name] Bean [address]  [place]  [place], [place] [address]  Main: [phone]  Fax: [phone]   Gastroenterology Consultation  Referring Provider:     Clyde Canterbury, MD Primary Care Physician:  Baxter Hire, MD Reason for Consultation:    Dysphagia        HPI:    Chief Complaint  Patient presents with  . Gastroesophageal Reflux    Patient stated that he has had GERD and dysphagia for a while.    [name] Bean is a 86 y.o. y/o male referred for consultation & management  by Dr. Edwina Barth, Chrystie Nose, MD.  Patient was referred from ENT due to complaints of intermittent dysphagia.  Patient now states that his main issue was pills getting stuck about 1 to 2 months ago.  Denies solid food dysphagia.  However, as per ENT notes this has been an ongoing issue for 3 years.  They were unable to complete a visual exam due to gag reflex and recommended GI evaluation.  Patient denies any weight loss or nausea or vomiting.  Has never had an upper endoscopy.  No family history of colon cancer.  Describes having a colonoscopy a few years ago.  Results not available to [place].  Past Medical History:  Diagnosis Date  . Arthritis    left knee;   . Hyperlipidemia   . Urethral stricture     History reviewed. No pertinent surgical history.  Prior to Admission medications   Medication Sig Start Date End Date Taking? Authorizing Provider  albuterol (PROVENTIL HFA;VENTOLIN HFA) 108 (90 Base) MCG/ACT inhaler Inhale into the lungs.   Yes [provider]  aspirin EC 81 MG tablet Take 81 mg by mouth. 09/16/12  Yes [provider]  augmented betamethasone dipropionate (DIPROLENE-AF) 0.05 % ointment APPLY TO AFFECTED AREAS TWICE DAILY UNTIL CLEAR 06/19/18  Yes [provider]  azelastine (ASTELIN) 0.1 % nasal spray SPRAY ONCE IN EACH NOSTRIL TWICE DAILY 10/07/15  Yes [provider]  bisacodyl (DULCOLAX) 5 MG EC tablet daily.   Yes [provider]   Cholecalciferol (VITAMIN D3) 1000 units CAPS Take by mouth.   Yes [provider]  diclofenac Sodium (VOLTAREN) 1 % GEL APPLY 4 GRAMS TO AFFECTED AREA 4 TIMES A DAY 08/17/20  Yes [provider]  docusate sodium (COLACE) 100 MG capsule Take by mouth.   Yes [provider]  DULoxetine (CYMBALTA) 20 MG capsule Take 20 mg by mouth daily. 10/07/20  Yes [provider]  fluticasone (FLONASE) 50 MCG/ACT nasal spray SPRAY 2 SPRAYS INTO EACH NOSTRIL EVERY DAY 06/14/18  Yes [provider]  gabapentin (NEURONTIN) 300 MG capsule Take 300 mg by mouth 2 (two) times daily. 07/06/20  Yes [provider]  loratadine (CLARITIN) 10 MG tablet TAKE 1 TABLET (10 MG TOTAL) BY MOUTH ONCE DAILY. (NOT COVER) 08/23/16  Yes [provider]  meloxicam (MOBIC) 15 MG tablet Take 15 mg by mouth daily. 01/21/19  Yes [provider]  metolazone (ZAROXOLYN) 2.5 MG tablet Take 2.5 mg by mouth every other day. 01/03/19  Yes [provider]  mirabegron ER (MYRBETRIQ) 25 MG TB24 tablet Take 1 tablet by mouth daily. 10/11/20  Yes [provider]  mupirocin ointment (BACTROBAN) 2 % APPLY TO AFFECTED AREA 3 TIMES A DAY FOR 7 DAYS 04/08/19  Yes [provider]  omeprazole (PRILOSEC) 40 MG capsule Take 40 mg by mouth daily. 11/02/15  Yes [provider]  oxyCODONE-acetaminophen (PERCOCET)  10-325 MG tablet Take 1 tablet by mouth 4 (four) times daily as needed. 04/06/19  Yes [provider]  potassium chloride SA (KLOR-CON) 20 MEQ tablet TAKE 1 TABLET (20 MEQ TOTAL) BY MOUTH 3 (THREE) TIMES DAILY. 09/13/15  Yes [provider]  tamsulosin (FLOMAX) 0.4 MG CAPS capsule Take 0.4 mg by mouth daily. 12/22/19  Yes [provider]  Testosterone 10 MG/ACT (2%) GEL 1 PUMP ON EACH INNER THIGH DAILY. 2 PUMPS TOTAL DAILY. 08/16/20  Yes [provider]  triamcinolone (KENALOG) 0.025 % cream triamcinolone acetonide 0.025 %  topical cream 01/14/14  Yes [provider]    History reviewed. No pertinent family history.   Social History   Tobacco Use  . Smoking status: Former Smoker    Quit date: 1982    Years since quitting: 40.2  . Smokeless tobacco: Never Used  Vaping Use  . Vaping Use: Never used  Substance Use Topics  . Alcohol use: No  . Drug use: No    Allergies as of 11/23/2020 - Review Complete 11/23/2020  Allergen Reaction Noted  . Amoxicillin Swelling 01/01/2015  . Amoxicillin-pot clavulanate Hives, Shortness Of Breath, and Swelling 09/06/2014  . Mecobalamin Nausea Only 03/20/2014    Review of Systems:    All systems reviewed and negative except where noted in HPI.   Physical Exam:  BP 114/67   Pulse 73   Temp 98.6 F (37 C) (Skin)   Ht 6\' 1"  (1.854 m)   Wt 275 lb 6.4 oz (124.9 kg)   BMI 36.33 kg/m  No LMP for male patient. Psych:  Alert and cooperative. Normal mood and affect. General:   Alert,  Well-developed, well-nourished, pleasant and cooperative in NAD Head:  Normocephalic and atraumatic. Eyes:  Sclera clear, no icterus.   Conjunctiva pink. Ears:  Normal auditory acuity. Nose:  No deformity, discharge, or lesions. Mouth:  No deformity or lesions,oropharynx pink & moist. Neck:  Supple; no masses or thyromegaly. Abdomen:  Normal bowel sounds.  No bruits.  Soft, non-tender and non-distended without masses, hepatosplenomegaly or hernias noted.  No guarding or rebound tenderness.    Msk:  Symmetrical without gross deformities. Good, equal movement & strength bilaterally. Pulses:  Normal pulses noted. Extremities:  No clubbing or edema.  No cyanosis. Neurologic:  Alert and oriented x3;  grossly normal neurologically. Skin:  Intact without significant lesions or rashes. No jaundice. Lymph Nodes:  No significant cervical adenopathy. Psych:  Alert and cooperative. Normal mood and affect.   Labs: CBC    Component Value Date/Time   WBC 6.9 07/16/2014 1129   RBC  4.22 (L) 07/16/2014 1129   HGB 12.0 (L) 07/16/2014 1129   HCT 37.0 (L) 07/16/2014 1129   PLT 184 07/16/2014 1129   MCV 88 07/16/2014 1129   MCH 28.4 07/16/2014 1129   MCHC 32.4 07/16/2014 1129   RDW 14.7 (H) 07/16/2014 1129   LYMPHSABS 2.4 07/16/2014 1129   MONOABS 1.0 07/16/2014 1129   EOSABS 0.6 07/16/2014 1129   BASOSABS 0.0 07/16/2014 1129   CMP  No results found for: NA, K, CL, CO2, GLUCOSE, BUN, CREATININE, CALCIUM, PROT, ALBUMIN, AST, ALT, ALKPHOS, BILITOT, GFRNONAA, GFRAA  Imaging Studies: No results found.  Assessment and Plan:   Nelson Noone is a 86 y.o. y/o male has been referred for dysphagia  We discussed further evaluation for his dysphagia with an upper endoscopy given that ENT was unable to complete their visual exam due to gag reflex.  Alternative option such  as imaging studies were also discussed.  Patient agreed to proceed with an upper endoscopy after discussing risks and benefits in detail  However, when staff went to schedule the procedure patient states he does not have a ride and cannot come for his procedure and therefore chose not to schedule  We will try to contact him to see if he would be able to complete an esophagram instead   Labs in [place] does show mild anemia.  We will need to obtain his previous colonoscopy, and obtain anemia work-up on the next visit as well   Dr [name] Bean  Speech recognition software was used to dictate the above note.

## 2020-11-25 ENCOUNTER — Other Ambulatory Visit: Payer: Self-pay | Admitting: Urology

## 2020-11-25 SURGERY — ESOPHAGOGASTRODUODENOSCOPY (EGD) WITH PROPOFOL
Anesthesia: General

## 2020-12-16 ENCOUNTER — Other Ambulatory Visit: Payer: Self-pay

## 2020-12-16 ENCOUNTER — Ambulatory Visit (INDEPENDENT_AMBULATORY_CARE_PROVIDER_SITE_OTHER): Payer: Medicare Other | Admitting: Urology

## 2020-12-16 ENCOUNTER — Encounter: Payer: Self-pay | Admitting: Urology

## 2020-12-16 VITALS — BP 118/71 | HR 86 | Ht 73.0 in | Wt 269.0 lb

## 2020-12-16 DIAGNOSIS — R399 Unspecified symptoms and signs involving the genitourinary system: Secondary | ICD-10-CM | POA: Diagnosis not present

## 2020-12-16 DIAGNOSIS — N390 Urinary tract infection, site not specified: Secondary | ICD-10-CM

## 2020-12-16 DIAGNOSIS — R8271 Bacteriuria: Secondary | ICD-10-CM | POA: Diagnosis not present

## 2020-12-16 LAB — BLADDER SCAN AMB NON-IMAGING: Scan Result: 54

## 2020-12-17 ENCOUNTER — Encounter: Payer: Self-pay | Admitting: Urology

## 2020-12-17 NOTE — Progress Notes (Signed)
12/16/2020 12:18 PM   [name] Bean 23-Jun-1935 [id number]  Referring provider: Baxter Hire, MD [place],  [place] [address]  Chief Complaint  Patient presents with  . Recurrent UTI    Urologic history: 1.  Lower urinary tract symptoms - Chronic urinary frequency, urgency, urge incontinence - No significant provement tamsulosin, oxybutynin and Myrbetriq - Cystoscopy 2020 with meatal stricture.  TUR changes with adenoma regrowth  2.  Chronic bacteriuria - Only complains of malodorous urine  3.  History of hypogonadism - Previously on TRT   HPI: 86 y.o. male called for acute appointment secondary to malodorous urine.   Last seen 11/08/2020  Urine culture + E. coli and treated with antibiotic  Call for appointment stating odor to urine has returned  Denies dysuria  No significant change in frequency with occasional episodes urge incontinence  Denies gross hematuria   PMH: Past Medical History:  Diagnosis Date  . Arthritis    left knee;   . Hyperlipidemia   . Urethral stricture     Surgical History: History reviewed. No pertinent surgical history.  Home Medications:  Allergies as of 12/16/2020      Reactions   Amoxicillin Swelling   Amoxicillin-pot Clavulanate Hives, Shortness Of Breath, Swelling   Pt states he had to go to the hospital because of this situation Pt states he had to go to the hospital because of this situation   Mecobalamin Nausea Only      Medication List       Accurate as of December 16, 2020 11:59 PM. If you have any questions, ask your nurse or doctor.        STOP taking these medications   mirabegron ER 25 MG Tb24 tablet Commonly known as: MYRBETRIQ Stopped by: Abbie Sons, MD     TAKE these medications   albuterol 108 (90 Base) MCG/ACT inhaler Commonly known as: VENTOLIN HFA Inhale into the lungs.   aspirin EC 81 MG tablet Take 81 mg by mouth.   augmented betamethasone dipropionate 0.05 %  ointment Commonly known as: DIPROLENE-AF APPLY TO AFFECTED AREAS TWICE DAILY UNTIL CLEAR   azelastine 0.1 % nasal spray Commonly known as: ASTELIN SPRAY ONCE IN EACH NOSTRIL TWICE DAILY   bisacodyl 5 MG EC tablet Commonly known as: DULCOLAX daily.   diclofenac Sodium 1 % Gel Commonly known as: VOLTAREN APPLY 4 GRAMS TO AFFECTED AREA 4 TIMES A DAY   docusate sodium 100 MG capsule Commonly known as: COLACE Take by mouth.   DULoxetine 20 MG capsule Commonly known as: CYMBALTA Take 20 mg by mouth daily.   fluticasone 50 MCG/ACT nasal spray Commonly known as: FLONASE SPRAY 2 SPRAYS INTO EACH NOSTRIL EVERY DAY   gabapentin 300 MG capsule Commonly known as: NEURONTIN Take 300 mg by mouth 2 (two) times daily.   loratadine 10 MG tablet Commonly known as: CLARITIN TAKE 1 TABLET (10 MG TOTAL) BY MOUTH ONCE DAILY. (NOT COVER)   meloxicam 15 MG tablet Commonly known as: MOBIC Take 15 mg by mouth daily.   metolazone 2.5 MG tablet Commonly known as: ZAROXOLYN Take 2.5 mg by mouth every other day.   mupirocin ointment 2 % Commonly known as: BACTROBAN APPLY TO AFFECTED AREA 3 TIMES A DAY FOR 7 DAYS   omeprazole 40 MG capsule Commonly known as: PRILOSEC Take 40 mg by mouth daily.   oxyCODONE-acetaminophen 10-325 MG tablet Commonly known as: PERCOCET Take 1 tablet by mouth 4 (four) times daily as needed.  potassium chloride SA 20 MEQ tablet Commonly known as: KLOR-CON TAKE 1 TABLET (20 MEQ TOTAL) BY MOUTH 3 (THREE) TIMES DAILY.   tamsulosin 0.4 MG Caps capsule Commonly known as: FLOMAX Take 0.4 mg by mouth daily.   Testosterone 10 MG/ACT (2%) Gel 1 PUMP ON EACH INNER THIGH DAILY. 2 PUMPS TOTAL DAILY.   triamcinolone 0.025 % cream Commonly known as: KENALOG triamcinolone acetonide 0.025 % topical cream   Vitamin D3 25 MCG (1000 UT) Caps Take by mouth.       Allergies:  Allergies  Allergen Reactions  . Amoxicillin Swelling  . Amoxicillin-Pot Clavulanate  Hives, Shortness Of Breath and Swelling    Pt states he had to go to the hospital because of this situation Pt states he had to go to the hospital because of this situation   . Mecobalamin Nausea Only    Family History: History reviewed. No pertinent family history.  Social History:  reports that he quit smoking about 40 years ago. He has never used smokeless tobacco. He reports that he does not drink alcohol and does not use drugs.   Physical Exam: BP 118/71   Pulse 86   Ht 6\' 1"  (1.854 m)   Wt 269 lb (122 kg)   BMI 35.49 kg/m   Constitutional:  Alert, No acute distress. HEENT: [place] AT, moist mucus membranes.  Trachea midline, no masses. Cardiovascular: No clubbing, cyanosis, or edema. Respiratory: Normal respiratory effort, no increased work of breathing. Psychiatric: Normal mood and affect.  Laboratory Data:  Urinalysis Dipstick nitrite +/trace blood/trace leukocytes Microscopy 11-30 WBC/3-10 RBC  Assessment & Plan:    1.  Chronic bacteriuria  He has been treated for malodorous urine which returns and has no other symptoms  Discussed would not recommend treatment of his malodorous urine since that is his only symptom and that he increase his fluid intake  Bladder scan PVR today was 54 mL  Urine culture was ordered in the event he does develop symptoms  Schedule cystoscopy to evaluate for recurrent stricture  2.  Lower urinary tract symptoms  Refractory to multiple medications  Presently on tamsulosin   Abbie Sons, MD  [facility] [address], [place],  [address] [phone]

## 2020-12-20 LAB — URINALYSIS, COMPLETE
Bilirubin, UA: NEGATIVE
Glucose, UA: NEGATIVE
Ketones, UA: NEGATIVE
Nitrite, UA: POSITIVE — AB
Protein,UA: NEGATIVE
Specific Gravity, UA: 1.015 (ref 1.005–1.030)
Urobilinogen, Ur: 0.2 mg/dL (ref 0.2–1.0)
pH, UA: 7.5 (ref 5.0–7.5)

## 2020-12-20 LAB — MICROSCOPIC EXAMINATION

## 2020-12-21 LAB — CULTURE, URINE COMPREHENSIVE

## 2021-01-20 ENCOUNTER — Ambulatory Visit (INDEPENDENT_AMBULATORY_CARE_PROVIDER_SITE_OTHER): Payer: Medicare Other | Admitting: Urology

## 2021-01-20 ENCOUNTER — Encounter: Payer: Self-pay | Admitting: Urology

## 2021-01-20 ENCOUNTER — Other Ambulatory Visit: Payer: Self-pay

## 2021-01-20 VITALS — BP 122/71 | HR 81 | Ht 74.0 in | Wt 265.0 lb

## 2021-01-20 DIAGNOSIS — R8271 Bacteriuria: Secondary | ICD-10-CM

## 2021-01-20 MED ORDER — LEVOFLOXACIN 500 MG PO TABS
500.0000 mg | ORAL_TABLET | Freq: Once | ORAL | Status: AC
  Administered 2021-01-20: 500 mg via ORAL

## 2021-01-20 MED ORDER — METHENAMINE HIPPURATE 1 G PO TABS: 1.0000 g | ORAL_TABLET | Freq: Two times a day (BID) | ORAL | 3 refills | Status: DC

## 2021-01-20 NOTE — Progress Notes (Signed)
   01/20/21  CC:  Chief Complaint  Patient presents with  . Cysto    HPI: Chronic bacteriuria with only complaint malodorous urine, history urethral stricture.  Cystoscopy to evaluate for recurrent stricture  Blood pressure 122/71, pulse 81, height 6\' 2"  (1.88 m), weight 265 lb (120.2 kg). NED. A&Ox3.   No respiratory distress   Abd soft, NT, ND Normal phallus with bilateral descended testicles  Cystoscopy Procedure Note  Patient identification was confirmed, informed consent was obtained, and patient was prepped using Betadine solution.  Lidocaine jelly was administered per urethral meatus.     Pre-Procedure: - Inspection reveals a normal caliber urethral meatus.  Procedure: The flexible cystoscope was introduced without difficulty - No urethral strictures/lesions are present. - TUR changes with adenoma regrowth prostate  - Normal bladder neck - Bilateral ureteral orifices identified - Bladder mucosa  reveals no ulcers, tumors, or lesions - No bladder stones -Moderate trabeculation    Post-Procedure: - Patient tolerated the procedure well  Assessment/ Plan:  Chronic bacteriuria  No evidence of recurrent stricture  Would not recommend treating his bacteriuria for malodorous urine  Trial methenamine  Follow-up scheduled September 2022   Abbie Sons, MD

## 2021-01-21 LAB — MICROSCOPIC EXAMINATION

## 2021-01-21 LAB — URINALYSIS, COMPLETE
Bilirubin, UA: NEGATIVE
Glucose, UA: NEGATIVE
Ketones, UA: NEGATIVE
Nitrite, UA: POSITIVE — AB
Protein,UA: NEGATIVE
Specific Gravity, UA: 1.015 (ref 1.005–1.030)
Urobilinogen, Ur: 0.2 mg/dL (ref 0.2–1.0)
pH, UA: 6 (ref 5.0–7.5)

## 2021-01-24 LAB — CULTURE, URINE COMPREHENSIVE

## 2021-01-25 ENCOUNTER — Telehealth: Payer: Self-pay | Admitting: *Deleted

## 2021-01-25 NOTE — Telephone Encounter (Signed)
Patient called in today and states he is not going to take the Hipex any more. He starts feeling dizzy and make him feel sick feeling.

## 2021-02-23 NOTE — Telephone Encounter (Signed)
Okay to discontinue due to side effects but no  alternative medications to substitute

## 2021-02-23 NOTE — Telephone Encounter (Signed)
Notified patient as instructed,.  

## 2021-02-23 NOTE — Telephone Encounter (Signed)
Patient called the office again today requesting an alternate medication.  Please advise.

## 2021-04-02 ENCOUNTER — Emergency Department: Payer: Medicare Other

## 2021-04-02 ENCOUNTER — Other Ambulatory Visit: Payer: Self-pay

## 2021-04-02 ENCOUNTER — Emergency Department
Admission: EM | Admit: 2021-04-02 | Discharge: 2021-04-02 | Disposition: A | Discharge status: Home / Self Care | Payer: Medicare Other | Attending: Emergency Medicine | Admitting: Emergency Medicine

## 2021-04-02 DIAGNOSIS — S80812A Abrasion, left lower leg, initial encounter: Secondary | ICD-10-CM | POA: Diagnosis not present

## 2021-04-02 DIAGNOSIS — Z7982 Long term (current) use of aspirin: Secondary | ICD-10-CM | POA: Diagnosis not present

## 2021-04-02 DIAGNOSIS — Z87891 Personal history of nicotine dependence: Secondary | ICD-10-CM | POA: Diagnosis not present

## 2021-04-02 DIAGNOSIS — Z85850 Personal history of malignant neoplasm of thyroid: Secondary | ICD-10-CM | POA: Diagnosis not present

## 2021-04-02 DIAGNOSIS — I10 Essential (primary) hypertension: Secondary | ICD-10-CM | POA: Diagnosis not present

## 2021-04-02 DIAGNOSIS — J45909 Unspecified asthma, uncomplicated: Secondary | ICD-10-CM | POA: Diagnosis not present

## 2021-04-02 DIAGNOSIS — Z7952 Long term (current) use of systemic steroids: Secondary | ICD-10-CM | POA: Insufficient documentation

## 2021-04-02 DIAGNOSIS — Y92410 Unspecified street and highway as the place of occurrence of the external cause: Secondary | ICD-10-CM | POA: Diagnosis not present

## 2021-04-02 DIAGNOSIS — Z79899 Other long term (current) drug therapy: Secondary | ICD-10-CM | POA: Insufficient documentation

## 2021-04-02 DIAGNOSIS — S80811A Abrasion, right lower leg, initial encounter: Secondary | ICD-10-CM

## 2021-04-02 DIAGNOSIS — S8991XA Unspecified injury of right lower leg, initial encounter: Secondary | ICD-10-CM | POA: Diagnosis present

## 2021-04-02 MED ORDER — BACITRACIN-NEOMYCIN-POLYMYXIN 400-5-5000 EX OINT
TOPICAL_OINTMENT | Freq: Once | CUTANEOUS | Status: AC
  Administered 2021-04-02: 1 via TOPICAL
  Filled 2021-04-02: qty 1
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
## 2021-04-08 ENCOUNTER — Emergency Department
Admission: EM | Admit: 2021-04-08 | Discharge: 2021-04-08 | Disposition: A | Discharge status: Home / Self Care | Payer: No Typology Code available for payment source | Attending: Emergency Medicine | Admitting: Emergency Medicine

## 2021-04-08 ENCOUNTER — Encounter: Payer: Self-pay | Admitting: Emergency Medicine

## 2021-04-08 ENCOUNTER — Other Ambulatory Visit: Payer: Self-pay

## 2021-04-08 DIAGNOSIS — J45909 Unspecified asthma, uncomplicated: Secondary | ICD-10-CM | POA: Insufficient documentation

## 2021-04-08 DIAGNOSIS — Z79899 Other long term (current) drug therapy: Secondary | ICD-10-CM | POA: Diagnosis not present

## 2021-04-08 DIAGNOSIS — M542 Cervicalgia: Secondary | ICD-10-CM | POA: Insufficient documentation

## 2021-04-08 DIAGNOSIS — Z87891 Personal history of nicotine dependence: Secondary | ICD-10-CM | POA: Diagnosis not present

## 2021-04-08 DIAGNOSIS — Y92410 Unspecified street and highway as the place of occurrence of the external cause: Secondary | ICD-10-CM | POA: Insufficient documentation

## 2021-04-08 DIAGNOSIS — S80811A Abrasion, right lower leg, initial encounter: Secondary | ICD-10-CM | POA: Insufficient documentation

## 2021-04-08 DIAGNOSIS — S80812A Abrasion, left lower leg, initial encounter: Secondary | ICD-10-CM

## 2021-04-08 DIAGNOSIS — Z7982 Long term (current) use of aspirin: Secondary | ICD-10-CM | POA: Insufficient documentation

## 2021-04-08 DIAGNOSIS — I10 Essential (primary) hypertension: Secondary | ICD-10-CM | POA: Diagnosis not present

## 2021-04-08 DIAGNOSIS — S8992XA Unspecified injury of left lower leg, initial encounter: Secondary | ICD-10-CM | POA: Diagnosis present
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
## 2021-04-08 NOTE — Discharge Instructions (Addendum)
Please keep wounds clean and covered.  You may shower as needed.  Cleanse wounds with alcohol daily and apply antibiotic ointment daily.  Return for any increased pain swelling warmth or redness.
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
## 2021-05-02 DIAGNOSIS — L97921 Non-pressure chronic ulcer of unspecified part of left lower leg limited to breakdown of skin: Secondary | ICD-10-CM | POA: Insufficient documentation

## 2021-05-02 DIAGNOSIS — M1711 Unilateral primary osteoarthritis, right knee: Secondary | ICD-10-CM | POA: Insufficient documentation

## 2021-05-12 ENCOUNTER — Encounter: Payer: Self-pay | Admitting: Urology

## 2021-05-12 ENCOUNTER — Other Ambulatory Visit: Payer: Self-pay

## 2021-05-12 ENCOUNTER — Ambulatory Visit (INDEPENDENT_AMBULATORY_CARE_PROVIDER_SITE_OTHER): Payer: Medicare Other | Admitting: Urology

## 2021-05-12 VITALS — BP 139/61 | HR 81 | Ht 72.0 in | Wt 256.0 lb

## 2021-05-12 DIAGNOSIS — R399 Unspecified symptoms and signs involving the genitourinary system: Secondary | ICD-10-CM

## 2021-05-12 DIAGNOSIS — R8271 Bacteriuria: Secondary | ICD-10-CM

## 2021-05-12 LAB — BLADDER SCAN AMB NON-IMAGING: SCA Result: 0

## 2021-05-12 NOTE — Progress Notes (Signed)
05/12/2021 1:21 PM   [name] 1935-05-14 [id number]  Referring provider: Baxter Hire, MD [place],  [place] [address]  Chief Complaint  Patient presents with   Follow-up    Urologic history: 1.  Lower urinary tract symptoms - Chronic urinary frequency, urgency, urge incontinence - No significant provement tamsulosin, oxybutynin and Myrbetriq - Cystoscopy 2020 with meatal stricture.  TUR changes with adenoma regrowth   2.  Chronic bacteriuria - Only complains of malodorous urine   3.  History of hypogonadism - Previously on TRT   HPI: 86 y.o. male presents for follow-up.  Cystoscopy performed last visit which showed no evidence of recurrent stricture Given a trial of methenamine for his malodorous urine however could not tolerate secondary to side effects of dizziness and "feeling sick" Presently not complaining of malodorous urine Overall he is doing well  PMH: Past Medical History:  Diagnosis Date   Arthritis    left knee;    Hyperlipidemia    Urethral stricture     Surgical History: No past surgical history on file.  Home Medications:  Allergies as of 05/12/2021       Reactions   Amoxicillin Swelling   Amoxicillin-pot Clavulanate Hives, Shortness Of Breath, Swelling   Pt states he had to go to the hospital because of this situation Pt states he had to go to the hospital because of this situation   Mecobalamin Nausea Only        Medication List        Accurate as of May 12, 2021  1:21 PM. If you have any questions, ask your nurse or doctor.          albuterol 108 (90 Base) MCG/ACT inhaler Commonly known as: VENTOLIN HFA Inhale into the lungs.   aspirin EC 81 MG tablet Take 81 mg by mouth.   augmented betamethasone dipropionate 0.05 % ointment Commonly known as: DIPROLENE-AF APPLY TO AFFECTED AREAS TWICE DAILY UNTIL CLEAR   azelastine 0.1 % nasal spray Commonly known as: ASTELIN SPRAY ONCE IN EACH  NOSTRIL TWICE DAILY   bisacodyl 5 MG EC tablet Commonly known as: DULCOLAX daily.   diclofenac Sodium 1 % Gel Commonly known as: VOLTAREN APPLY 4 GRAMS TO AFFECTED AREA 4 TIMES A DAY   docusate sodium 100 MG capsule Commonly known as: COLACE Take by mouth.   doxycycline 100 MG capsule Commonly known as: VIBRAMYCIN Take 100 mg by mouth 2 (two) times daily.   DULoxetine 20 MG capsule Commonly known as: CYMBALTA Take 20 mg by mouth daily.   fluticasone 50 MCG/ACT nasal spray Commonly known as: FLONASE SPRAY 2 SPRAYS INTO EACH NOSTRIL EVERY DAY   gabapentin 300 MG capsule Commonly known as: NEURONTIN Take 300 mg by mouth 2 (two) times daily.   loratadine 10 MG tablet Commonly known as: CLARITIN TAKE 1 TABLET (10 MG TOTAL) BY MOUTH ONCE DAILY. (NOT COVER)   meloxicam 15 MG tablet Commonly known as: MOBIC Take 15 mg by mouth daily.   methenamine 1 g tablet Commonly known as: HIPREX Take 1 tablet (1 g total) by mouth 2 (two) times daily with a meal.   metolazone 2.5 MG tablet Commonly known as: ZAROXOLYN Take 2.5 mg by mouth every other day.   mupirocin ointment 2 % Commonly known as: BACTROBAN APPLY TO AFFECTED AREA 3 TIMES A DAY FOR 7 DAYS   omeprazole 40 MG capsule Commonly known as: PRILOSEC Take 40 mg by mouth daily.   oxybutynin 10  MG 24 hr tablet Commonly known as: DITROPAN-XL Take 10 mg by mouth daily.   oxyCODONE-acetaminophen 10-325 MG tablet Commonly known as: PERCOCET Take 1 tablet by mouth 4 (four) times daily as needed.   potassium chloride SA 20 MEQ tablet Commonly known as: KLOR-CON TAKE 1 TABLET (20 MEQ TOTAL) BY MOUTH 3 (THREE) TIMES DAILY.   tamsulosin 0.4 MG Caps capsule Commonly known as: FLOMAX Take 0.4 mg by mouth daily.   triamcinolone 0.025 % cream Commonly known as: KENALOG triamcinolone acetonide 0.025 % topical cream   Vitamin D3 25 MCG (1000 UT) Caps Take by mouth.        Allergies:  Allergies  Allergen Reactions    Amoxicillin Swelling   Amoxicillin-Pot Clavulanate Hives, Shortness Of Breath and Swelling    Pt states he had to go to the hospital because of this situation Pt states he had to go to the hospital because of this situation    Mecobalamin Nausea Only    Family History: No family history on file.  Social History:  reports that he quit smoking about 40 years ago. His smoking use included cigarettes. He has never used smokeless tobacco. He reports that he does not drink alcohol and does not use drugs.   Physical Exam: BP 139/61   Pulse 81   Ht 6' (1.829 m)   Wt 256 lb (116.1 kg)   BMI 34.72 kg/m   Constitutional:  Alert and oriented, No acute distress. HEENT: [place] AT, moist mucus membranes.  Trachea midline, no masses. Cardiovascular: No clubbing, cyanosis, or edema. Respiratory: Normal respiratory effort, no increased work of breathing.   Assessment & Plan:    1. Chronic bacteriuria He was unable to give a urine specimen today and estimated volume by bladder scan was 0 mL  2.  Lower urinary tract symptoms Stable Follow-up 6 months  Abbie Sons, MD  [facility] [address], [place], [place] [address] [phone]

## 2021-05-16 ENCOUNTER — Other Ambulatory Visit: Payer: Medicare Other

## 2021-05-16 ENCOUNTER — Other Ambulatory Visit: Payer: Self-pay

## 2021-05-16 ENCOUNTER — Other Ambulatory Visit: Payer: Self-pay | Admitting: Family Medicine

## 2021-05-16 DIAGNOSIS — R8271 Bacteriuria: Secondary | ICD-10-CM

## 2021-05-17 ENCOUNTER — Encounter: Payer: Medicare Other | Attending: Physician Assistant | Admitting: Physician Assistant

## 2021-05-17 DIAGNOSIS — I872 Venous insufficiency (chronic) (peripheral): Secondary | ICD-10-CM | POA: Diagnosis not present

## 2021-05-17 DIAGNOSIS — L97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 layer exposed: Secondary | ICD-10-CM | POA: Insufficient documentation

## 2021-05-17 DIAGNOSIS — I10 Essential (primary) hypertension: Secondary | ICD-10-CM | POA: Insufficient documentation

## 2021-05-17 DIAGNOSIS — I890 Lymphedema, not elsewhere classified: Secondary | ICD-10-CM | POA: Diagnosis not present

## 2021-05-17 NOTE — Progress Notes (Signed)
BREVAN, DIMATTIA ([id number]) Visit Report for 05/17/2021 Allergy List Details Patient Name: [name] Bean, [name] Bean Date of Service: 05/17/2021 9:45 AM Medical Record Number: [id number] Patient Account Number: [id number] Date of Birth/Sex: 1935-05-04 (86 y.o. M) Treating RN: Donnamarie Poag Primary Care Marca Gadsby: Harrel Lemon Other Clinician: Referring Federica Allport: Milagros Evener Treating Islay Polanco/Extender: Jeri Cos Weeks in Treatment: 0 Allergies Active Allergies amoxicillin Severity: Severe Allergy Notes Electronic Signature(s) Signed: 05/17/2021 3:19:14 PM By: Donnamarie Poag Entered By: Donnamarie Poag on 05/17/2021 10:05:59 [facility], [name] Bean ([id number]) -------------------------------------------------------------------------------- Arrival Information Details Patient Name: [name] Bean Date of Service: 05/17/2021 9:45 AM Medical Record Number: [id number] Patient Account Number: [id number] Date of Birth/Sex: Nov 17, 1934 (86 y.o. M) Treating RN: Donnamarie Poag Primary Care Jatoria Kneeland: Harrel Lemon Other Clinician: Referring Makynlee Kressin: Milagros Evener Treating Chundra Sauerwein/Extender: [name] Bean in Treatment: 0 Visit Information Patient Arrived: Kasandra Knudsen Arrival Time: 09:48 Accompanied By: self Transfer Assistance: None Patient Identification Verified: Yes Secondary Verification Process Completed: Yes Patient Has Alerts: Yes Patient Alerts: Patient on Blood Thinner Aspirin '81mg'$  Electronic Signature(s) Signed: 05/17/2021 3:19:14 PM By: Donnamarie Poag Entered By: Donnamarie Poag on 05/17/2021 09:50:13 [name] Bean ([id number]) -------------------------------------------------------------------------------- Clinic Level of Care Assessment Details Patient Name: [name] Bean Date of Service: 05/17/2021 9:45 AM Medical Record Number: [id number] Patient Account Number: [id number] Date of Birth/Sex: 01-02-1935 (86 y.o. M) Treating RN: Donnamarie Poag Primary Care Hazely Sealey: Harrel Lemon Other Clinician: Referring Taheem Fricke: Milagros Evener Treating Shaneal Barasch/Extender: [name] Bean in Treatment: 0 Clinic Level of Care Assessment Items TOOL 1 Quantity Score '[]'$  - Use when EandM and Procedure is performed on INITIAL visit 0 ASSESSMENTS - Nursing Assessment / Reassessment X - General Physical Exam (combine w/ comprehensive assessment (listed just below) when performed on new 1 20 pt. evals) X- 1 25 Comprehensive Assessment (HX, ROS, Risk Assessments, Wounds Hx, etc.) ASSESSMENTS - Wound and Skin Assessment / Reassessment '[]'$  - Dermatologic / Skin Assessment (not related to wound area) 0 ASSESSMENTS - Ostomy and/or Continence Assessment and Care '[]'$  - Incontinence Assessment and Management 0 '[]'$  - 0 Ostomy Care Assessment and Management (repouching, etc.) PROCESS - Coordination of Care X - Simple Patient / Family Education for ongoing care 1 15 '[]'$  - 0 Complex (extensive) Patient / Family Education for ongoing care X- 1 10 Staff obtains Programmer, systems, Records, Test Results / Process Orders '[]'$  - 0 Staff telephones HHA, Nursing Homes / Clarify orders / etc '[]'$  - 0 Routine Transfer to another Facility (non-emergent condition) '[]'$  - 0 Routine Bean Admission (non-emergent condition) X- 1 15 New Admissions / Biomedical engineer / Ordering NPWT, Apligraf, etc. '[]'$  - 0 Emergency Bean Admission (emergent condition) PROCESS - Special Needs '[]'$  - Pediatric / Minor Patient Management 0 '[]'$  - 0 Isolation Patient Management '[]'$  - 0 Hearing / Language / Visual special needs '[]'$  - 0 Assessment of Community assistance (transportation, D/C planning, etc.) '[]'$  - 0 Additional assistance / Altered mentation '[]'$  - 0 Support Surface(s) Assessment (bed, cushion, seat, etc.) INTERVENTIONS - Miscellaneous '[]'$  - External ear exam 0 '[]'$  - 0 Patient Transfer (multiple staff / Civil Service fast streamer / Similar devices) '[]'$  - 0 Simple Staple / Suture removal (25 or less) '[]'$  - 0 Complex Staple / Suture removal (26 or more) '[]'$  -  0 Hypo/Hyperglycemic Management (do not check if billed separately) X- 1 15 Ankle / Brachial Index (ABI) - do not check if billed separately Has the patient been seen at the [facility] within the last three years: Yes Total Score: 100 Level Of Care: New/Established - Level 3 [name] Bean, [name] Bean (  [id number]) Electronic Signature(s) Signed: 05/17/2021 3:19:14 PM By: Donnamarie Poag Entered By: Donnamarie Poag on 05/17/2021 10:42:00 [name] Bean ([id number]) -------------------------------------------------------------------------------- Compression Therapy Details Patient Name: [name] Bean Date of Service: 05/17/2021 9:45 AM Medical Record Number: [id number] Patient Account Number: [id number] Date of Birth/Sex: 1935-01-29 (86 y.o. M) Treating RN: Donnamarie Poag Primary Care Mirella Gueye: Harrel Lemon Other Clinician: Referring Geneveive Furness: Milagros Evener Treating Ilyaas Musto/Extender: [name] Bean in Treatment: 0 Compression Therapy Performed for Wound Assessment: Wound #1 Left,Distal,Medial Lower Leg Performed By: Clinician Donnamarie Poag, RN Compression Type: Three Layer Post Procedure Diagnosis Same as Pre-procedure Electronic Signature(s) Signed: 05/17/2021 3:19:14 PM By: Donnamarie Poag Entered By: Donnamarie Poag on 05/17/2021 10:42:12 [name] Bean, [name] Bean ([id number]) -------------------------------------------------------------------------------- Encounter Discharge Information Details Patient Name: [name] Bean Date of Service: 05/17/2021 9:45 AM Medical Record Number: [id number] Patient Account Number: [id number] Date of Birth/Sex: July 22, 1935 (86 y.o. M) Treating RN: Donnamarie Poag Primary Care Rachyl Wuebker: Harrel Lemon Other Clinician: Referring Dallys Nowakowski: Milagros Evener Treating Dametrius Sanjuan/Extender: [name] Bean in Treatment: 0 Encounter Discharge Information Items Post Procedure Vitals Discharge Condition: Stable Temperature (F): 98.3 Ambulatory Status: Cane Pulse (bpm): 70 Discharge Destination: Home Respiratory  Rate (breaths/min): 16 Transportation: Private Auto Blood Pressure (mmHg): 109/67 Accompanied By: self Schedule Follow-up Appointment: Yes Clinical Summary of Care: Electronic Signature(s) Signed: 05/17/2021 3:19:14 PM By: Donnamarie Poag Entered By: Donnamarie Poag on 05/17/2021 11:02:15 [name] Bean ([id number]) -------------------------------------------------------------------------------- Lower Extremity Assessment Details Patient Name: [name] Bean Date of Service: 05/17/2021 9:45 AM Medical Record Number: [id number] Patient Account Number: [id number] Date of Birth/Sex: 1935/05/12 (86 y.o. M) Treating RN: Donnamarie Poag Primary Care Cyla Haluska: Harrel Lemon Other Clinician: Referring Marshia Tropea: Milagros Evener Treating Josaphine Shimamoto/Extender: [name] Bean in Treatment: 0 Edema Assessment Assessed: [Left: Yes] [Right: No] Edema: [Left: Ye] [Right: s] Calf Left: Right: Point of Measurement: 40 cm From Medial Instep 48.5 cm Ankle Left: Right: Point of Measurement: 11 cm From Medial Instep 31.5 cm Knee To Floor Left: Right: From Medial Instep 46 cm Vascular Assessment Blood Pressure: Brachial: [Left:102] Ankle: [Left:Dorsalis Pedis: 102 1.00] Electronic Signature(s) Signed: 05/17/2021 3:19:14 PM By: Donnamarie Poag Entered By: Donnamarie Poag on 05/17/2021 10:10:53 [name] Bean ([id number]) -------------------------------------------------------------------------------- Multi Wound Chart Details Patient Name: [name] Bean Date of Service: 05/17/2021 9:45 AM Medical Record Number: [id number] Patient Account Number: [id number] Date of Birth/Sex: Aug 21, 1935 (86 y.o. M) Treating RN: Donnamarie Poag Primary Care Antonella Upson: Harrel Lemon Other Clinician: Referring Evelene Roussin: Milagros Evener Treating Keyira Mondesir/Extender: [name] Bean in Treatment: 0 Vital Signs Height(in): 73 Pulse(bpm): 65 Weight(lbs): 260 Blood Pressure(mmHg): 109/67 Body Mass Index(BMI): 34 Temperature(F): 98.3 Respiratory  Rate(breaths/min): 16 Photos: [N/A:N/A] Wound Location: Left, Distal, Medial Lower Leg N/A N/A Wounding Event: Bump N/A N/A Primary Etiology: Venous Leg Ulcer N/A N/A Secondary Etiology: Lymphedema N/A N/A Comorbid History: Cataracts, Anemia, Asthma, Sleep N/A N/A Apnea, Coronary Artery Disease, Osteoarthritis Date Acquired: 05/03/2021 N/A N/A Weeks of Treatment: 0 N/A N/A Wound Status: Open N/A N/A Clustered Wound: Yes N/A N/A Measurements L x W x D (cm) 3.7x2.6x0.3 N/A N/A Area (cm) : 7.556 N/A N/A Volume (cm) : 2.267 N/A N/A % Reduction in Area: 0.00% N/A N/A % Reduction in Volume: 0.00% N/A N/A Classification: Full Thickness Without Exposed N/A N/A Support Structures Exudate Amount: Medium N/A N/A Exudate Type: Serosanguineous N/A N/A Exudate Color: red, brown N/A N/A [name] Bean, [name] Bean ([id number]) Granulation Amount: Medium (34-66%) N/A N/A Granulation Quality: Red, Pink N/A N/A Necrotic Amount: Medium (34-66%) N/A N/A Exposed Structures: Fat Layer (Subcutaneous Tissue): N/A N/A Yes Fascia: No Tendon: No Muscle: No Joint:  No Bone: No Treatment Notes Electronic Signature(s) Signed: 05/17/2021 3:19:14 PM By: Donnamarie Poag Entered By: Donnamarie Poag on 05/17/2021 10:35:19 [name] Bean ([id number]) -------------------------------------------------------------------------------- Multi-Disciplinary Care Plan Details Patient Name: [name] Bean Date of Service: 05/17/2021 9:45 AM Medical Record Number: [id number] Patient Account Number: [id number] Date of Birth/Sex: Jun 07, 1935 (86 y.o. M) Treating RN: Donnamarie Poag Primary Care Shree Espey: Harrel Lemon Other Clinician: Referring Nasser Ku: Milagros Evener Treating Jereld Presti/Extender: [name] Bean in Treatment: 0 Active Inactive Orientation to the Wound Care Program Nursing Diagnoses: Knowledge deficit related to the wound healing Bean program Goals: Patient/caregiver will verbalize understanding of the [place] Date Initiated: 05/17/2021 Target Resolution Date: 06/03/2021 Goal Status: Active Interventions: Provide education on orientation to the wound Bean Notes: Wound/Skin Impairment Nursing Diagnoses: Impaired tissue integrity Knowledge deficit related to smoking impact on wound healing Knowledge deficit related to ulceration/compromised skin integrity Goals: Ulcer/skin breakdown will have a volume reduction of 30% by week 4 Date Initiated: 05/17/2021 Target Resolution Date: 06/16/2021 Goal Status: Active Ulcer/skin breakdown will have a volume reduction of 50% by week 8 Date Initiated: 05/17/2021 Target Resolution Date: 07/17/2021 Goal Status: Active Ulcer/skin breakdown will have a volume reduction of 80% by week 12 Date Initiated: 05/17/2021 Target Resolution Date: 08/16/2021 Goal Status: Active Ulcer/skin breakdown will heal within 14 weeks Date Initiated: 05/17/2021 Target Resolution Date: 08/30/2021 Goal Status: Active Interventions: Assess patient/caregiver ability to obtain necessary supplies Assess patient/caregiver ability to perform ulcer/skin care regimen upon admission and as needed Assess ulceration(s) every visit Notes: Electronic Signature(s) Signed: 05/17/2021 3:19:14 PM By: Donnamarie Poag Entered By: Donnamarie Poag on 05/17/2021 10:35:09 [name] Bean ([id number]) -------------------------------------------------------------------------------- Pain Assessment Details Patient Name: [name] Bean Date of Service: 05/17/2021 9:45 AM Medical Record Number: [id number] Patient Account Number: [id number] Date of Birth/Sex: [birth year] (86 y.o. M) Treating RN: Donnamarie Poag Primary Care Tyee Vandevoorde: Harrel Lemon Other Clinician: Referring Sebastin Perlmutter: Milagros Evener Treating Jamonta Goerner/Extender: [name] Bean in Treatment: 0 Active Problems Location of Pain Severity and Description of Pain Patient Has Paino Yes Site Locations Pain Location: Generalized Pain Rate the  pain. Current Pain Level: 5 Pain Management and Medication Current Pain Management: Electronic Signature(s) Signed: 05/17/2021 3:19:14 PM By: Donnamarie Poag Entered By: Donnamarie Poag on 05/17/2021 09:50:28 [facility] [place] [facility], [name] Bean ([id number]) -------------------------------------------------------------------------------- Patient/Caregiver Education Details Patient Name: [name] Bean Date of Service: 05/17/2021 9:45 AM Medical Record Number: [id number] Patient Account Number: [id number] Date of Birth/Gender: Feb 16, 1935 (86 y.o. M) Treating RN: Donnamarie Poag Primary Care Physician: Harrel Lemon Other Clinician: Referring Physician: Milagros Evener Treating Physician/Extender: [name] Bean in Treatment: 0 Education Assessment Education Provided To: Patient Education Topics Provided Basic Hygiene: Nutrition: Venous: [facility] [place]: Wound Debridement: Wound/Skin Impairment: Electronic Signature(s) Signed: 05/17/2021 3:19:14 PM By: Donnamarie Poag Entered By: Donnamarie Poag on 05/17/2021 10:35:36 [name] Bean, [name] Bean ([id number]) -------------------------------------------------------------------------------- Wound Assessment Details Patient Name: [name] Bean Date of Service: 05/17/2021 9:45 AM Medical Record Number: [id number] Patient Account Number: [id number] Date of Birth/Sex: 1935-03-19 (86 y.o. M) Treating RN: Donnamarie Poag Primary Care Sabir Charters: Harrel Lemon Other Clinician: Referring Calvina Liptak: Milagros Evener Treating Azelie Noguera/Extender: [name] Bean in Treatment: 0 Wound Status Wound Number: 1 Primary Trauma, Other Etiology: Wound Location: Left, Distal, Medial Lower Leg Secondary Lymphedema Wounding Event: Bump Etiology: Date Acquired: 05/03/2021 Wound Status: Open Weeks Of Treatment: 0 Comorbid Cataracts, Anemia, Asthma, Sleep Apnea, Coronary Clustered Wound: Yes History: Artery Disease, Osteoarthritis Photos Wound Measurements Length: (cm) 3.7 Width: (cm) 2.6 Depth: (cm)  0.3 Area: (cm) 7.556 Volume: (cm) 2.267 % Reduction in  Area: 0% % Reduction in Volume: 0% Tunneling: No Undermining: No Wound Description Classification: Full Thickness Without Exposed Support Structu Exudate Amount: Medium Exudate Type: Serosanguineous Exudate Color: red, brown res Foul Odor After Cleansing: No Wound Bed Granulation Amount: Medium (34-66%) Exposed Structure Granulation Quality: Red, Pink Fascia Exposed: No Necrotic Amount: Medium (34-66%) Fat Layer (Subcutaneous Tissue) Exposed: Yes Necrotic Quality: Adherent Slough Tendon Exposed: No Muscle Exposed: No Joint Exposed: No [name] Bean, [name] Bean ([id number]) Bone Exposed: No Treatment Notes Wound #1 (Lower Leg) Wound Laterality: Left, Medial, Distal Cleanser Soap and Water Discharge Instruction: Gently cleanse wound with antibacterial soap, rinse and pat dry prior to dressing wounds Peri-Wound Care Topical Primary Dressing Hydrofera Blue Ready Transfer Foam, 2.5x2.5 (in/in) Discharge Instruction: Apply Hydrofera Blue Ready to wound bed as directed Secondary Dressing ABD Pad 5x9 (in/in) Discharge Instruction: Cover with ABD pad Secured With Compression Wrap Profore Lite LF 3 Multilayer Compression Bandaging System Discharge Instruction: Apply 3 multi-layer wrap as prescribed. Compression Stockings Add-Ons Electronic Signature(s) Signed: 05/17/2021 3:19:14 PM By: Donnamarie Poag Entered By: Donnamarie Poag on 05/17/2021 10:47:32 [facility], [name] Bean ([id number]) -------------------------------------------------------------------------------- [place] Details Patient Name: [name] Bean Date of Service: 05/17/2021 9:45 AM Medical Record Number: [id number] Patient Account Number: [id number] Date of Birth/Sex: [birth year] (86 y.o. M) Treating RN: Donnamarie Poag Primary Care Luane Rochon: Harrel Lemon Other Clinician: Referring Yandel Zeiner: Milagros Evener Treating Shaili Donalson/Extender: [name] Bean in Treatment: 0 Vital Signs Time Taken:  09:48 Temperature (F): 98.3 Height (in): 73 Pulse (bpm): 70 Source: Stated Respiratory Rate (breaths/min): 16 Weight (lbs): 260 Blood Pressure (mmHg): 109/67 Source: Measured Reference Range: 80 - 120 mg / dl Body Mass Index (BMI): 34.3 Electronic Signature(s) Signed: 05/17/2021 3:19:14 PM By: Donnamarie Poag Entered ByDonnamarie Poag on 05/17/2021 09:51:45

## 2021-05-17 NOTE — Progress Notes (Signed)
JADD, LINDEMANN ([id number]) Visit Report for 05/17/2021 Abuse/Suicide Risk Screen Details Patient Name: [name] Bean, [name] Bean Date of Service: 05/17/2021 9:45 AM Medical Record Number: [id number] Patient Account Number: [id number] Date of Birth/Sex: 08/23/1935 (87 y.o. M) Treating RN: Donnamarie Poag Primary Care Ketan Renz: Harrel Lemon Other Clinician: Referring Lulu Hirschmann: Milagros Evener Treating Haevyn Ury/Extender: Skipper Cliche in Treatment: 0 Abuse/Suicide Risk Screen Items Answer ABUSE RISK SCREEN: Has anyone close to you tried to hurt or harm you recentlyo No Do you feel uncomfortable with anyone in your familyo No Has anyone forced you do things that you didnot want to doo No Electronic Signature(s) Signed: 05/17/2021 3:19:14 PM By: Donnamarie Poag Entered By: Donnamarie Poag on 05/17/2021 09:56:28 [name] Bean ([id number]) -------------------------------------------------------------------------------- Activities of Daily Living Details Patient Name: [name] Bean Date of Service: 05/17/2021 9:45 AM Medical Record Number: [id number] Patient Account Number: [id number] Date of Birth/Sex: 1935/02/05 (86 y.o. M) Treating RN: Donnamarie Poag Primary Care Judaea Burgoon: Harrel Lemon Other Clinician: Referring Verner Mccrone: Milagros Evener Treating Jaxn Chiquito/Extender: Skipper Cliche in Treatment: 0 Activities of Daily Living Items Answer Activities of Daily Living (Please select one for each item) Drive Automobile Completely Able Take Medications Completely Able Use Telephone Completely Able Care for Appearance Completely Able Use Toilet Completely Able Bath / Shower Completely Able Dress Self Completely Able Feed Self Completely Able Walk Completely Able Get In / Out Bed Completely Able Housework Completely Able Prepare Meals Completely Able Handle Money Completely Able Shop for Self Completely Able Electronic Signature(s) Signed: 05/17/2021 3:19:14 PM By: Donnamarie Poag Entered By: Donnamarie Poag on 05/17/2021  09:52:25 [name] Bean ([id number]) -------------------------------------------------------------------------------- Education Screening Details Patient Name: [name] Bean Date of Service: 05/17/2021 9:45 AM Medical Record Number: [id number] Patient Account Number: [id number] Date of Birth/Sex: [birth year] (86 y.o. M) Treating RN: Donnamarie Poag Primary Care Jude Linck: Harrel Lemon Other Clinician: Referring Ethelean Colla: Milagros Evener Treating Nzinga Ferran/Extender: Skipper Cliche in Treatment: 0 Primary Learner Assessed: Patient Learning Preferences/Education Level/Primary Language Learning Preference: Explanation Highest Education Level: High School Preferred Language: English Cognitive Barrier Language Barrier: No Translator Needed: No Memory Deficit: No Emotional Barrier: No Cultural/Religious Beliefs Affecting Medical Care: No Physical Barrier Impaired Vision: No Impaired Hearing: No Decreased Hand dexterity: No Knowledge/Comprehension Knowledge Level: Medium Comprehension Level: High Ability to understand written instructions: High Ability to understand verbal instructions: High Motivation Anxiety Level: Calm Cooperation: Cooperative Education Importance: Acknowledges Need Interest in Health Problems: Asks Questions Perception: Coherent Willingness to Engage in Self-Management High Activities: Readiness to Engage in Self-Management High Activities: Electronic Signature(s) Signed: 05/17/2021 3:19:14 PM By: Donnamarie Poag Entered ByDonnamarie Poag on 05/17/2021 09:57:38 [name] Bean ([id number]) -------------------------------------------------------------------------------- Fall Risk Assessment Details Patient Name: [name] Bean Date of Service: 05/17/2021 9:45 AM Medical Record Number: [id number] Patient Account Number: [id number] Date of Birth/Sex: Feb 28, 1935 (86 y.o. M) Treating RN: Donnamarie Poag Primary Care Eraina Winnie: Harrel Lemon Other Clinician: Referring Jaymz Traywick: Milagros Evener Treating Olympia Adelsberger/Extender: Skipper Cliche in Treatment: 0 Fall Risk Assessment Items Have you had 2 or more falls in the last 12 monthso 0 No Have you had any fall that resulted in injury in the last 12 monthso 0 No FALLS RISK SCREEN History of falling - immediate or within 3 months 0 No Secondary diagnosis (Do you have 2 or more medical diagnoseso) 0 No Ambulatory aid None/bed rest/wheelchair/nurse 0 No Crutches/cane/walker 15 Yes Furniture 0 No Intravenous therapy Access/Saline/Heparin Lock 0 No Gait/Transferring Normal/ bed rest/ wheelchair 0 No Weak (short steps with or without shuffle, stooped but able to lift head while walking, may  10 Yes seek support from furniture) Impaired (short steps with shuffle, may have difficulty arising from chair, head down, impaired 0 No balance) Mental Status Oriented to own ability 0 Yes Electronic Signature(s) Signed: 05/17/2021 3:19:14 PM By: Donnamarie Poag Entered By: Donnamarie Poag on 05/17/2021 09:56:59 [facility], [name] ([id number]) -------------------------------------------------------------------------------- Foot Assessment Details Patient Name: [name] Bean Date of Service: 05/17/2021 9:45 AM Medical Record Number: [id number] Patient Account Number: [id number] Date of Birth/Sex: 25-Jan-1935 (86 y.o. M) Treating RN: Donnamarie Poag Primary Care Ainhoa Rallo: Harrel Lemon Other Clinician: Referring Khaniyah Bezek: Milagros Evener Treating Lonn Im/Extender: Skipper Cliche in Treatment: 0 Foot Assessment Items Site Locations + = Sensation present, - = Sensation absent, C = Callus, U = Ulcer R = Redness, W = Warmth, M = Maceration, PU = Pre-ulcerative lesion F = Fissure, S = Swelling, D = Dryness Assessment Right: Left: Other Deformity: No Yes Prior Foot Ulcer: No No Prior Amputation: No No Charcot Joint: No No Ambulatory Status: Ambulatory Without Help Gait: Steady Notes hammer toe L Electronic Signature(s) Signed: 05/17/2021 3:19:14 PM  By: Donnamarie Poag Entered By: Donnamarie Poag on 05/17/2021 09:56:13 [name] Bean ([id number]) -------------------------------------------------------------------------------- Nutrition Risk Screening Details Patient Name: [name] Bean Date of Service: 05/17/2021 9:45 AM Medical Record Number: [id number] Patient Account Number: [id number] Date of Birth/Sex: [birth year] (86 y.o. M) Treating RN: Donnamarie Poag Primary Care Neo Yepiz: Harrel Lemon Other Clinician: Referring Matheau Orona: Milagros Evener Treating Sherae Santino/Extender: Skipper Cliche in Treatment: 0 Height (in): 73 Weight (lbs): 260 Body Mass Index (BMI): 34.3 Nutrition Risk Screening Items Score Screening NUTRITION RISK SCREEN: I have an illness or condition that made me change the kind and/or amount of food I eat 0 No I eat fewer than two meals per day 3 Yes I eat few fruits and vegetables, or milk products 0 No I have three or more drinks of beer, liquor or wine almost every day 0 No I have tooth or mouth problems that make it hard for me to eat 0 No I don't always have enough money to buy the food I need 0 No I eat alone most of the time 1 Yes I take three or more different prescribed or over-the-counter drugs a day 0 No Without wanting to, I have lost or gained 10 pounds in the last six months 0 No I am not always physically able to shop, cook and/or feed myself 0 No Nutrition Protocols Good Risk Protocol Moderate Risk Protocol 0 Provide education on nutrition High Risk Proctocol Risk Level: Moderate Risk Score: 4 Electronic Signature(s) Signed: 05/17/2021 3:19:14 PM By: Donnamarie Poag Entered By: Donnamarie Poag on 05/17/2021 09:53:03

## 2021-05-18 LAB — MICROSCOPIC EXAMINATION: WBC, UA: >30 /hpf — AB (ref 0–5)

## 2021-05-18 LAB — URINALYSIS, COMPLETE
Bilirubin, UA: NEGATIVE
Glucose, UA: NEGATIVE
Nitrite, UA: NEGATIVE
Protein,UA: NEGATIVE
Specific Gravity, UA: 1.02 (ref 1.005–1.030)
Urobilinogen, Ur: 1 mg/dL (ref 0.2–1.0)
pH, UA: 7 (ref 5.0–7.5)

## 2021-05-18 NOTE — Progress Notes (Signed)
[name] Bean, [name] Bean ([id number]) Visit Report for 05/17/2021 Chief Complaint Document Details Patient Name: [name] Bean Date of Service: 05/17/2021 9:45 AM Medical Record Number: [id number] Patient Account Number: [id number] Date of Birth/Sex: 03-28-1935 (86 y.o. M) Treating RN: Donnamarie Poag Primary Care Provider: Harrel Lemon Other Clinician: Referring Provider: Milagros Evener Treating Provider/Extender: Skipper Cliche in Treatment: 0 Information Obtained from: Patient Chief Complaint Left LE Ulcer Electronic Signature(s) Signed: 05/17/2021 10:32:09 AM By: Worthy Keeler PA-C Entered By: Worthy Keeler on 05/17/2021 10:32:09 [name] Bean ([id number]) -------------------------------------------------------------------------------- Debridement Details Patient Name: [name] Bean Date of Service: 05/17/2021 9:45 AM Medical Record Number: [id number] Patient Account Number: [id number] Date of Birth/Sex: 1935/05/05 (86 y.o. M) Treating RN: Donnamarie Poag Primary Care Provider: Harrel Lemon Other Clinician: Referring Provider: Milagros Evener Treating Provider/Extender: Skipper Cliche in Treatment: 0 Debridement Performed for Wound #1 Left,Distal,Medial Lower Leg Assessment: Performed By: Physician Tommie Sams., PA-C Debridement Type: Debridement Severity of Tissue Pre Debridement: Fat layer exposed Level of Consciousness (Pre- Awake and Alert procedure): Pre-procedure Verification/Time Out Yes - 10:36 Taken: Start Time: 10:36 Pain Control: Lidocaine Total Area Debrided (L x W): 3 (cm) x 6 (cm) = 18 (cm) Tissue and other material Viable, Non-Viable, Slough, Subcutaneous, Biofilm, Slough debrided: Level: Skin/Subcutaneous Tissue Debridement Description: Excisional Instrument: Curette Bleeding: Minimum Hemostasis Achieved: Pressure Response to Treatment: Procedure was tolerated well Level of Consciousness (Post- Awake and Alert procedure): Post Debridement Measurements of Total  Wound Length: (cm) 3.7 Width: (cm) 2.6 Depth: (cm) 0.3 Volume: (cm) 2.267 Character of Wound/Ulcer Post Debridement: Improved Severity of Tissue Post Debridement: Fat layer exposed Post Procedure Diagnosis Same as Pre-procedure Electronic Signature(s) Signed: 05/17/2021 3:19:14 PM By: Donnamarie Poag Signed: 05/17/2021 9:56:09 PM By: Worthy Keeler PA-C Entered By: Donnamarie Poag on 05/17/2021 10:39:04 [name] Bean ([id number]) -------------------------------------------------------------------------------- HPI Details Patient Name: [name] Bean Date of Service: 05/17/2021 9:45 AM Medical Record Number: [id number] Patient Account Number: [id number] Date of Birth/Sex: 1935/06/05 (86 y.o. M) Treating RN: Donnamarie Poag Primary Care Provider: Harrel Lemon Other Clinician: Referring Provider: Milagros Evener Treating Provider/Extender: Skipper Cliche in Treatment: 0 History of Present Illness HPI Description: 05/17/2021 upon evaluation today patient presents for initial evaluation here in the clinic concerning issues that he has been healing. This again is not terribly uncommon especially with the significant swelling that he is experiencing. He does not have the compression wraps or compression stockings he tells me has never been told he should wear those but he definitely should be wearing those. Fortunately there does not appear to be any evidence of active infection at this time which is great news systemically though locally I do see some evidence of maceration and there is some question as to whether or not honestly he may have some infection present. He definitely has some slough and biofilm that needs to be cleared away. Either way I think with appropriate compression we can definitely get things moving in the right direction. Patient does have a history of chronic venous stasis, lymphedema, and his wounds unfortunately are right in the lower extremity on the left where he does have  significant swelling and lymphedema. This means this can make things somewhat more difficult to heal in general. Other than this he also has a history of hypertension. Electronic Signature(s) Signed: 05/17/2021 9:38:10 PM By: Worthy Keeler PA-C Entered By: Worthy Keeler on 05/17/2021 21:38:10 [facility], [place] ([id number]) -------------------------------------------------------------------------------- Physical Exam Details Patient Name: [name] Bean Date of Service: 05/17/2021 9:45 AM Medical Record Number: [id number]  Patient Account Number: [id number] Date of Birth/Sex: 24-May-1935 (86 y.o. M) Treating RN: Donnamarie Poag Primary Care Provider: Harrel Lemon Other Clinician: Referring Provider: Milagros Evener Treating Provider/Extender: Skipper Cliche in Treatment: 0 Constitutional Well-nourished and well-hydrated in no acute distress. Respiratory normal breathing without difficulty. Psychiatric this patient is able to make decisions and demonstrates good insight into disease process. Alert and Oriented x 3. pleasant and cooperative. Notes Upon inspection patient's wound bed actually showed signs of good granulation epithelization at this point. Fortunately there does not appear to be any evidence of active infection which is great news and overall very pleased with where things stand today. No fevers, chills, nausea, vomiting, or diarrhea. I did perform MolecuLight imaging and this did show evidence of blush discoloration around the edges of the wound in the perimeter. Subsequently I did actually perform a targeted debridement as well as central debridement and checking back on the wound postdebridement the overall imaging appear to be significantly better but that there was still evidence of red fluorescence noted noted I think that we can control this likely with topical dressings as well as proper compression wrapping I think this would have a great job. Electronic Signature(s) Signed:  05/17/2021 9:40:03 PM By: Worthy Keeler PA-C Entered By: Worthy Keeler on 05/17/2021 21:40:03 [facility], [place] ([id number]) -------------------------------------------------------------------------------- Physician Orders Details Patient Name: [name] Bean Date of Service: 05/17/2021 9:45 AM Medical Record Number: [id number] Patient Account Number: [id number] Date of Birth/Sex: 09-19-1934 (86 y.o. M) Treating RN: Donnamarie Poag Primary Care Provider: Harrel Lemon Other Clinician: Referring Provider: Milagros Evener Treating Provider/Extender: Skipper Cliche in Treatment: 0 Verbal / Phone Orders: No Diagnosis Coding ICD-10 Coding Code Description I87.2 Venous insufficiency (chronic) (peripheral) I89.0 Lymphedema, not elsewhere classified I10 Essential (primary) hypertension Follow-up Appointments o Return Appointment in 1 week. o Nurse Visit as needed - call if needed Bathing/ Shower/ Hygiene o May shower with wound dressing protected with water repellent cover or cast protector. o No tub bath. Edema Control - Lymphedema / Segmental Compressive Device / Other o Patient to wear own compression stockings. Remove compression stockings every night before going to bed and put on every morning when getting up. - right leg o Elevate legs to the level of the heart and pump ankles as often as possible o Elevate leg(s) parallel to the floor when sitting. o DO YOUR BEST to sleep in the bed at night. DO NOT sleep in your recliner. Long hours of sitting in a recliner leads to swelling of the legs and/or potential wounds on your backside. Wound Treatment Wound #1 - Lower Leg Wound Laterality: Left, Medial, Distal Cleanser: Soap and Water 1 x Per Week/30 Days Discharge Instructions: Gently cleanse wound with antibacterial soap, rinse and pat dry prior to dressing wounds Primary Dressing: Hydrofera Blue Ready Transfer Foam, 2.5x2.5 (in/in) 1 x Per Week/30 Days Discharge Instructions:  Apply Hydrofera Blue Ready to wound bed as directed Secondary Dressing: ABD Pad 5x9 (in/in) 1 x Per Week/30 Days Discharge Instructions: Cover with ABD pad Compression Wrap: Profore Lite LF 3 Multilayer Compression Bandaging System 1 x Per Week/30 Days Discharge Instructions: Apply 3 multi-layer wrap as prescribed. Electronic Signature(s) Signed: 05/17/2021 3:19:14 PM By: Donnamarie Poag Signed: 05/17/2021 9:56:09 PM By: Worthy Keeler PA-C Entered By: Donnamarie Poag on 05/17/2021 10:41:17 [name] Bean ([id number]) -------------------------------------------------------------------------------- Problem List Details Patient Name: [name] Bean Date of Service: 05/17/2021 9:45 AM Medical Record Number: [id number] Patient Account Number: [id number] Date of Birth/Sex: [birth year] (86 y.o. M)  Treating RN: Donnamarie Poag Primary Care Provider: Harrel Lemon Other Clinician: Referring Provider: Milagros Evener Treating Provider/Extender: Skipper Cliche in Treatment: 0 Active Problems ICD-10 Encounter Code Description Active Date MDM Diagnosis I87.2 Venous insufficiency (chronic) (peripheral) 05/17/2021 No Yes I89.0 Lymphedema, not elsewhere classified 05/17/2021 No Yes L97.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 layer 05/17/2021 No Yes exposed [place] (primary) hypertension 05/17/2021 No Yes Inactive Problems Resolved Problems Electronic Signature(s) Signed: 05/17/2021 9:35:06 PM By: Worthy Keeler PA-C Previous Signature: 05/17/2021 10:31:56 AM Version By: Worthy Keeler PA-C Entered By: Worthy Keeler on 05/17/2021 21:35:06 [facility], [place] ([id number]) -------------------------------------------------------------------------------- Progress Note Details Patient Name: [name] Bean Date of Service: 05/17/2021 9:45 AM Medical Record Number: [id number] Patient Account Number: [id number] Date of Birth/Sex: 1934/10/28 (86 y.o. M) Treating RN: Donnamarie Poag Primary Care  Provider: Harrel Lemon Other Clinician: Referring Provider: Milagros Evener Treating Provider/Extender: Skipper Cliche in Treatment: 0 Subjective Chief Complaint Information obtained from Patient Left LE Ulcer History of Present Illness (HPI) 05/17/2021 upon evaluation today patient presents for initial evaluation here in the clinic concerning issues that he has been healing. This again is not terribly uncommon especially with the significant swelling that he is experiencing. He does not have the compression wraps or compression stockings he tells me has never been told he should wear those but he definitely should be wearing those. Fortunately there does not appear to be any evidence of active infection at this time which is great news systemically though locally I do see some evidence of maceration and there is some question as to whether or not honestly he may have some infection present. He definitely has some slough and biofilm that needs to be cleared away. Either way I think with appropriate compression we can definitely get things moving in the right direction. Patient does have a history of chronic venous stasis, lymphedema, and his wounds unfortunately are right in the lower extremity on the left where he does have significant swelling and lymphedema. This means this can make things somewhat more difficult to heal in general. Other than this he also has a history of hypertension. Patient History Information obtained from Patient. Allergies amoxicillin (Severity: Severe) Social History Former smoker - started on 09/04/1981, Marital Status - Divorced, Alcohol Use - Moderate, Drug Use - No History, Caffeine Use - Daily. Medical History Eyes Patient has history of Cataracts - hx Hematologic/Lymphatic Patient has history of Anemia Respiratory Patient has history of Asthma, Sleep Apnea - refuses CPAP Cardiovascular Patient has history of Coronary Artery  Disease Musculoskeletal Patient has history of Osteoarthritis Review of Systems (ROS) Constitutional Symptoms ([place]) Denies complaints or symptoms of Fatigue, Fever, Chills, Marked Weight Change. Eyes Complains or has symptoms of Glasses / Contacts. Ear/Nose/Mouth/Throat Denies complaints or symptoms of Difficult clearing ears, Sinusitis. Hematologic/Lymphatic Denies complaints or symptoms of Bleeding / Clotting Disorders, Human Immunodeficiency Virus. Respiratory Complains or has symptoms of Shortness of Breath. Cardiovascular Complains or has symptoms of LE edema. Gastrointestinal GERD Endocrine Denies complaints or symptoms of Hepatitis, Thyroid disease, Polydypsia (Excessive Thirst). Genitourinary Denies complaints or symptoms of Kidney failure/ Dialysis, Incontinence/dribbling. Immunological Denies complaints or symptoms of Hives, Itching. Integumentary (Skin) Denies complaints or symptoms of Wounds, Bleeding or bruising tendency, Breakdown, Swelling. Musculoskeletal Complains or has symptoms of Muscle Pain, Muscle Weakness. Neurologic [name] Bean, [name] Bean ([id number]) Denies complaints or symptoms of Numbness/parasthesias, Focal/Weakness. Psychiatric Denies complaints or symptoms of Anxiety, Claustrophobia. Objective Constitutional Well-nourished and well-hydrated in no acute  distress. Vitals Time Taken: 9:48 AM, Height: 73 in, Source: Stated, Weight: 260 lbs, Source: Measured, BMI: 34.3, Temperature: 98.3 F, Pulse: 70 bpm, Respiratory Rate: 16 breaths/min, Blood Pressure: 109/67 mmHg. Respiratory normal breathing without difficulty. Psychiatric this patient is able to make decisions and demonstrates good insight into disease process. Alert and Oriented x 3. pleasant and cooperative. General Notes: Upon inspection patient's wound bed actually showed signs of good granulation epithelization at this point. Fortunately there does not appear to be any evidence of  active infection which is great news and overall very pleased with where things stand today. No fevers, chills, nausea, vomiting, or diarrhea. I did perform MolecuLight imaging and this did show evidence of blush discoloration around the edges of the wound in the perimeter. Subsequently I did actually perform a targeted debridement as well as central debridement and checking back on the wound postdebridement the overall imaging appear to be significantly better but that there was still evidence of red fluorescence noted noted I think that we can control this likely with topical dressings as well as proper compression wrapping I think this would have a great job. Integumentary (Hair, Skin) Wound #1 status is Open. Original cause of wound was Bump. The date acquired was: 05/03/2021. The wound is located on the Left,Distal,Medial Lower Leg. The wound measures 3.7cm length x 2.6cm width x 0.3cm depth; 7.556cm^2 area and 2.267cm^3 volume. There is Fat Layer (Subcutaneous Tissue) exposed. There is no tunneling or undermining noted. There is a medium amount of serosanguineous drainage noted. There is medium (34-66%) red, pink granulation within the wound bed. There is a medium (34-66%) amount of necrotic tissue within the wound bed including Adherent Slough. Assessment Active Problems ICD-10 Venous insufficiency (chronic) (peripheral) Lymphedema, not elsewhere classified Non-pressure chronic ulcer of other part of left lower leg with fat layer exposed Essential (primary) hypertension Procedures Wound #1 Pre-procedure diagnosis of Wound #1 is a Venous Leg Ulcer located on the Left,Distal,Medial Lower Leg .Severity of Tissue Pre Debridement is: Fat layer exposed. There was a Excisional Skin/Subcutaneous Tissue Debridement with a total area of 18 sq cm performed by Tommie Sams., PA-C. With the following instrument(s): Curette to remove Viable and Non-Viable tissue/material. Material removed includes  Subcutaneous Tissue, Slough, and Biofilm after achieving pain control using Lidocaine. A time out was conducted at 10:36, prior to the start of the procedure. A Minimum amount of bleeding was controlled with Pressure. The procedure was tolerated well. Post Debridement Measurements: 3.7cm length x 2.6cm width x 0.3cm depth; 2.267cm^3 volume. Character of Wound/Ulcer Post Debridement is improved. Severity of Tissue Post Debridement is: Fat layer exposed. Post procedure Diagnosis Wound #1: Same as Pre-Procedure Pre-procedure diagnosis of Wound #1 is a Trauma, Other located on the Left,Distal,Medial Lower Leg . There was a Three Layer Compression Therapy Procedure by Donnamarie Poag, RN. Post procedure Diagnosis Wound #1: Same as Pre-Procedure [name] Bean, [name] Bean ([id number]) Plan Follow-up Appointments: Return Appointment in 1 week. Nurse Visit as needed - call if needed Bathing/ Shower/ Hygiene: May shower with wound dressing protected with water repellent cover or cast protector. No tub bath. Edema Control - Lymphedema / Segmental Compressive Device / Other: Patient to wear own compression stockings. Remove compression stockings every night before going to bed and put on every morning when getting up. - right leg Elevate legs to the level of the heart and pump ankles as often as possible Elevate leg(s) parallel to the floor when sitting. DO YOUR BEST to sleep in the bed  at night. DO NOT sleep in your recliner. Long hours of sitting in a recliner leads to swelling of the legs and/or potential wounds on your backside. WOUND #1: - Lower Leg Wound Laterality: Left, Medial, Distal Cleanser: Soap and Water 1 x Per Week/30 Days Discharge Instructions: Gently cleanse wound with antibacterial soap, rinse and pat dry prior to dressing wounds Primary Dressing: Hydrofera Blue Ready Transfer Foam, 2.5x2.5 (in/in) 1 x Per Week/30 Days Discharge Instructions: Apply Hydrofera Blue Ready to wound bed as  directed Secondary Dressing: ABD Pad 5x9 (in/in) 1 x Per Week/30 Days Discharge Instructions: Cover with ABD pad Compression Wrap: Profore Lite LF 3 Multilayer Compression Bandaging System 1 x Per Week/30 Days Discharge Instructions: Apply 3 multi-layer wrap as prescribed. 1. Based on what I am seeing I did actually go ahead and recommend that we initiate treatment with the [facility] antimicrobial dressing which I think will do a great job. We will cover this with an ABD pad followed by 3 layer compression wrap to start. 2. I am also can recommend the patient should be elevating his leg is much as possible to try to help with edema control. 3. I am also going to recommend that we continue to monitor for any signs of worsening with regard to infection obviously the MolecuLight imaging did show higher than normal bacterial colonization. With that being said I am hopeful we can control this with topical treatments in general. We will see patient back for reevaluation in 1 week here in the clinic. If anything worsens or changes patient will contact our office for additional recommendations. MolecuLight Medical Necessity Initial Evaluation of the wound with MolecuLightDX to determine Fluorescence bacterial imaging was medically necessary today due to: baseline bacterial bioburden level Wound(s) Scanned The following wound(s) were scanned with MolecuLight DX Left lower extremity MolecuLight Procedure The MolecularLight DX device was cleaned with a disinfectant wipe prior to use., The correct patient profile was confirmed and correct wound was verified., Range finder sensor used to ensure appropriate distance The following was completed: selected between imaging unit and wound bed, Room lights were turned off and the ambient light sensor was checked., Blue circle appeared around the lightbulb., The fluorescence icon was selected. Screen was tapped to enhance focus and the image was  captured. Additional drapes were used to ensure adequate darknesso No MolecuLight Results The results of todayos scan revealed: Red Colors, [place] The indicated colors were noted in the following areas. In the periphery of the wound As a result of todayos scan, the following treatment plans were put in Initiated treatment with targeted debridement as well as antimicrobial place. Hydrofera Blue Potential ICD-10 Codes ICD-10 49.9 Bacterial infection unspecified ([place]) [name] Bean, [name] Bean ([id number]) Electronic Signature(s) Signed: 05/17/2021 9:42:26 PM By: Worthy Keeler PA-C Previous Signature: 05/17/2021 9:41:40 PM Version By: Worthy Keeler PA-C Entered By: Worthy Keeler on 05/17/2021 21:42:25 [name] Bean ([id number]) -------------------------------------------------------------------------------- ROS/PFSH Details Patient Name: [name] Bean Date of Service: 05/17/2021 9:45 AM Medical Record Number: [id number] Patient Account Number: [id number] Date of Birth/Sex: 04-18-1935 (86 y.o. M) Treating RN: Donnamarie Poag Primary Care Provider: Harrel Lemon Other Clinician: Referring Provider: Milagros Evener Treating Provider/Extender: Skipper Cliche in Treatment: 0 Information Obtained From Patient Constitutional Symptoms (General Health) Complaints and Symptoms: Negative for: Fatigue; Fever; Chills; Marked Weight Change Eyes Complaints and Symptoms: Positive for: Glasses / Contacts Medical History: Positive for: Cataracts - hx Ear/Nose/Mouth/Throat Complaints and Symptoms: Negative for: Difficult clearing ears; Sinusitis Hematologic/Lymphatic Complaints and Symptoms:  Negative for: Bleeding / Clotting Disorders; Human Immunodeficiency Virus Medical History: Positive for: Anemia Respiratory Complaints and Symptoms: Positive for: Shortness of Breath Medical History: Positive for: Asthma; Sleep Apnea - refuses CPAP Cardiovascular Complaints and Symptoms: Positive for: LE  edema Medical History: Positive for: Coronary Artery Disease Endocrine Complaints and Symptoms: Negative for: Hepatitis; Thyroid disease; Polydypsia (Excessive Thirst) Genitourinary Complaints and Symptoms: Negative for: Kidney failure/ Dialysis; Incontinence/dribbling Immunological Complaints and Symptoms: Negative for: Hives; Itching [name] Bean, [name] Bean ([id number]) Integumentary (Skin) Complaints and Symptoms: Negative for: Wounds; Bleeding or bruising tendency; Breakdown; Swelling Musculoskeletal Complaints and Symptoms: Positive for: Muscle Pain; Muscle Weakness Medical History: Positive for: Osteoarthritis Neurologic Complaints and Symptoms: Negative for: Numbness/parasthesias; Focal/Weakness Psychiatric Complaints and Symptoms: Negative for: Anxiety; Claustrophobia Gastrointestinal Complaints and Symptoms: Review of System Notes: GERD Oncologic HBO Extended History Items Eyes: Cataracts Immunizations Pneumococcal Vaccine: Received Pneumococcal Vaccination: Yes Received Pneumococcal Vaccination On or After 60th Birthday: Yes Implantable Devices None Family and Social History Former smoker - started on 09/04/1981; Marital Status - Divorced; Alcohol Use: Moderate; Drug Use: No History; Caffeine Use: Daily; Financial Concerns: No; Food, Clothing or Shelter Needs: No; Support System Lacking: No; Transportation Concerns: No Electronic Signature(s) Signed: 05/17/2021 3:19:14 PM By: Donnamarie Poag Signed: 05/17/2021 9:56:09 PM By: Worthy Keeler PA-C Entered By: Donnamarie Poag on 05/17/2021 10:05:07 [name] Bean ([id number]) -------------------------------------------------------------------------------- [place] Details Patient Name: [name] Bean Date of Service: 05/17/2021 Medical Record Number: [id number] Patient Account Number: [id number] Date of Birth/Sex: 1934/12/16 (86 y.o. M) Treating RN: Donnamarie Poag Primary Care Provider: Harrel Lemon Other Clinician: Referring  Provider: Milagros Evener Treating Provider/Extender: Skipper Cliche in Treatment: 0 Diagnosis Coding ICD-10 Codes Code Description I87.2 Venous insufficiency (chronic) (peripheral) I89.0 Lymphedema, not elsewhere classified L97.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 layer exposed [place] (primary) hypertension Facility Procedures CPT4 Code: [id number] Description: 99213 - WOUND CARE VISIT-LEV 3 EST PT Modifier: Quantity: 1 CPT4 Code: [id number] Description: [id number] - DEB SUBQ TISSUE 20 SQ CM/< Modifier: Quantity: 1 CPT4 Code: Description: ICD-10 Diagnosis Description L97.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 layer Modifier: exposed Quantity: CPT4 Code: [id number] Description: NONCNTACT RT FLORO WND 1ST STE Modifier: Quantity: 1 CPT4 Code: Description: ICD-10 Diagnosis Description L97.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 layer Modifier: exposed Quantity: Physician Procedures CPT4 CodeSE:[id number] Description: WC PHYS LEVEL 3 o NEW PT Modifier: 25 Quantity: 1 CPT4 Code: Description: ICD-10 Diagnosis Description I87.2 Venous insufficiency (chronic) (peripheral) I89.0 Lymphedema, not elsewhere classified L97.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 layer I10 Essential (primary) hypertension Modifier: exposed Quantity: CPT4 CodeLU:[id number] Description: [id number] - WC PHYS SUBQ TISS 20 SQ CM Modifier: Quantity: 1 CPT4 Code: Description: ICD-10 Diagnosis Description L97.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 layer Modifier: exposed Quantity: CPT4 Code: [id number] Description: HC MOLECULIGHT WND IMG 1ST ANATOMIC SITE Modifier: Quantity: 1 CPT4 Code: Description: ICD-10 Diagnosis Description L97.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 layer Modifier: exposed Quantity: Electronic  Signature(s) Signed: 05/17/2021 9:42:06 PM By: Worthy Keeler PA-C Entered By: Worthy Keeler on 05/17/2021 21:42:06

## 2021-05-20 LAB — CULTURE, URINE COMPREHENSIVE

## 2021-05-24 ENCOUNTER — Telehealth: Payer: Self-pay | Admitting: *Deleted

## 2021-05-24 ENCOUNTER — Other Ambulatory Visit: Payer: Self-pay

## 2021-05-24 ENCOUNTER — Encounter: Payer: Medicare Other | Admitting: Physician Assistant

## 2021-05-24 DIAGNOSIS — I872 Venous insufficiency (chronic) (peripheral): Secondary | ICD-10-CM | POA: Diagnosis not present

## 2021-05-24 NOTE — Telephone Encounter (Signed)
Patient called in today and states he is urinating to much , he would like to know if there is something he can take. I asked patient how may times a day and night he goes? He hang up the phone on me after he said never mind.

## 2021-05-24 NOTE — Progress Notes (Addendum)
NELDON, SHEPARD ([id number]) Visit Report for 05/24/2021 Chief Complaint Document Details Patient Name: [name] Bean, [name] Bean Date of Service: 05/24/2021 10:45 AM Medical Record Number: [id number] Patient Account Number: [id number] Date of Birth/Sex: Oct 23, 1934 (86 y.o. M) Treating RN: Dolan Amen Primary Care Provider: Harrel Lemon Other Clinician: Referring Provider: Harrel Lemon Treating Provider/Extender: Skipper Cliche in Treatment: 1 Information Obtained from: Patient Chief Complaint Left LE Ulcer Electronic Signature(s) Signed: 05/24/2021 11:45:03 AM By: Worthy Keeler PA-C Entered By: Worthy Keeler on 05/24/2021 11:45:02 [facility], [place] ([id number]) -------------------------------------------------------------------------------- Debridement Details Patient Name: [name] Bean Date of Service: 05/24/2021 10:45 AM Medical Record Number: [id number] Patient Account Number: [id number] Date of Birth/Sex: [birth year] (86 y.o. M) Treating RN: Dolan Amen Primary Care Provider: Harrel Lemon Other Clinician: Referring Provider: Harrel Lemon Treating Provider/Extender: Skipper Cliche in Treatment: 1 Debridement Performed for Wound #1 Left,Distal,Medial Lower Leg Assessment: Performed By: Physician Tommie Sams., PA-C Debridement Type: Debridement Level of Consciousness (Pre- Awake and Alert procedure): Pre-procedure Verification/Time Out Yes - 11:57 Taken: Start Time: 11:57 Total Area Debrided (L x W): 3.8 (cm) x 2.7 (cm) = 10.26 (cm) Tissue and other material Viable, Non-Viable, Slough, Subcutaneous, Slough debrided: Level: Skin/Subcutaneous Tissue Debridement Description: Excisional Instrument: Curette Bleeding: Minimum Hemostasis Achieved: Pressure Response to Treatment: Procedure was tolerated well Level of Consciousness (Post- Awake and Alert procedure): Post Debridement Measurements of Total Wound Length: (cm) 3.6 Width: (cm) 2.7 Depth: (cm) 0.3 Volume:  (cm) 2.29 Character of Wound/Ulcer Post Debridement: Stable Post Procedure Diagnosis Same as Pre-procedure Electronic Signature(s) Signed: 05/24/2021 4:47:24 PM By: Dolan Amen RN Signed: 05/24/2021 6:22:10 PM By: Worthy Keeler PA-C Entered By: Dolan Amen on 05/24/2021 11:59:16 [name] Bean ([id number]) -------------------------------------------------------------------------------- HPI Details Patient Name: [name] Bean Date of Service: 05/24/2021 10:45 AM Medical Record Number: [id number] Patient Account Number: [id number] Date of Birth/Sex: 07/25/1935 (86 y.o. M) Treating RN: Dolan Amen Primary Care Provider: Harrel Lemon Other Clinician: Referring Provider: Harrel Lemon Treating Provider/Extender: Skipper Cliche in Treatment: 1 History of Present Illness HPI Description: 05/17/2021 upon evaluation today patient presents for initial evaluation here in the clinic concerning issues that he has been healing. This again is not terribly uncommon especially with the significant swelling that he is experiencing. He does not have the compression wraps or compression stockings he tells me has never been told he should wear those but he definitely should be wearing those. Fortunately there does not appear to be any evidence of active infection at this time which is great news systemically though locally I do see some evidence of maceration and there is some question as to whether or not honestly he may have some infection present. He definitely has some slough and biofilm that needs to be cleared away. Either way I think with appropriate compression we can definitely get things moving in the right direction. Patient does have a history of chronic venous stasis, lymphedema, and his wounds unfortunately are right in the lower extremity on the left where he does have significant swelling and lymphedema. This means this can make things somewhat more difficult to heal in general.  Other than this he also has a history of hypertension. 05/24/2021 upon evaluation today patient appears to be doing decently well in regard to his wound. He has been tolerating the dressing changes without complication. Fortunately there does not appear to be any evidence of active infection at this point which is great news and overall I am pleased in that regard. Nonetheless I do believe that  the patient is still having quite a bit of discomfort at least when I cleaned the wound but overall I think we are headed in the appropriate direction. No fevers, chills, nausea, vomiting, or diarrhea. Electronic Signature(s) Signed: 05/24/2021 6:00:03 PM By: Worthy Keeler PA-C Entered By: Worthy Keeler on 05/24/2021 18:00:03 [facility], [place] ([id number]) -------------------------------------------------------------------------------- Physical Exam Details Patient Name: [name] Bean Date of Service: 05/24/2021 10:45 AM Medical Record Number: [id number] Patient Account Number: [id number] Date of Birth/Sex: 04-18-1935 (86 y.o. M) Treating RN: Dolan Amen Primary Care Provider: Harrel Lemon Other Clinician: Referring Provider: Harrel Lemon Treating Provider/Extender: Jeri Cos Weeks in Treatment: 1 Constitutional Well-nourished and well-hydrated in no acute distress. Respiratory normal breathing without difficulty. Psychiatric this patient is able to make decisions and demonstrates good insight into disease process. Alert and Oriented x 3. pleasant and cooperative. Notes Upon inspection patient's wound bed actually showed signs of good granulation and epithelization at this point. Overall I feel like that he is making excellent progress and I am extremely pleased in this regard. No fevers, chills, nausea, vomiting, or diarrhea. I did perform sharp debridement to clear away some of the necrotic debris he tolerated that today without complication. We also did perform fluorescence imaging. This  revealed that the patient did have some blush fluorescence around the edges of the wound this was a bit improved postdebridement but still very similar. I think that the [facility] antimicrobial dressing is appropriate to continue. Electronic Signature(s) Signed: 05/24/2021 6:04:24 PM By: Worthy Keeler PA-C Entered By: Worthy Keeler on 05/24/2021 18:04:24 [facility], [place] ([id number]) -------------------------------------------------------------------------------- Physician Orders Details Patient Name: [name] Bean Date of Service: 05/24/2021 10:45 AM Medical Record Number: [id number] Patient Account Number: [id number] Date of Birth/Sex: 12-May-1935 (86 y.o. M) Treating RN: Dolan Amen Primary Care Provider: Harrel Lemon Other Clinician: Referring Provider: Harrel Lemon Treating Provider/Extender: Skipper Cliche in Treatment: 1 Verbal / Phone Orders: No Diagnosis Coding ICD-10 Coding Code Description I87.2 Venous insufficiency (chronic) (peripheral) I89.0 Lymphedema, not elsewhere classified L97.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 layer exposed [place] (primary) hypertension Follow-up Appointments o Return Appointment in 2 weeks. o Nurse Visit as needed - Next week Bathing/ Shower/ Hygiene o May shower with wound dressing protected with water repellent cover or cast protector. o No tub bath. Edema Control - Lymphedema / Segmental Compressive Device / Other o Patient to wear own compression stockings. Remove compression stockings every night before going to bed and put on every morning when getting up. - right leg o Elevate legs to the level of the heart and pump ankles as often as possible o Elevate leg(s) parallel to the floor when sitting. o DO YOUR BEST to sleep in the bed at night. DO NOT sleep in your recliner. Long hours of sitting in a recliner leads to swelling of the legs and/or potential wounds on your  backside. Wound Treatment Wound #1 - Lower Leg Wound Laterality: Left, Medial, Distal Cleanser: Soap and Water 1 x Per Week/30 Days Discharge Instructions: Gently cleanse wound with antibacterial soap, rinse and pat dry prior to dressing wounds Primary Dressing: Hydrofera Blue Ready Transfer Foam, 2.5x2.5 (in/in) 1 x Per Week/30 Days Discharge Instructions: Apply Hydrofera Blue Ready to wound bed as directed Secondary Dressing: ABD Pad 5x9 (in/in) 1 x Per Week/30 Days Discharge Instructions: Cover with ABD pad Compression Wrap: Profore Lite LF 3 Multilayer Compression Bandaging System 1 x Per Week/30 Days Discharge Instructions: Apply 3 multi-layer  wrap as prescribed. Electronic Signature(s) Signed: 05/24/2021 4:47:24 PM By: Dolan Amen RN Signed: 05/24/2021 6:22:10 PM By: Worthy Keeler PA-C Entered By: Dolan Amen on 05/24/2021 12:14:28 [name] Bean ([id number]) -------------------------------------------------------------------------------- Problem List Details Patient Name: [name] Bean Date of Service: 05/24/2021 10:45 AM Medical Record Number: [id number] Patient Account Number: [id number] Date of Birth/Sex: September 12, 1934 (86 y.o. M) Treating RN: Dolan Amen Primary Care Provider: Harrel Lemon Other Clinician: Referring Provider: Harrel Lemon Treating Provider/Extender: Skipper Cliche in Treatment: 1 Active Problems ICD-10 Encounter Code Description Active Date MDM Diagnosis I87.2 Venous insufficiency (chronic) (peripheral) 05/17/2021 No Yes I89.0 Lymphedema, not elsewhere classified 05/17/2021 No Yes L97.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 layer 05/17/2021 No Yes exposed [place] (primary) hypertension 05/17/2021 No Yes Inactive Problems Resolved Problems Electronic Signature(s) Signed: 05/24/2021 11:44:57 AM By: Worthy Keeler PA-C Entered By: Worthy Keeler on 05/24/2021 11:44:56 [facility], [place]  ([id number]) -------------------------------------------------------------------------------- Progress Note Details Patient Name: [name] Bean Date of Service: 05/24/2021 10:45 AM Medical Record Number: [id number] Patient Account Number: [id number] Date of Birth/Sex: Jan 04, 1935 (86 y.o. M) Treating RN: Dolan Amen Primary Care Provider: Harrel Lemon Other Clinician: Referring Provider: Harrel Lemon Treating Provider/Extender: Skipper Cliche in Treatment: 1 Subjective Chief Complaint Information obtained from Patient Left LE Ulcer History of Present Illness (HPI) 05/17/2021 upon evaluation today patient presents for initial evaluation here in the clinic concerning issues that he has been healing. This again is not terribly uncommon especially with the significant swelling that he is experiencing. He does not have the compression wraps or compression stockings he tells me has never been told he should wear those but he definitely should be wearing those. Fortunately there does not appear to be any evidence of active infection at this time which is great news systemically though locally I do see some evidence of maceration and there is some question as to whether or not honestly he may have some infection present. He definitely has some slough and biofilm that needs to be cleared away. Either way I think with appropriate compression we can definitely get things moving in the right direction. Patient does have a history of chronic venous stasis, lymphedema, and his wounds unfortunately are right in the lower extremity on the left where he does have significant swelling and lymphedema. This means this can make things somewhat more difficult to heal in general. Other than this he also has a history of hypertension. 05/24/2021 upon evaluation today patient appears to be doing decently well in regard to his wound. He has been tolerating the dressing changes without complication. Fortunately  there does not appear to be any evidence of active infection at this point which is great news and overall I am pleased in that regard. Nonetheless I do believe that the patient is still having quite a bit of discomfort at least when I cleaned the wound but overall I think we are headed in the appropriate direction. No fevers, chills, nausea, vomiting, or diarrhea. Objective Constitutional Well-nourished and well-hydrated in no acute distress. Vitals Time Taken: 11:28 AM, Height: 73 in, Weight: 260 lbs, BMI: 34.3, Temperature: 98.6 F, Pulse: 73 bpm, Respiratory Rate: 18 breaths/min, Blood Pressure: 142/82 mmHg. Respiratory normal breathing without difficulty. Psychiatric this patient is able to make decisions and demonstrates good insight into disease process. Alert and Oriented x 3. pleasant and cooperative. General Notes: Upon inspection patient's wound bed actually showed signs of good granulation and epithelization at this point. Overall  I feel like that he is making excellent progress and I am extremely pleased in this regard. No fevers, chills, nausea, vomiting, or diarrhea. I did perform sharp debridement to clear away some of the necrotic debris he tolerated that today without complication. We also did perform fluorescence imaging. This revealed that the patient did have some blush fluorescence around the edges of the wound this was a bit improved postdebridement but still very similar. I think that the [facility] antimicrobial dressing is appropriate to continue. Integumentary (Hair, Skin) Wound #1 status is Open. Original cause of wound was Bump. The date acquired was: 05/03/2021. The wound has been in treatment 1 weeks. The wound is located on the Left,Distal,Medial Lower Leg. The wound measures 3.6cm length x 2.7cm width x 0.2cm depth; 7.634cm^2 area and 1.527cm^3 volume. There is Fat Layer (Subcutaneous Tissue) exposed. There is no tunneling or undermining noted. There is a  medium amount of serosanguineous drainage noted. There is medium (34-66%) red granulation within the wound bed. There is a medium (34-66%) amount of necrotic tissue within the wound bed including Adherent Slough. [facility], [name] ([id number]) Assessment Active Problems ICD-10 Venous insufficiency (chronic) (peripheral) Lymphedema, not elsewhere classified Non-pressure chronic ulcer of other part of left lower leg with fat layer exposed Essential (primary) hypertension Procedures Wound #1 Pre-procedure diagnosis of Wound #1 is a Trauma, Other located on the Left,Distal,Medial Lower Leg . There was a Excisional Skin/Subcutaneous Tissue Debridement with a total area of 10.26 sq cm performed by Tommie Sams., PA-C. With the following instrument(s): Curette to remove Viable and Non-Viable tissue/material. Material removed includes Subcutaneous Tissue and Slough and. A time out was conducted at 11:57, prior to the start of the procedure. A Minimum amount of bleeding was controlled with Pressure. The procedure was tolerated well. Post Debridement Measurements: 3.6cm length x 2.7cm width x 0.3cm depth; 2.29cm^3 volume. Character of Wound/Ulcer Post Debridement is stable. Post procedure Diagnosis Wound #1: Same as Pre-Procedure Pre-procedure diagnosis of Wound #1 is a Trauma, Other located on the Left,Distal,Medial Lower Leg . There was a Three Layer Compression Therapy Procedure with a pre-treatment ABI of 1 by Dolan Amen, RN. Post procedure Diagnosis Wound #1: Same as Pre-Procedure Plan Follow-up Appointments: Return Appointment in 2 weeks. Nurse Visit as needed - Next week Bathing/ Shower/ Hygiene: May shower with wound dressing protected with water repellent cover or cast protector. No tub bath. Edema Control - Lymphedema / Segmental Compressive Device / Other: Patient to wear own compression stockings. Remove compression stockings every night before going to bed and put on every morning  when getting up. - right leg Elevate legs to the level of the heart and pump ankles as often as possible Elevate leg(s) parallel to the floor when sitting. DO YOUR BEST to sleep in the bed at night. DO NOT sleep in your recliner. Long hours of sitting in a recliner leads to swelling of the legs and/or potential wounds on your backside. WOUND #1: - Lower Leg Wound Laterality: Left, Medial, Distal Cleanser: Soap and Water 1 x Per Week/30 Days Discharge Instructions: Gently cleanse wound with antibacterial soap, rinse and pat dry prior to dressing wounds Primary Dressing: Hydrofera Blue Ready Transfer Foam, 2.5x2.5 (in/in) 1 x Per Week/30 Days Discharge Instructions: Apply Hydrofera Blue Ready to wound bed as directed Secondary Dressing: ABD Pad 5x9 (in/in) 1 x Per Week/30 Days Discharge Instructions: Cover with ABD pad Compression Wrap: Profore Lite LF 3 Multilayer Compression Bandaging System 1 x Per Week/30 Days Discharge  Instructions: Apply 3 multi-layer wrap as prescribed. 1. Would recommend currently that we going continue with the [facility] dressing which I think is doing a good job. 2. I am also going to recommend that we have the patient continue with the 3 layer compression wrap which I think is also doing very well for him. 3. I am also going to suggest that the patient should try to elevate his leg is much as possible to help with edema control I think that still appropriate. We will see patient back for reevaluation in 1 week here in the clinic. If anything worsens or changes patient will contact our office for additional recommendations. MolecuLight DX: 1st Scanned Wound [name] Bean, [name] Bean ([id number]) The following wound was scanned with MolecuLight DX): Left lower extremity ulcer Fluorescence bacterial imaging was medically necessary today due to Prior visit fluorescence image demonstrated a high bioburden load (Indication): monitoring impact of prescribe tx MolecuLight Results  Red Colors, Blush Colors, Green Colors The indicated colors were noted in the following area(s). In the periphery of the wound As a result of todayos scan, the following treatment plans were put in Continue [facility] And targeted debridement performed. place. MolecuLight Procedure The MolecularLight DX device was cleaned with a disinfectant wipe prior to use., The correct patient profile was confirmed and correct wound was verified., Range finder sensor used to ensure appropriate distance The following was completed: selected between imaging unit and wound bed, Room lights were turned off and the ambient light sensor was checked., Blue circle appeared around the lightbulb., The fluorescence icon was selected. Screen was tapped to enhance focus and the image was captured. Additional drapes were used to ensure adequate darknesso No Potential ICD-10 Codes ICD-10 A49.9 Bacterial infection unspecified (Red/Blush/Yellow Color) Additional Scanned Wounds Did you scan any additional Woundso No Electronic Signature(s) Signed: 05/24/2021 6:06:01 PM By: Worthy Keeler PA-C Entered By: Worthy Keeler on 05/24/2021 18:06:01 [facility], [place] ([id number]) -------------------------------------------------------------------------------- SuperBill Details Patient Name: [name] Bean Date of Service: 05/24/2021 Medical Record Number: [id number] Patient Account Number: [id number] Date of Birth/Sex: Jan 30, 1935 (86 y.o. M) Treating RN: Dolan Amen Primary Care Provider: Harrel Lemon Other Clinician: Referring Provider: Harrel Lemon Treating Provider/Extender: Skipper Cliche in Treatment: 1 Diagnosis Coding ICD-10 Codes Code Description I87.2 Venous insufficiency (chronic) (peripheral) I89.0 Lymphedema, not elsewhere classified L97.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 layer exposed [place] (primary) hypertension Facility Procedures CPT4 Code:  [id number] Description: 16010 - DEB SUBQ TISSUE 20 SQ CM/< Modifier: Quantity: 1 CPT4 Code: Description: ICD-10 Diagnosis Description L97.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 layer e Modifier: xposed Quantity: CPT4 Code: [id number] Description: NONCNTACT RT FLORO WND 1ST STE (CDM [id number]) Modifier: Quantity: 1 CPT4 Code: Description: ICD-10 Diagnosis Description L97.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 layer e Modifier: xposed Quantity: Physician Procedures CPT4 Code: [id number] Description: 83151 - WC PHYS SUBQ TISS 20 SQ CM Modifier: Quantity: 1 CPT4 Code: Description: ICD-10 Diagnosis Description L97.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 layer Modifier: exposed Quantity: CPT4 Code: 7616W Description: HC MOLECULIGHT WND IMG 1ST ANATOMIC SITE Modifier: Quantity: 1 CPT4 Code: Description: ICD-10 Diagnosis Description L97.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 layer Modifier: exposed Quantity: Electronic Signature(s) Signed: 05/24/2021 6:06:28 PM By: Worthy Keeler PA-C Previous Signature: 05/24/2021 4:47:24 PM Version By: Dolan Amen RN Entered By:  Worthy Keeler on 05/24/2021 28:00:34

## 2021-05-24 NOTE — Progress Notes (Signed)
REYNOL, ARNONE ([id number]) Visit Report for 05/24/2021 Arrival Information Details Patient Name: [name] Bean, [name] Bean Date of Service: 05/24/2021 10:45 AM Medical Record Number: [id number] Patient Account Number: [id number] Date of Birth/Sex: 12-30-1934 (86 y.o. M) Treating RN: Dolan Amen Primary Care Minela Bridgewater: Harrel Lemon Other Clinician: Referring Alassane Kalafut: Harrel Lemon Treating Jerzee Jerome/Extender: Skipper Cliche in Treatment: 1 Visit Information History Since Last Visit Has Dressing in Place as Prescribed: Yes Patient Arrived: Cane Pain Present Now: No Arrival Time: 11:27 Accompanied By: self Transfer Assistance: None Patient Identification Verified: Yes Secondary Verification Process Completed: Yes Patient Has Alerts: Yes Patient Alerts: Patient on Blood Thinner Aspirin 81mg  Electronic Signature(s) Signed: 05/24/2021 4:47:24 PM By: Dolan Amen RN Entered By: Dolan Amen on 05/24/2021 11:28:21 [name] Bean ([id number]) -------------------------------------------------------------------------------- Clinic Level of Care Assessment Details Patient Name: [name] Bean Date of Service: 05/24/2021 10:45 AM Medical Record Number: [id number] Patient Account Number: [id number] Date of Birth/Sex: 07-17-1935 (86 y.o. M) Treating RN: Dolan Amen Primary Care Margues Filippini: Harrel Lemon Other Clinician: Referring Ramari Bray: Harrel Lemon Treating Gustavia Carie/Extender: Skipper Cliche in Treatment: 1 Clinic Level of Care Assessment Items TOOL 1 Quantity Score []  - Use when EandM and Procedure is performed on INITIAL visit 0 ASSESSMENTS - Nursing Assessment / Reassessment []  - General Physical Exam (combine w/ comprehensive assessment (listed just below) when performed on new 0 pt. evals) []  - 0 Comprehensive Assessment (HX, ROS, Risk Assessments, Wounds Hx, etc.) ASSESSMENTS - Wound and Skin Assessment / Reassessment []  - Dermatologic / Skin Assessment (not related to wound area)  0 ASSESSMENTS - Ostomy and/or Continence Assessment and Care []  - Incontinence Assessment and Management 0 []  - 0 Ostomy Care Assessment and Management (repouching, etc.) PROCESS - Coordination of Care []  - Simple Patient / Family Education for ongoing care 0 []  - 0 Complex (extensive) Patient / Family Education for ongoing care []  - 0 Staff obtains Programmer, systems, Records, Test Results / Process Orders []  - 0 Staff telephones HHA, Nursing Homes / Clarify orders / etc []  - 0 Routine Transfer to another Facility (non-emergent condition) []  - 0 Routine Hospital Admission (non-emergent condition) []  - 0 New Admissions / Biomedical engineer / Ordering NPWT, Apligraf, etc. []  - 0 Emergency Hospital Admission (emergent condition) PROCESS - Special Needs []  - Pediatric / Minor Patient Management 0 []  - 0 Isolation Patient Management []  - 0 Hearing / Language / Visual special needs []  - 0 Assessment of Community assistance (transportation, D/C planning, etc.) []  - 0 Additional assistance / Altered mentation []  - 0 Support Surface(s) Assessment (bed, cushion, seat, etc.) INTERVENTIONS - Miscellaneous []  - External ear exam 0 []  - 0 Patient Transfer (multiple staff / Civil Service fast streamer / Similar devices) []  - 0 Simple Staple / Suture removal (25 or less) []  - 0 Complex Staple / Suture removal (26 or more) []  - 0 Hypo/Hyperglycemic Management (do not check if billed separately) []  - 0 Ankle / Brachial Index (ABI) - do not check if billed separately Has the patient been seen at the hospital within the last three years: Yes Total Score: 0 Level Of Care: ____ [name] Bean ([id number]) Electronic Signature(s) Signed: 05/24/2021 4:47:24 PM By: Dolan Amen RN Entered By: Dolan Amen on 05/24/2021 12:14:33 [facility], [name] ([id number]) -------------------------------------------------------------------------------- Compression Therapy Details Patient Name: [name] Bean Date of Service:  05/24/2021 10:45 AM Medical Record Number: [id number] Patient Account Number: [id number] Date of Birth/Sex: 04/01/1935 (86 y.o. M) Treating RN: Dolan Amen Primary Care Natosha Bou: Harrel Lemon Other Clinician: Referring Arcenio Mullaly: Edwina Barth,  John Treating Shauntae Reitman/Extender: Jeri Cos Weeks in Treatment: 1 Compression Therapy Performed for Wound Assessment: Wound #1 Left,Distal,Medial Lower Leg Performed By: Clinician Dolan Amen, RN Compression Type: Three Layer Pre Treatment ABI: 1 Post Procedure Diagnosis Same as Pre-procedure Electronic Signature(s) Signed: 05/24/2021 4:47:24 PM By: Dolan Amen RN Entered By: Dolan Amen on 05/24/2021 11:59:31 [facility], [place] ([id number]) -------------------------------------------------------------------------------- Encounter Discharge Information Details Patient Name: [name] Bean Date of Service: 05/24/2021 10:45 AM Medical Record Number: [id number] Patient Account Number: [id number] Date of Birth/Sex: 01/11/1935 (86 y.o. M) Treating RN: Dolan Amen Primary Care Bryony Kaman: Harrel Lemon Other Clinician: Referring Fredi Geiler: Harrel Lemon Treating Philip Kotlyar/Extender: Skipper Cliche in Treatment: 1 Encounter Discharge Information Items Post Procedure Vitals Discharge Condition: Stable Temperature (F): 98.6 Ambulatory Status: Cane Pulse (bpm): 73 Discharge Destination: Home Respiratory Rate (breaths/min): 18 Transportation: Private Auto Blood Pressure (mmHg): 142/82 Accompanied By: self Schedule Follow-up Appointment: Yes Clinical Summary of Care: Electronic Signature(s) Signed: 05/24/2021 4:47:24 PM By: Dolan Amen RN Entered By: Dolan Amen on 05/24/2021 12:15:44 [facility], [name] ([id number]) -------------------------------------------------------------------------------- Lower Extremity Assessment Details Patient Name: [name] Bean Date of Service: 05/24/2021 10:45 AM Medical Record Number: [id number] Patient Account  Number: [id number] Date of Birth/Sex: 1935/03/10 (86 y.o. M) Treating RN: Dolan Amen Primary Care Lexington Devine: Harrel Lemon Other Clinician: Referring Alixandrea Milleson: Harrel Lemon Treating Nikea Settle/Extender: Jeri Cos Weeks in Treatment: 1 Edema Assessment Assessed: [Left: Yes] [Right: No] Edema: [Left: Ye] [Right: s] Calf Left: Right: Point of Measurement: 40 cm From Medial Instep 47.5 cm Ankle Left: Right: Point of Measurement: 14 cm From Medial Instep 30 cm Knee To Floor Left: Right: From Medial Instep 45 cm Electronic Signature(s) Signed: 05/24/2021 4:47:24 PM By: Dolan Amen RN Entered By: Dolan Amen on 05/24/2021 12:13:54 [facility], [name] ([id number]) -------------------------------------------------------------------------------- Multi Wound Chart Details Patient Name: [name] Bean Date of Service: 05/24/2021 10:45 AM Medical Record Number: [id number] Patient Account Number: [id number] Date of Birth/Sex: [birth year] (86 y.o. M) Treating RN: Dolan Amen Primary Care Monike Bragdon: Harrel Lemon Other Clinician: Referring Jenny Omdahl: Harrel Lemon Treating Artha Chiasson/Extender: Skipper Cliche in Treatment: 1 Vital Signs Height(in): 73 Pulse(bpm): 80 Weight(lbs): 260 Blood Pressure(mmHg): 142/82 Body Mass Index(BMI): 34 Temperature(F): 98.6 Respiratory Rate(breaths/min): 18 Photos: [N/A:N/A] Wound Location: Left, Distal, Medial Lower Leg N/A N/A Wounding Event: Bump N/A N/A Primary Etiology: Trauma, Other N/A N/A Secondary Etiology: Lymphedema N/A N/A Comorbid History: Cataracts, Anemia, Asthma, Sleep N/A N/A Apnea, Coronary Artery Disease, Osteoarthritis Date Acquired: 05/03/2021 N/A N/A Weeks of Treatment: 1 N/A N/A Wound Status: Open N/A N/A Clustered Wound: Yes N/A N/A Clustered Quantity: 2 N/A N/A Measurements L x W x D (cm) 3.6x2.7x0.2 N/A N/A Area (cm) : 7.634 N/A N/A Volume (cm) : 1.527 N/A N/A % Reduction in Area: -1.00% N/A N/A % Reduction in  Volume: 32.60% N/A N/A Classification: Full Thickness Without Exposed N/A N/A Support Structures Exudate Amount: Medium N/A N/A Exudate Type: Serosanguineous N/A N/A Exudate Color: red, brown N/A N/A Granulation Amount: Medium (34-66%) N/A N/A Granulation Quality: Red N/A N/A Necrotic Amount: Medium (34-66%) N/A N/A Exposed Structures: Fat Layer (Subcutaneous Tissue): N/A N/A Yes Fascia: No Tendon: No Muscle: No Joint: No Bone: No [name] Bean, [name] Bean ([id number]) Epithelialization: Large (67-100%) N/A N/A Treatment Notes Electronic Signature(s) Signed: 05/24/2021 4:47:24 PM By: Dolan Amen RN Entered By: Dolan Amen on 05/24/2021 11:57:15 [name] Bean ([id number]) -------------------------------------------------------------------------------- Multi-Disciplinary Care Plan Details Patient Name: [name] Bean Date of Service: 05/24/2021 10:45 AM Medical Record Number: [id number] Patient Account Number: [id number] Date of Birth/Sex:  Aug 15, 1935 (86 y.o. M) Treating RN: Dolan Amen Primary Care Kionte Baumgardner: Harrel Lemon Other Clinician: Referring Menucha Dicesare: Harrel Lemon Treating Adelaido Nicklaus/Extender: Skipper Cliche in Treatment: 1 Active Inactive Wound/Skin Impairment Nursing Diagnoses: Impaired tissue integrity Knowledge deficit related to smoking impact on wound healing Knowledge deficit related to ulceration/compromised skin integrity Goals: Ulcer/skin breakdown will have a volume reduction of 30% by week 4 Date Initiated: 05/17/2021 Target Resolution Date: 06/16/2021 Goal Status: Active Ulcer/skin breakdown will have a volume reduction of 50% by week 8 Date Initiated: 05/17/2021 Target Resolution Date: 07/17/2021 Goal Status: Active Ulcer/skin breakdown will have a volume reduction of 80% by week 12 Date Initiated: 05/17/2021 Target Resolution Date: 08/16/2021 Goal Status: Active Ulcer/skin breakdown will heal within 14 weeks Date Initiated: 05/17/2021 Target  Resolution Date: 08/30/2021 Goal Status: Active Interventions: Assess patient/caregiver ability to obtain necessary supplies Assess patient/caregiver ability to perform ulcer/skin care regimen upon admission and as needed Assess ulceration(s) every visit Notes: Electronic Signature(s) Signed: 05/24/2021 4:47:24 PM By: Dolan Amen RN Entered By: Dolan Amen on 05/24/2021 11:57:07 [name] Bean ([id number]) -------------------------------------------------------------------------------- Pain Assessment Details Patient Name: [name] Bean Date of Service: 05/24/2021 10:45 AM Medical Record Number: [id number] Patient Account Number: [id number] Date of Birth/Sex: September 09, 1934 (86 y.o. M) Treating RN: Dolan Amen Primary Care Taler Kushner: Harrel Lemon Other Clinician: Referring Andriy Sherk: Harrel Lemon Treating Auryn Paige/Extender: Skipper Cliche in Treatment: 1 Active Problems Location of Pain Severity and Description of Pain Patient Has Paino No Site Locations Rate the pain. Current Pain Level: 0 Pain Management and Medication Current Pain Management: Electronic Signature(s) Signed: 05/24/2021 4:47:24 PM By: Dolan Amen RN Entered By: Dolan Amen on 05/24/2021 11:28:48 [name] Bean ([id number]) -------------------------------------------------------------------------------- Patient/Caregiver Education Details Patient Name: [name] Bean Date of Service: 05/24/2021 10:45 AM Medical Record Number: [id number] Patient Account Number: [id number] Date of Birth/Gender: 1934-10-01 (86 y.o. M) Treating RN: Dolan Amen Primary Care Physician: Harrel Lemon Other Clinician: Referring Physician: Harrel Lemon Treating Physician/Extender: Skipper Cliche in Treatment: 1 Education Assessment Education Provided To: Patient Education Topics Provided Wound/Skin Impairment: Methods: Explain/Verbal Responses: State content correctly Electronic Signature(s) Signed: 05/24/2021  4:47:24 PM By: Dolan Amen RN Entered By: Dolan Amen on 05/24/2021 12:14:48 [facility], [name] ([id number]) -------------------------------------------------------------------------------- Wound Assessment Details Patient Name: [name] Bean Date of Service: 05/24/2021 10:45 AM Medical Record Number: [id number] Patient Account Number: [id number] Date of Birth/Sex: 18-Oct-1934 (86 y.o. M) Treating RN: Dolan Amen Primary Care Bernabe Dorce: Harrel Lemon Other Clinician: Referring Patty Leitzke: Harrel Lemon Treating Kalicia Dufresne/Extender: Jeri Cos Weeks in Treatment: 1 Wound Status Wound Number: 1 Primary Trauma, Other Etiology: Wound Location: Left, Distal, Medial Lower Leg Secondary Lymphedema Wounding Event: Bump Etiology: Date Acquired: 05/03/2021 Wound Status: Open Weeks Of Treatment: 1 Comorbid Cataracts, Anemia, Asthma, Sleep Apnea, Coronary Clustered Wound: Yes History: Artery Disease, Osteoarthritis Photos Wound Measurements Length: (cm) 3.6 Width: (cm) 2.7 Depth: (cm) 0.2 Clustered Quantity: 2 Area: (cm) 7.634 Volume: (cm) 1.527 % Reduction in Area: -1% % Reduction in Volume: 32.6% Epithelialization: Large (67-100%) Tunneling: No Undermining: No Wound Description Classification: Full Thickness Without Exposed Support Structu Exudate Amount: Medium Exudate Type: Serosanguineous Exudate Color: red, brown res Foul Odor After Cleansing: No Slough/Fibrino Yes Wound Bed Granulation Amount: Medium (34-66%) Exposed Structure Granulation Quality: Red Fascia Exposed: No Necrotic Amount: Medium (34-66%) Fat Layer (Subcutaneous Tissue) Exposed: Yes Necrotic Quality: Adherent Slough Tendon Exposed: No Muscle Exposed: No Coughlin, [name] Bean ([id number]) Joint Exposed: No Bone Exposed: No Treatment Notes Wound #1 (Lower Leg) Wound Laterality: Left, Medial, Distal Cleanser Soap  and Water Discharge Instruction: Gently cleanse wound with antibacterial soap, rinse and pat dry  prior to dressing wounds Peri-Wound Care Topical Primary Dressing Hydrofera Blue Ready Transfer Foam, 2.5x2.5 (in/in) Discharge Instruction: Apply Hydrofera Blue Ready to wound bed as directed Secondary Dressing ABD Pad 5x9 (in/in) Discharge Instruction: Cover with ABD pad Secured With Compression Wrap Profore Lite LF 3 Multilayer Compression [place] Discharge Instruction: Apply 3 multi-layer wrap as prescribed. Compression Stockings Add-Ons Electronic Signature(s) Signed: 05/24/2021 4:47:24 PM By: Dolan Amen RN Entered By: Dolan Amen on 05/24/2021 12:01:56 [facility], [place] ([id number]) -------------------------------------------------------------------------------- Vitals Details Patient Name: [name] Bean Date of Service: 05/24/2021 10:45 AM Medical Record Number: [id number] Patient Account Number: [id number] Date of Birth/Sex: [birth year] (86 y.o. M) Treating RN: Dolan Amen Primary Care Beda Dula: Harrel Lemon Other Clinician: Referring Bessy Reaney: Harrel Lemon Treating Zyion Doxtater/Extender: Skipper Cliche in Treatment: 1 Vital Signs Time Taken: 11:28 Temperature (F): 98.6 Height (in): 73 Pulse (bpm): 73 Weight (lbs): 260 Respiratory Rate (breaths/min): 18 Body Mass Index (BMI): 34.3 Blood Pressure (mmHg): 142/82 Reference Range: 80 - 120 mg / dl Electronic Signature(s) Signed: 05/24/2021 4:47:24 PM By: Dolan Amen RN Entered By: Dolan Amen on 05/24/2021 11:28:41

## 2021-05-31 ENCOUNTER — Ambulatory Visit: Payer: Medicare Other | Admitting: Physician Assistant

## 2021-05-31 ENCOUNTER — Other Ambulatory Visit: Payer: Self-pay

## 2021-05-31 DIAGNOSIS — I872 Venous insufficiency (chronic) (peripheral): Secondary | ICD-10-CM | POA: Diagnosis not present

## 2021-05-31 NOTE — Progress Notes (Signed)
[name] Bean ([id number]) Visit Report for 05/31/2021 Arrival Information Details Patient Name: [name] Bean Date of Service: 05/31/2021 8:30 AM Medical Record Number: [id number] Patient Account Number: [id number] Date of Birth/Sex: [birth year] (86 y.o. M) Treating RN: Dolan Amen Primary Care Jaella Weinert: Harrel Lemon Other Clinician: Referring Carlin Attridge: Harrel Lemon Treating Meganne Rita/Extender: Skipper Cliche in Treatment: 2 Visit Information History Since Last Visit Pain Present Now: No Patient Arrived: Cane Arrival Time: 08:22 Accompanied By: self Transfer Assistance: None Patient Identification Verified: Yes Secondary Verification Process Completed: Yes Patient Has Alerts: Yes Patient Alerts: Patient on Blood Thinner Aspirin 81mg  Electronic Signature(s) Signed: 05/31/2021 11:01:32 AM By: Dolan Amen RN Entered By: Dolan Amen on 05/31/2021 08:56:13 [name] Bean ([id number]) -------------------------------------------------------------------------------- Clinic Level of Care Assessment Details Patient Name: [name] Bean Date of Service: 05/31/2021 8:30 AM Medical Record Number: [id number] Patient Account Number: [id number] Date of Birth/Sex: [birth year] (86 y.o. M) Treating RN: Dolan Amen Primary Care Obera Stauch: Harrel Lemon Other Clinician: Referring Victoria Euceda: Harrel Lemon Treating Anah Billard/Extender: Skipper Cliche in Treatment: 2 Clinic Level of Care Assessment Items TOOL 1 Quantity Score []  - Use when EandM and Procedure is performed on INITIAL visit 0 ASSESSMENTS - Nursing Assessment / Reassessment []  - General Physical Exam (combine w/ comprehensive assessment (listed just below) when performed on new 0 pt. evals) []  - 0 Comprehensive Assessment (HX, ROS, Risk Assessments, Wounds Hx, etc.) ASSESSMENTS - Wound and Skin Assessment / Reassessment []  - Dermatologic / Skin Assessment (not related to wound area) 0 ASSESSMENTS - Ostomy and/or Continence  Assessment and Care []  - Incontinence Assessment and Management 0 []  - 0 Ostomy Care Assessment and Management (repouching, etc.) PROCESS - Coordination of Care []  - Simple Patient / Family Education for ongoing care 0 []  - 0 Complex (extensive) Patient / Family Education for ongoing care []  - 0 Staff obtains Programmer, systems, Records, Test Results / Process Orders []  - 0 Staff telephones HHA, Nursing Homes / Clarify orders / etc []  - 0 Routine Transfer to another Facility (non-emergent condition) []  - 0 Routine Hospital Admission (non-emergent condition) []  - 0 New Admissions / Biomedical engineer / Ordering NPWT, Apligraf, etc. []  - 0 Emergency Hospital Admission (emergent condition) PROCESS - Special Needs []  - Pediatric / Minor Patient Management 0 []  - 0 Isolation Patient Management []  - 0 Hearing / Language / Visual special needs []  - 0 Assessment of Community assistance (transportation, D/C planning, etc.) []  - 0 Additional assistance / Altered mentation []  - 0 Support Surface(s) Assessment (bed, cushion, seat, etc.) INTERVENTIONS - Miscellaneous []  - External ear exam 0 []  - 0 Patient Transfer (multiple staff / Civil Service fast streamer / Similar devices) []  - 0 Simple Staple / Suture removal (25 or less) []  - 0 Complex Staple / Suture removal (26 or more) []  - 0 Hypo/Hyperglycemic Management (do not check if billed separately) []  - 0 Ankle / Brachial Index (ABI) - do not check if billed separately Has the patient been seen at the hospital within the last three years: Yes Total Score: 0 Level Of Care: ____ [name] Bean ([id number]) Electronic Signature(s) Signed: 05/31/2021 11:01:32 AM By: Dolan Amen RN Entered By: Dolan Amen on 05/31/2021 08:57:10 [facility], [place] ([id number]) -------------------------------------------------------------------------------- Compression Therapy Details Patient Name: [name] Bean Date of Service: 05/31/2021 8:30 AM Medical Record Number:  [id number] Patient Account Number: [id number] Date of Birth/Sex: 28-Apr-1935 (86 y.o. M) Treating RN: Dolan Amen Primary Care Kiyoshi Schaab: Harrel Lemon Other Clinician: Referring Noralyn Karim: Harrel Lemon Treating Erico Stan/Extender: Jeri Cos Weeks in  Treatment: 2 Compression Therapy Performed for Wound Assessment: Wound #1 Left,Distal,Medial Lower Leg Performed By: Cora Daniels, RN Compression Type: Three Layer Pre Treatment ABI: 1 Electronic Signature(s) Signed: 05/31/2021 11:01:32 AM By: Dolan Amen RN Entered By: Dolan Amen on 05/31/2021 08:56:44 [name] Bean ([id number]) -------------------------------------------------------------------------------- Encounter Discharge Information Details Patient Name: [name] Bean Date of Service: 05/31/2021 8:30 AM Medical Record Number: [id number] Patient Account Number: [id number] Date of Birth/Sex: 1934/10/01 (86 y.o. M) Treating RN: Dolan Amen Primary Care Avelardo Reesman: Harrel Lemon Other Clinician: Referring Britzy Graul: Harrel Lemon Treating Cloa Bushong/Extender: Skipper Cliche in Treatment: 2 Encounter Discharge Information Items Discharge Condition: Stable Ambulatory Status: Cane Discharge Destination: Home Transportation: Private Auto Accompanied By: self Schedule Follow-up Appointment: Yes Clinical Summary of Care: Electronic Signature(s) Signed: 05/31/2021 11:01:32 AM By: Dolan Amen RN Entered By: Dolan Amen on 05/31/2021 08:57:06 [facility], [place] ([id number]) -------------------------------------------------------------------------------- Wound Assessment Details Patient Name: [name] Bean Date of Service: 05/31/2021 8:30 AM Medical Record Number: [id number] Patient Account Number: [id number] Date of Birth/Sex: [birth year] (86 y.o. M) Treating RN: Dolan Amen Primary Care Aylyn Wenzler: Harrel Lemon Other Clinician: Referring Shawnte Demarest: Harrel Lemon Treating Avrian Delfavero/Extender: Jeri Cos Weeks in  Treatment: 2 Wound Status Wound Number: 1 Primary Etiology: Trauma, Other Wound Location: Left, Distal, Medial Lower Leg Secondary Etiology: Lymphedema Wounding Event: Bump Wound Status: Open Date Acquired: 05/03/2021 Weeks Of Treatment: 2 Clustered Wound: Yes Wound Measurements Length: (cm) 3.6 Width: (cm) 2.7 Depth: (cm) 0.2 Area: (cm) 7.634 Volume: (cm) 1.527 % Reduction in Area: -1% % Reduction in Volume: 32.6% Wound Description Classification: Full Thickness Without Exposed Support Structu Exudate Amount: Medium Exudate Type: Serosanguineous Exudate Color: red, brown res Treatment Notes Wound #1 (Lower Leg) Wound Laterality: Left, Medial, Distal Cleanser Soap and Water Discharge Instruction: Gently cleanse wound with antibacterial soap, rinse and pat dry prior to dressing wounds Peri-Wound Care Topical Primary Dressing Hydrofera Blue Ready Transfer Foam, 2.5x2.5 (in/in) Discharge Instruction: Apply Hydrofera Blue Ready to wound bed as directed Secondary Dressing ABD Pad 5x9 (in/in) Discharge Instruction: Cover with ABD pad Secured With Compression Wrap Profore Lite LF 3 Multilayer Compression Bandaging System Discharge Instruction: Apply 3 multi-layer wrap as prescribed. Compression Stockings Add-Ons Electronic Signature(s) Signed: 05/31/2021 11:01:32 AM By: Dolan Amen RN Entered By: Dolan Amen on 05/31/2021 08:56:21

## 2021-06-09 ENCOUNTER — Other Ambulatory Visit: Payer: Self-pay

## 2021-06-09 ENCOUNTER — Encounter: Payer: Medicare Other | Attending: Physician Assistant | Admitting: Physician Assistant

## 2021-06-09 DIAGNOSIS — I10 Essential (primary) hypertension: Secondary | ICD-10-CM | POA: Diagnosis not present

## 2021-06-09 DIAGNOSIS — L97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 layer exposed: Secondary | ICD-10-CM | POA: Diagnosis not present

## 2021-06-09 DIAGNOSIS — I890 Lymphedema, not elsewhere classified: Secondary | ICD-10-CM | POA: Insufficient documentation

## 2021-06-09 DIAGNOSIS — I872 Venous insufficiency (chronic) (peripheral): Secondary | ICD-10-CM | POA: Insufficient documentation

## 2021-06-09 NOTE — Progress Notes (Addendum)
[name] Bean, [name] Bean ([id number]) Visit Report for 06/09/2021 Chief Complaint Document Details Patient Name: [name] Bean, [name] Bean Date of Service: 06/09/2021 8:30 AM Medical Record Number: [id number] Patient Account Number: [id number] Date of Birth/Sex: 1934-09-11 (86 y.o. M) Treating RN: Donnamarie Poag Primary Care Provider: Harrel Lemon Other Clinician: Referring Provider: Harrel Lemon Treating Provider/Extender: Skipper Cliche in Treatment: 3 Information Obtained from: Patient Chief Complaint Left LE Ulcer Electronic Signature(s) Signed: 06/09/2021 8:52:28 AM By: Worthy Keeler PA-C Entered By: Worthy Keeler on 06/09/2021 08:52:27 [name] Bean ([id number]) -------------------------------------------------------------------------------- Debridement Details Patient Name: [name] Bean Date of Service: 06/09/2021 8:30 AM Medical Record Number: [id number] Patient Account Number: [id number] Date of Birth/Sex: Jul 18, 1935 (86 y.o. M) Treating RN: Donnamarie Poag Primary Care Provider: Harrel Lemon Other Clinician: Referring Provider: Harrel Lemon Treating Provider/Extender: Skipper Cliche in Treatment: 3 Debridement Performed for Wound #1 Left,Distal,Medial Lower Leg Assessment: Performed By: Physician Tommie Sams., PA-C Debridement Type: Debridement Level of Consciousness (Pre- Awake and Alert procedure): Pre-procedure Verification/Time Out Yes - 09:03 Taken: Start Time: 09:03 Pain Control: Lidocaine Total Area Debrided (L x W): 3 (cm) x 2 (cm) = 6 (cm) Tissue and other material Viable, Non-Viable, Slough, Subcutaneous, Biofilm, Slough debrided: Level: Skin/Subcutaneous Tissue Debridement Description: Excisional Instrument: Curette Bleeding: Minimum Hemostasis Achieved: Pressure Response to Treatment: Procedure was tolerated well Level of Consciousness (Post- Awake and Alert procedure): Post Debridement Measurements of Total Wound Length: (cm) 3.6 Width: (cm) 2.7 Depth: (cm)  0.2 Volume: (cm) 1.527 Character of Wound/Ulcer Post Debridement: Improved Post Procedure Diagnosis Same as Pre-procedure Electronic Signature(s) Signed: 06/09/2021 12:02:10 PM By: Donnamarie Poag Signed: 06/10/2021 9:28:39 AM By: Worthy Keeler PA-C Entered By: Donnamarie Poag on 06/09/2021 09:04:39 [name] Bean ([id number]) -------------------------------------------------------------------------------- HPI Details Patient Name: [name] Bean Date of Service: 06/09/2021 8:30 AM Medical Record Number: [id number] Patient Account Number: [id number] Date of Birth/Sex: 05-09-1935 (86 y.o. M) Treating RN: Donnamarie Poag Primary Care Provider: Harrel Lemon Other Clinician: Referring Provider: Harrel Lemon Treating Provider/Extender: Skipper Cliche in Treatment: 3 History of Present Illness HPI Description: 05/17/2021 upon evaluation today patient presents for initial evaluation here in the clinic concerning issues that he has been healing. This again is not terribly uncommon especially with the significant swelling that he is experiencing. He does not have the compression wraps or compression stockings he tells me has never been told he should wear those but he definitely should be wearing those. Fortunately there does not appear to be any evidence of active infection at this time which is great news systemically though locally I do see some evidence of maceration and there is some question as to whether or not honestly he may have some infection present. He definitely has some slough and biofilm that needs to be cleared away. Either way I think with appropriate compression we can definitely get things moving in the right direction. Patient does have a history of chronic venous stasis, lymphedema, and his wounds unfortunately are right in the lower extremity on the left where he does have significant swelling and lymphedema. This means this can make things somewhat more difficult to heal in general.  Other than this he also has a history of hypertension. 05/24/2021 upon evaluation today patient appears to be doing decently well in regard to his wound. He has been tolerating the dressing changes without complication. Fortunately there does not appear to be any evidence of active infection at this point which is great news and overall I am pleased in that regard. Nonetheless I do believe  that the patient is still having quite a bit of discomfort at least when I cleaned the wound but overall I think we are headed in the appropriate direction. No fevers, chills, nausea, vomiting, or diarrhea. 06/09/2021 upon evaluation today patient appears to be doing well with regard to his wound. He has been tolerating the dressing changes without complication. Fortunately there does not appear to be any signs of active infection which is great news and overall very pleased with where we stand today. Nonetheless there is been made to be some sharp debridement performed today. Also think that the patient probably needs a stronger compression I would recommend that we increase to a 4-layer compression wrap. Electronic Signature(s) Signed: 06/09/2021 9:08:58 AM By: Worthy Keeler PA-C Entered By: Worthy Keeler on 06/09/2021 09:08:57 [name] Bean ([id number]) -------------------------------------------------------------------------------- Physical Exam Details Patient Name: [name] Bean Date of Service: 06/09/2021 8:30 AM Medical Record Number: [id number] Patient Account Number: [id number] Date of Birth/Sex: [birth year] (86 y.o. M) Treating RN: Donnamarie Poag Primary Care Provider: Harrel Lemon Other Clinician: Referring Provider: Harrel Lemon Treating Provider/Extender: Jeri Cos Weeks in Treatment: 3 Constitutional Well-nourished and well-hydrated in no acute distress. Respiratory normal breathing without difficulty. Psychiatric this patient is able to make decisions and demonstrates good insight into  disease process. Alert and Oriented x 3. pleasant and cooperative. Notes Upon inspection patient's wound bed actually showed signs of good granulation and epithelization there was some slough and biofilm noted I did perform sharp debridement to clear this away today and he tolerated that without complication. Post debridement wound bed appears to be doing much better which is great news. Electronic Signature(s) Signed: 06/09/2021 9:09:23 AM By: Worthy Keeler PA-C Entered By: Worthy Keeler on 06/09/2021 09:09:23 [name] Bean ([id number]) -------------------------------------------------------------------------------- Physician Orders Details Patient Name: [name] Bean Date of Service: 06/09/2021 8:30 AM Medical Record Number: [id number] Patient Account Number: [id number] Date of Birth/Sex: March 04, 1935 (86 y.o. M) Treating RN: Donnamarie Poag Primary Care Provider: Harrel Lemon Other Clinician: Referring Provider: Harrel Lemon Treating Provider/Extender: Skipper Cliche in Treatment: 3 Verbal / Phone Orders: No Diagnosis Coding ICD-10 Coding Code Description I87.2 Venous insufficiency (chronic) (peripheral) I89.0 Lymphedema, not elsewhere classified L97.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 layer exposed [place] (primary) hypertension Follow-up Appointments o Return Appointment in 2 weeks. o Nurse Visit as needed - Next week Bathing/ Shower/ Hygiene o May shower with wound dressing protected with water repellent cover or cast protector. o No tub bath. Edema Control - Lymphedema / Segmental Compressive Device / Other o Patient to wear own compression stockings. Remove compression stockings every night before going to bed and put on every morning when getting up. - right leg o Elevate legs to the level of the heart and pump ankles as often as possible o Elevate leg(s) parallel to the floor when sitting. o DO YOUR BEST to sleep  in the bed at night. DO NOT sleep in your recliner. Long hours of sitting in a recliner leads to swelling of the legs and/or potential wounds on your backside. Wound Treatment Wound #1 - Lower Leg Wound Laterality: Left, Medial, Distal Cleanser: Soap and Water 1 x Per Week/30 Days Discharge Instructions: Gently cleanse wound with antibacterial soap, rinse and pat dry prior to dressing wounds Primary Dressing: Hydrofera Blue Ready Transfer Foam, 2.5x2.5 (in/in) 1 x Per Week/30 Days Discharge Instructions: Apply Hydrofera Blue Ready to wound bed as directed Secondary Dressing: ABD Pad 5x9 (in/in) 1 x  Per Week/30 Days Discharge Instructions: Cover with ABD pad Compression Wrap: Medichoice 4 layer Compression System, 35-40 mmHG 1 x Per Week/30 Days Discharge Instructions: Apply multi-layer wrap as directed. Electronic Signature(s) Signed: 06/09/2021 12:02:10 PM By: Donnamarie Poag Signed: 06/10/2021 9:28:39 AM By: Worthy Keeler PA-C Entered By: Donnamarie Poag on 06/09/2021 09:05:35 [name] Bean ([id number]) -------------------------------------------------------------------------------- Problem List Details Patient Name: [name] Bean Date of Service: 06/09/2021 8:30 AM Medical Record Number: [id number] Patient Account Number: [id number] Date of Birth/Sex: August 15, 1935 (86 y.o. M) Treating RN: Donnamarie Poag Primary Care Provider: Harrel Lemon Other Clinician: Referring Provider: Harrel Lemon Treating Provider/Extender: Skipper Cliche in Treatment: 3 Active Problems ICD-10 Encounter Code Description Active Date MDM Diagnosis I87.2 Venous insufficiency (chronic) (peripheral) 05/17/2021 No Yes I89.0 Lymphedema, not elsewhere classified 05/17/2021 No Yes L97.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 layer 05/17/2021 No Yes exposed [place] (primary) hypertension 05/17/2021 No Yes Inactive Problems Resolved Problems Electronic Signature(s) Signed: 06/09/2021  8:52:11 AM By: Worthy Keeler PA-C Entered By: Worthy Keeler on 06/09/2021 08:52:11 [name] Bean, [name] Bean ([id number]) -------------------------------------------------------------------------------- Progress Note Details Patient Name: [name] Bean Date of Service: 06/09/2021 8:30 AM Medical Record Number: [id number] Patient Account Number: [id number] Date of Birth/Sex: 1934/11/13 (86 y.o. M) Treating RN: Donnamarie Poag Primary Care Provider: Harrel Lemon Other Clinician: Referring Provider: Harrel Lemon Treating Provider/Extender: Skipper Cliche in Treatment: 3 Subjective Chief Complaint Information obtained from Patient Left LE Ulcer History of Present Illness (HPI) 05/17/2021 upon evaluation today patient presents for initial evaluation here in the clinic concerning issues that he has been healing. This again is not terribly uncommon especially with the significant swelling that he is experiencing. He does not have the compression wraps or compression stockings he tells me has never been told he should wear those but he definitely should be wearing those. Fortunately there does not appear to be any evidence of active infection at this time which is great news systemically though locally I do see some evidence of maceration and there is some question as to whether or not honestly he may have some infection present. He definitely has some slough and biofilm that needs to be cleared away. Either way I think with appropriate compression we can definitely get things moving in the right direction. Patient does have a history of chronic venous stasis, lymphedema, and his wounds unfortunately are right in the lower extremity on the left where he does have significant swelling and lymphedema. This means this can make things somewhat more difficult to heal in general. Other than this he also has a history of hypertension. 05/24/2021 upon evaluation today patient appears to be doing decently well in  regard to his wound. He has been tolerating the dressing changes without complication. Fortunately there does not appear to be any evidence of active infection at this point which is great news and overall I am pleased in that regard. Nonetheless I do believe that the patient is still having quite a bit of discomfort at least when I cleaned the wound but overall I think we are headed in the appropriate direction. No fevers, chills, nausea, vomiting, or diarrhea. 06/09/2021 upon evaluation today patient appears to be doing well with regard to his wound. He has been tolerating the dressing changes without complication. Fortunately there does not appear to be any signs of active infection which is great news and overall very pleased with where we stand today. Nonetheless there is been made to be some sharp debridement  performed today. Also think that the patient probably needs a stronger compression I would recommend that we increase to a 4-layer compression wrap. Objective Constitutional Well-nourished and well-hydrated in no acute distress. Vitals Time Taken: 8:34 AM, Height: 73 in, Weight: 260 lbs, BMI: 34.3, Temperature: 98.6 F, Pulse: 66 bpm, Respiratory Rate: 20 breaths/min, Blood Pressure: 136/89 mmHg. Respiratory normal breathing without difficulty. Psychiatric this patient is able to make decisions and demonstrates good insight into disease process. Alert and Oriented x 3. pleasant and cooperative. General Notes: Upon inspection patient's wound bed actually showed signs of good granulation and epithelization there was some slough and biofilm noted I did perform sharp debridement to clear this away today and he tolerated that without complication. Post debridement wound bed appears to be doing much better which is great news. Integumentary (Hair, Skin) Wound #1 status is Open. Original cause of wound was Bump. The date acquired was: 05/03/2021. The wound has been in treatment 3 weeks.  The wound is located on the Left,Distal,Medial Lower Leg. The wound measures 3.6cm length x 2.7cm width x 0.2cm depth; 7.634cm^2 area and 1.527cm^3 volume. There is Fat Layer (Subcutaneous Tissue) exposed. There is no tunneling or undermining noted. There is a medium amount of serosanguineous drainage noted. There is large (67-100%) red, pink granulation within the wound bed. There is a small (1-33%) amount of necrotic tissue within the wound bed including Adherent Slough. [facility], [name] ([id number]) Assessment Active Problems ICD-10 Venous insufficiency (chronic) (peripheral) Lymphedema, not elsewhere classified Non-pressure chronic ulcer of other part of left lower leg with fat layer exposed [place] (primary) hypertension Procedures Wound #1 Pre-procedure diagnosis of Wound #1 is a Trauma, Other located on the Left,Distal,Medial Lower Leg . There was a Excisional Skin/Subcutaneous Tissue Debridement with a total area of 6 sq cm performed by Tommie Sams., PA-C. With the following instrument(s): Curette to remove Viable and Non-Viable tissue/material. Material removed includes Subcutaneous Tissue, Slough, and Biofilm after achieving pain control using Lidocaine. A time out was conducted at 09:03, prior to the start of the procedure. A Minimum amount of bleeding was controlled with Pressure. The procedure was tolerated well. Post Debridement Measurements: 3.6cm length x 2.7cm width x 0.2cm depth; 1.527cm^3 volume. Character of Wound/Ulcer Post Debridement is improved. Post procedure Diagnosis Wound #1: Same as Pre-Procedure Pre-procedure diagnosis of Wound #1 is a Trauma, Other located on the Left,Distal,Medial Lower Leg . There was a Four Layer Compression Therapy Procedure by Donnamarie Poag, RN. Post procedure Diagnosis Wound #1: Same as Pre-Procedure Plan Follow-up Appointments: Return Appointment in 2 weeks. Nurse Visit as needed - Next week Bathing/ Shower/ Hygiene: May shower with  wound dressing protected with water repellent cover or cast protector. No tub bath. Edema Control - Lymphedema / Segmental Compressive Device / Other: Patient to wear own compression stockings. Remove compression stockings every night before going to bed and put on every morning when getting up. - right leg Elevate legs to the level of the heart and pump ankles as often as possible Elevate leg(s) parallel to the floor when sitting. DO YOUR BEST to sleep in the bed at night. DO NOT sleep in your recliner. Long hours of sitting in a recliner leads to swelling of the legs and/or potential wounds on your backside. WOUND #1: - Lower Leg Wound Laterality: Left, Medial, Distal Cleanser: Soap and Water 1 x Per Week/30 Days Discharge Instructions: Gently cleanse wound with antibacterial soap, rinse and pat dry prior to dressing wounds Primary Dressing: Hydrofera Blue Ready  Transfer Foam, 2.5x2.5 (in/in) 1 x Per Week/30 Days Discharge Instructions: Apply Hydrofera Blue Ready to wound bed as directed Secondary Dressing: ABD Pad 5x9 (in/in) 1 x Per Week/30 Days Discharge Instructions: Cover with ABD pad Compression Wrap: Medichoice 4 layer Compression System, 35-40 mmHG 1 x Per Week/30 Days Discharge Instructions: Apply multi-layer wrap as directed. 1. Would recommend that we going continue with wound care measures as before and the patient is in agreement with that plan. This includes the use of the [facility] dressing which I think is doing a great job. 2. I am also can recommend that we have the patient continue to monitor for any evidence of active infection such as increased pain. If any of this occurs he should let me know as soon as possible. 3. I am also can recommend that he should be elevating his legs much as possible to help with edema control when he is at home and resting I do believe he is okay to do physical therapy however. We will see patient back for reevaluation in 1 week here in  the clinic. If anything worsens or changes patient will contact our office for additional recommendations. [name] Bean, [name] Bean ([id number]) Electronic Signature(s) Signed: 06/09/2021 9:10:00 AM By: Worthy Keeler PA-C Entered By: Worthy Keeler on 06/09/2021 09:10:00 [facility], [place] ([id number]) -------------------------------------------------------------------------------- SuperBill Details Patient Name: [name] Bean Date of Service: 06/09/2021 Medical Record Number: [id number] Patient Account Number: [id number] Date of Birth/Sex: [birth year] (86 y.o. M) Treating RN: Donnamarie Poag Primary Care Provider: Harrel Lemon Other Clinician: Referring Provider: Harrel Lemon Treating Provider/Extender: Skipper Cliche in Treatment: 3 Diagnosis Coding ICD-10 Codes Code Description I87.2 Venous insufficiency (chronic) (peripheral) I89.0 Lymphedema, not elsewhere classified L97.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 layer exposed [place] (primary) hypertension Facility Procedures CPT4 Code: [id number] Description: 57322 - DEB SUBQ TISSUE 20 SQ CM/< Modifier: Quantity: 1 CPT4 Code: Description: ICD-10 Diagnosis Description L97.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 layer Modifier: exposed Quantity: Physician Procedures CPT4 Code: [id number] Description: 62376 - WC PHYS SUBQ TISS 20 SQ CM Modifier: Quantity: 1 CPT4 Code: Description: ICD-10 Diagnosis Description L97.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 layer Modifier: exposed Quantity: Electronic Signature(s) Signed: 06/09/2021 9:10:11 AM By: Worthy Keeler PA-C Entered By: Worthy Keeler on 06/09/2021 09:10:11

## 2021-06-09 NOTE — Progress Notes (Signed)
ZAC, TORTI ([id number]) Visit Report for 06/09/2021 Arrival Information Details Patient Name: [name] Bean, [name] Bean Date of Service: 06/09/2021 8:30 AM Medical Record Number: [id number] Patient Account Number: [id number] Date of Birth/Sex: November 26, 1934 (86 y.o. M) Treating RN: Donnamarie Poag Primary Care Raja Liska: Harrel Lemon Other Clinician: Referring Brandice Busser: Harrel Lemon Treating Dustee Bottenfield/Extender: Skipper Cliche in Treatment: 3 Visit Information History Since Last Visit Added or deleted any medications: No Patient Arrived: Kasandra Knudsen Had a fall or experienced change in No Arrival Time: 08:32 activities of daily living that may affect Accompanied By: self risk of falls: Transfer Assistance: None Hospitalized since last visit: No Patient Identification Verified: Yes Has Dressing in Place as Prescribed: Yes Secondary Verification Process Completed: Yes Has Compression in Place as Prescribed: Yes Patient Has Alerts: Yes Pain Present Now: Yes Patient Alerts: Patient on Blood Thinner Aspirin 81mg  Electronic Signature(s) Signed: 06/09/2021 12:02:10 PM By: Donnamarie Poag Entered By: Donnamarie Poag on 06/09/2021 08:34:00 [name] Bean ([id number]) -------------------------------------------------------------------------------- Clinic Level of Care Assessment Details Patient Name: [name] Bean Date of Service: 06/09/2021 8:30 AM Medical Record Number: [id number] Patient Account Number: [id number] Date of Birth/Sex: August 18, 1935 (86 y.o. M) Treating RN: Donnamarie Poag Primary Care Azam Gervasi: Harrel Lemon Other Clinician: Referring Ayauna Mcnay: Harrel Lemon Treating Kyree Adriano/Extender: Skipper Cliche in Treatment: 3 Clinic Level of Care Assessment Items TOOL 1 Quantity Score []  - Use when EandM and Procedure is performed on INITIAL visit 0 ASSESSMENTS - Nursing Assessment / Reassessment []  - General Physical Exam (combine w/ comprehensive assessment (listed just below) when performed on new 0 pt.  evals) []  - 0 Comprehensive Assessment (HX, ROS, Risk Assessments, Wounds Hx, etc.) ASSESSMENTS - Wound and Skin Assessment / Reassessment []  - Dermatologic / Skin Assessment (not related to wound area) 0 ASSESSMENTS - Ostomy and/or Continence Assessment and Care []  - Incontinence Assessment and Management 0 []  - 0 Ostomy Care Assessment and Management (repouching, etc.) PROCESS - Coordination of Care []  - Simple Patient / Family Education for ongoing care 0 []  - 0 Complex (extensive) Patient / Family Education for ongoing care []  - 0 Staff obtains Programmer, systems, Records, Test Results / Process Orders []  - 0 Staff telephones HHA, Nursing Homes / Clarify orders / etc []  - 0 Routine Transfer to another Facility (non-emergent condition) []  - 0 Routine Hospital Admission (non-emergent condition) []  - 0 New Admissions / Biomedical engineer / Ordering NPWT, Apligraf, etc. []  - 0 Emergency Hospital Admission (emergent condition) PROCESS - Special Needs []  - Pediatric / Minor Patient Management 0 []  - 0 Isolation Patient Management []  - 0 Hearing / Language / Visual special needs []  - 0 Assessment of Community assistance (transportation, D/C planning, etc.) []  - 0 Additional assistance / Altered mentation []  - 0 Support Surface(s) Assessment (bed, cushion, seat, etc.) INTERVENTIONS - Miscellaneous []  - External ear exam 0 []  - 0 Patient Transfer (multiple staff / Civil Service fast streamer / Similar devices) []  - 0 Simple Staple / Suture removal (25 or less) []  - 0 Complex Staple / Suture removal (26 or more) []  - 0 Hypo/Hyperglycemic Management (do not check if billed separately) []  - 0 Ankle / Brachial Index (ABI) - do not check if billed separately Has the patient been seen at the hospital within the last three years: Yes Total Score: 0 Level Of Care: ____ [name] Bean ([id number]) Electronic Signature(s) Signed: 06/09/2021 12:02:10 PM By: Donnamarie Poag Entered By: Donnamarie Poag on  06/09/2021 09:05:41 [facility], [name] ([id number]) -------------------------------------------------------------------------------- Compression Therapy Details Patient Name: [name] Bean Date of Service: 06/09/2021 8:30  AM Medical Record Number: [id number] Patient Account Number: [id number] Date of Birth/Sex: [birth year] (86 y.o. M) Treating RN: Donnamarie Poag Primary Care Shakerra Red: Harrel Lemon Other Clinician: Referring Jetaime Pinnix: Harrel Lemon Treating Jaimee Corum/Extender: Skipper Cliche in Treatment: 3 Compression Therapy Performed for Wound Assessment: Wound #1 Left,Distal,Medial Lower Leg Performed By: Clinician Donnamarie Poag, RN Compression Type: Four Layer Post Procedure Diagnosis Same as Pre-procedure Electronic Signature(s) Signed: 06/09/2021 12:02:10 PM By: Donnamarie Poag Entered By: Donnamarie Poag on 06/09/2021 09:04:48 [name] Bean ([id number]) -------------------------------------------------------------------------------- Encounter Discharge Information Details Patient Name: [name] Bean Date of Service: 06/09/2021 8:30 AM Medical Record Number: [id number] Patient Account Number: [id number] Date of Birth/Sex: 01/05/1935 (86 y.o. M) Treating RN: Donnamarie Poag Primary Care Massie Cogliano: Harrel Lemon Other Clinician: Referring Keneth Borg: Harrel Lemon Treating Zakar Brosch/Extender: Skipper Cliche in Treatment: 3 Encounter Discharge Information Items Post Procedure Vitals Discharge Condition: Stable Temperature (F): 98.6 Ambulatory Status: Cane Pulse (bpm): 66 Discharge Destination: Home Respiratory Rate (breaths/min): 20 Transportation: Private Auto Blood Pressure (mmHg): 136/89 Accompanied By: self Schedule Follow-up Appointment: Yes Clinical Summary of Care: Electronic Signature(s) Signed: 06/09/2021 12:02:10 PM By: Donnamarie Poag Entered By: Donnamarie Poag on 06/09/2021 09:17:24 [name] Bean  ([id number]) -------------------------------------------------------------------------------- Lower Extremity Assessment Details Patient Name: [name] Bean Date of Service: 06/09/2021 8:30 AM Medical Record Number: [id number] Patient Account Number: [id number] Date of Birth/Sex: 1935/07/23 (86 y.o. M) Treating RN: Donnamarie Poag Primary Care Artyom Stencel: Harrel Lemon Other Clinician: Referring Dariela Stoker: Harrel Lemon Treating Hyde Sires/Extender: Jeri Cos Weeks in Treatment: 3 Edema Assessment Assessed: [Left: Yes] [Right: No] Edema: [Left: Ye] [Right: s] Calf Left: Right: Point of Measurement: 40 cm From Medial Instep 46 cm Ankle Left: Right: Point of Measurement: 14 cm From Medial Instep 30.5 cm Knee To Floor Left: Right: From Medial Instep 45 cm Vascular Assessment Pulses: Dorsalis Pedis Palpable: [Left:No Yes] Electronic Signature(s) Signed: 06/09/2021 12:02:10 PM By: Donnamarie Poag Entered ByDonnamarie Poag on 06/09/2021 08:41:27 [facility], [name] ([id number]) -------------------------------------------------------------------------------- Multi Wound Chart Details Patient Name: [name] Bean Date of Service: 06/09/2021 8:30 AM Medical Record Number: [id number] Patient Account Number: [id number] Date of Birth/Sex: [birth year] (86 y.o. M) Treating RN: Donnamarie Poag Primary Care Ashby Leflore: Harrel Lemon Other Clinician: Referring Calah Gershman: Harrel Lemon Treating Jaesean Litzau/Extender: Skipper Cliche in Treatment: 3 Vital Signs Height(in): 73 Pulse(bpm): 72 Weight(lbs): 260 Blood Pressure(mmHg): 136/89 Body Mass Index(BMI): 34 Temperature(F): 98.6 Respiratory Rate(breaths/min): 20 Photos: [N/A:N/A] Wound Location: Left, Distal, Medial Lower Leg N/A N/A Wounding Event: Bump N/A N/A Primary Etiology: Trauma, Other N/A N/A Secondary Etiology: Lymphedema N/A N/A Comorbid History: Cataracts, Anemia, Asthma, Sleep N/A N/A Apnea, Coronary Artery Disease, Osteoarthritis Date Acquired:  05/03/2021 N/A N/A Weeks of Treatment: 3 N/A N/A Wound Status: Open N/A N/A Clustered Wound: Yes N/A N/A Measurements L x W x D (cm) 3.6x2.7x0.2 N/A N/A Area (cm) : 7.634 N/A N/A Volume (cm) : 1.527 N/A N/A % Reduction in Area: -1.00% N/A N/A % Reduction in Volume: 32.60% N/A N/A Classification: Full Thickness Without Exposed N/A N/A Support Structures Exudate Amount: Medium N/A N/A Exudate Type: Serosanguineous N/A N/A Exudate Color: red, brown N/A N/A Granulation Amount: Large (67-100%) N/A N/A Granulation Quality: Red, Pink N/A N/A Necrotic Amount: Small (1-33%) N/A N/A Exposed Structures: Fat Layer (Subcutaneous Tissue): N/A N/A Yes Fascia: No Tendon: No Muscle: No Joint: No Bone: No Treatment Notes Electronic Signature(s) Signed: 06/09/2021 12:02:10 PM By: Donnamarie Poag Entered By: Donnamarie Poag on 06/09/2021 09:01:20 [name] Bean ([id number]) -------------------------------------------------------------------------------- Multi-Disciplinary Care Plan Details Patient Name: [name] Bean Date of Service: 06/09/2021 8:30 AM Medical  Record Number: [id number] Patient Account Number: [id number] Date of Birth/Sex: [birth year] (86 y.o. M) Treating RN: Donnamarie Poag Primary Care [place]ha Behlke: Harrel Lemon Other Clinician: Referring Shlomie Romig: Harrel Lemon Treating Harkirat Orozco/Extender: Skipper Cliche in Treatment: 3 Active Inactive Wound/Skin Impairment Nursing Diagnoses: Impaired tissue integrity Knowledge deficit related to smoking impact on wound healing Knowledge deficit related to ulceration/compromised skin integrity Goals: Ulcer/skin breakdown will have a volume reduction of 30% by week 4 Date Initiated: 05/17/2021 Target Resolution Date: 06/16/2021 Goal Status: Active Ulcer/skin breakdown will have a volume reduction of 50% by week 8 Date Initiated: 05/17/2021 Target Resolution Date: 07/17/2021 Goal Status: Active Ulcer/skin breakdown will have a volume reduction of 80%  by week 12 Date Initiated: 05/17/2021 Target Resolution Date: 08/16/2021 Goal Status: Active Ulcer/skin breakdown will heal within 14 weeks Date Initiated: 05/17/2021 Target Resolution Date: 08/30/2021 Goal Status: Active Interventions: Assess patient/caregiver ability to obtain necessary supplies Assess patient/caregiver ability to perform ulcer/skin care regimen upon admission and as needed Assess ulceration(s) every visit Notes: Electronic Signature(s) Signed: 06/09/2021 12:02:10 PM By: Donnamarie Poag Entered By: Donnamarie Poag on 06/09/2021 09:01:00 [name] Bean ([id number]) -------------------------------------------------------------------------------- Pain Assessment Details Patient Name: [name] Bean Date of Service: 06/09/2021 8:30 AM Medical Record Number: [id number] Patient Account Number: [id number] Date of Birth/Sex: 10/23/1934 (86 y.o. M) Treating RN: Donnamarie Poag Primary Care Ramin Zoll: Harrel Lemon Other Clinician: Referring Facundo Allemand: Harrel Lemon Treating Phinley Schall/Extender: Skipper Cliche in Treatment: 3 Active Problems Location of Pain Severity and Description of Pain Patient Has Paino Yes Site Locations Pain Location: Generalized Pain Rate the pain. Current Pain Level: 8 Pain Management and Medication Current Pain Management: Electronic Signature(s) Signed: 06/09/2021 12:02:10 PM By: Donnamarie Poag Entered By: Donnamarie Poag on 06/09/2021 08:34:53 [facility], [name] ([id number]) -------------------------------------------------------------------------------- Patient/Caregiver Education Details Patient Name: [name] Bean Date of Service: 06/09/2021 8:30 AM Medical Record Number: [id number] Patient Account Number: [id number] Date of Birth/Gender: 08/29/1935 (86 y.o. M) Treating RN: Donnamarie Poag Primary Care Physician: Harrel Lemon Other Clinician: Referring Physician: Harrel Lemon Treating Physician/Extender: Skipper Cliche in Treatment: 3 Education  Assessment Education Provided To: Patient Education Topics Provided Wound Debridement: Wound/Skin Impairment: Electronic Signature(s) Signed: 06/09/2021 12:02:10 PM By: Donnamarie Poag Entered By: Donnamarie Poag on 06/09/2021 09:06:19 [name] Bean ([id number]) -------------------------------------------------------------------------------- Wound Assessment Details Patient Name: [name] Bean Date of Service: 06/09/2021 8:30 AM Medical Record Number: [id number] Patient Account Number: [id number] Date of Birth/Sex: 02-17-1935 (86 y.o. M) Treating RN: Donnamarie Poag Primary Care Stella Encarnacion: Harrel Lemon Other Clinician: Referring Pinki Rottman: Harrel Lemon Treating Lakaisha Danish/Extender: Jeri Cos Weeks in Treatment: 3 Wound Status Wound Number: 1 Primary Trauma, Other Etiology: Wound Location: Left, Distal, Medial Lower Leg Secondary Lymphedema Wounding Event: Bump Etiology: Date Acquired: 05/03/2021 Wound Status: Open Weeks Of Treatment: 3 Comorbid Cataracts, Anemia, Asthma, Sleep Apnea, Coronary Clustered Wound: Yes History: Artery Disease, Osteoarthritis Photos Wound Measurements Length: (cm) 3.6 Width: (cm) 2.7 Depth: (cm) 0.2 Area: (cm) 7.634 Volume: (cm) 1.527 % Reduction in Area: -1% % Reduction in Volume: 32.6% Tunneling: No Undermining: No Wound Description Classification: Full Thickness Without Exposed Support Structu Exudate Amount: Medium Exudate Type: Serosanguineous Exudate Color: red, brown res Foul Odor After Cleansing: No Slough/Fibrino Yes Wound Bed Granulation Amount: Large (67-100%) Exposed Structure Granulation Quality: Red, Pink Fascia Exposed: No Necrotic Amount: Small (1-33%) Fat Layer (Subcutaneous Tissue) Exposed: Yes Necrotic Quality: Adherent Slough Tendon Exposed: No Muscle Exposed: No Joint Exposed: No Bone Exposed: No Treatment Notes Wound #1 (Lower Leg) Wound Laterality: Left, Medial, Distal Cleanser Soap and Water Discharge Instruction:  Gently cleanse  wound with antibacterial soap, rinse and pat dry prior to dressing wounds Peri-Wound Care SHALAMAR, CRAYS ([id number]) Topical Primary Dressing Hydrofera Blue Ready Transfer Foam, 2.5x2.5 (in/in) Discharge Instruction: Apply Hydrofera Blue Ready to wound bed as directed Secondary Dressing ABD Pad 5x9 (in/in) Discharge Instruction: Cover with ABD pad Secured With Compression Wrap Medichoice 4 layer Compression System, 35-40 mmHG Discharge Instruction: Apply multi-layer wrap as directed. Compression Stockings Add-Ons Electronic Signature(s) Signed: 06/09/2021 12:02:10 PM By: Donnamarie Poag Entered By: Donnamarie Poag on 06/09/2021 08:40:21 [facility], [name] ([id number]) -------------------------------------------------------------------------------- Vitals Details Patient Name: [name] Bean Date of Service: 06/09/2021 8:30 AM Medical Record Number: [id number] Patient Account Number: [id number] Date of Birth/Sex: 1935/04/05 (86 y.o. M) Treating RN: Donnamarie Poag Primary Care Atlee Villers: Harrel Lemon Other Clinician: Referring Tong Pieczynski: Harrel Lemon Treating Lyriq Finerty/Extender: Skipper Cliche in Treatment: 3 Vital Signs Time Taken: 08:34 Temperature (F): 98.6 Height (in): 73 Pulse (bpm): 66 Weight (lbs): 260 Respiratory Rate (breaths/min): 20 Body Mass Index (BMI): 34.3 Blood Pressure (mmHg): 136/89 Reference Range: 80 - 120 mg / dl Electronic Signature(s) Signed: 06/09/2021 12:02:10 PM By: Donnamarie Poag Entered ByDonnamarie Poag on 06/09/2021 08:34:43

## 2021-06-09 NOTE — Progress Notes (Signed)
06/10/2021 9:37 AM   [name] Bean 19-Oct-1934 [id number]  Referring provider: Baxter Hire, MD [place],  [place] [phone]  Chief Complaint  Patient presents with   Urinary Frequency    Urological history: 1. BPH with LU TS -PSA 0.22 in 04/2021 -cysto 01/2021 -  TUR changes with adenoma regrowth prostate and moderate trabeculation -chronic urinary frequency, urgency, urge incontinence -no significant provement tamsulosin, oxybutynin and Myrbetriq   2.  Chronic bacteriuria - Only complains of malodorous urine   3.  History of hypogonadism - Previously on TRT  HPI: [name] Bean is a 86 y.o. male who presents today for frequent and painful urination.  He complains of frequent urination for months that comes and goes.  He is also experiencing suprapubic pain that comes and goes that has been going on for months.    Patient denies any modifying or aggravating factors.  Patient denies any gross hematuria or suprapubic/flank pain.  Patient denies any fevers, chills, nausea or vomiting.    UA nitrite positive, > 30 WBC's and many bacteria.    PVR 66 mL   He is a difficult historian as he had a difficult time staying on topic and perseverates on his leg swelling and back pain.   PMH: Past Medical History:  Diagnosis Date   Arthritis    left knee;    Hyperlipidemia    Urethral stricture     Surgical History: No past surgical history on file.  Home Medications:  Allergies as of 06/10/2021       Reactions   Amoxicillin Swelling   Amoxicillin-pot Clavulanate Hives, Shortness Of Breath, Swelling   Pt states he had to go to the hospital because of this situation Pt states he had to go to the hospital because of this situation   Mecobalamin Nausea Only        Medication List        Accurate as of June 10, 2021  9:37 AM. If you have any questions, ask your nurse or doctor.          albuterol 108 (90 Base) MCG/ACT inhaler Commonly  known as: VENTOLIN HFA Inhale into the lungs.   aspirin EC 81 MG tablet Take 81 mg by mouth.   augmented betamethasone dipropionate 0.05 % ointment Commonly known as: DIPROLENE-AF APPLY TO AFFECTED AREAS TWICE DAILY UNTIL CLEAR   azelastine 0.1 % nasal spray Commonly known as: ASTELIN SPRAY ONCE IN EACH NOSTRIL TWICE DAILY   bisacodyl 5 MG EC tablet Commonly known as: DULCOLAX daily.   diclofenac Sodium 1 % Gel Commonly known as: VOLTAREN APPLY 4 GRAMS TO AFFECTED AREA 4 TIMES A DAY   docusate sodium 100 MG capsule Commonly known as: COLACE Take by mouth.   doxycycline 100 MG capsule Commonly known as: VIBRAMYCIN Take 100 mg by mouth 2 (two) times daily.   DULoxetine 20 MG capsule Commonly known as: CYMBALTA Take 20 mg by mouth daily.   fluticasone 50 MCG/ACT nasal spray Commonly known as: FLONASE SPRAY 2 SPRAYS INTO EACH NOSTRIL EVERY DAY   gabapentin 300 MG capsule Commonly known as: NEURONTIN Take 300 mg by mouth 2 (two) times daily.   loratadine 10 MG tablet Commonly known as: CLARITIN TAKE 1 TABLET (10 MG TOTAL) BY MOUTH ONCE DAILY. (NOT COVER)   meloxicam 15 MG tablet Commonly known as: MOBIC Take 15 mg by mouth daily.   methenamine 1 g tablet Commonly known as: HIPREX Take 1 tablet (1 g  total) by mouth 2 (two) times daily with a meal.   metolazone 2.5 MG tablet Commonly known as: ZAROXOLYN Take 2.5 mg by mouth every other day.   mupirocin ointment 2 % Commonly known as: BACTROBAN APPLY TO AFFECTED AREA 3 TIMES A DAY FOR 7 DAYS   omeprazole 40 MG capsule Commonly known as: PRILOSEC Take 40 mg by mouth daily.   oxybutynin 10 MG 24 hr tablet Commonly known as: DITROPAN-XL Take 10 mg by mouth daily.   oxyCODONE-acetaminophen 10-325 MG tablet Commonly known as: PERCOCET Take 1 tablet by mouth 4 (four) times daily as needed.   potassium chloride SA 20 MEQ tablet Commonly known as: KLOR-CON TAKE 1 TABLET (20 MEQ TOTAL) BY MOUTH 3 (THREE)  TIMES DAILY.   tamsulosin 0.4 MG Caps capsule Commonly known as: FLOMAX Take 0.4 mg by mouth daily.   Testosterone 10 MG/ACT (2%) Gel Apply 20 mg topically daily.   torsemide 20 MG tablet Commonly known as: DEMADEX Take 1 tablet by mouth daily.   triamcinolone 0.025 % cream Commonly known as: KENALOG triamcinolone acetonide 0.025 % topical cream   Vitamin D3 25 MCG (1000 UT) Caps Take by mouth.        Allergies:  Allergies  Allergen Reactions   Amoxicillin Swelling   Amoxicillin-Pot Clavulanate Hives, Shortness Of Breath and Swelling    Pt states he had to go to the hospital because of this situation Pt states he had to go to the hospital because of this situation    Mecobalamin Nausea Only    Family History: No family history on file.  Social History:  reports that he quit smoking about 40 years ago. His smoking use included cigarettes. He has never used smokeless tobacco. He reports that he does not drink alcohol and does not use drugs.  ROS: Pertinent ROS in HPI  Physical Exam: BP (!) 142/88   Pulse 77   Ht 6' (1.829 m)   Wt 256 lb (116.1 kg)   BMI 34.72 kg/m   Constitutional:  Well nourished. Alert and oriented, No acute distress. HEENT: [place] AT, mask in place.  Trachea midline Cardiovascular: No clubbing, cyanosis, or edema. Respiratory: Normal respiratory effort, no increased work of breathing. Neurologic: Grossly intact, no focal deficits, moving all 4 extremities.  Bilateral pitting LE edema.  Psychiatric: Normal mood and affect.  Laboratory Data: Urinalysis Component     Latest Ref Rng & Units 06/10/2021  Specific Gravity, UA     1.005 - 1.030 1.020  pH, UA     5.0 - 7.5 7.0  Color, UA     Yellow Yellow  Appearance Ur     Clear Cloudy (A)  Leukocytes,UA     Negative 1+ (A)  Protein,UA     Negative/Trace Negative  Glucose, UA     Negative Negative  Ketones, UA     Negative Negative  RBC, UA     Negative Trace (A)  Bilirubin, UA      Negative Negative  Urobilinogen, Ur     0.2 - 1.0 mg/dL 0.2  Nitrite, UA     Negative Positive (A)  Microscopic Examination      See below:   Component     Latest Ref Rng & Units 06/10/2021  WBC, UA     0 - 5 /hpf >30 (A)  RBC     0 - 2 /hpf 0-2  Epithelial Cells (non renal)     0 - 10 /hpf 0-10  Bacteria, UA  None seen/Few Many (A)   I have reviewed the labs.   Pertinent Imaging: Results for WOODFORD, STREGE (MRN [id number]) as of 06/10/2021 09:03  Ref. Range 06/10/2021 09:03  Scan Result Unknown 66    Assessment & Plan:    1. Dysuria -UA nitrite positive, pyuria and many bacteria -Urine culture -will hold on prescribing antibiotics at this time unless he develops worsening UTI symptoms  2. Frequency -he is a difficult historian and has limited understanding of his medical issues.  He is also on multiple medications which may be contributing to his urinary issues.  I have advised him that the testosterone gel that he is applying may be contributing to his LE edema and the torsemide causes him to produce more urine which in turn causes him to void more frequently.   -suggest he have an appointment to discuss his medications and see if he may discontinue some of them-specifically the testosterone -discontinue the oxybutynin XL  Return in about 1 month (around 07/11/2021) for IPSS and PVR.  These notes generated with voice recognition software. I apologize for typographical errors.  Zara Council, PA-C  [facility] [address]  [place], [place] [address] [phone]

## 2021-06-10 ENCOUNTER — Ambulatory Visit (INDEPENDENT_AMBULATORY_CARE_PROVIDER_SITE_OTHER): Payer: Medicare Other | Admitting: Urology

## 2021-06-10 ENCOUNTER — Encounter: Payer: Self-pay | Admitting: Urology

## 2021-06-10 VITALS — BP 142/88 | HR 77 | Ht 72.0 in | Wt 256.0 lb

## 2021-06-10 DIAGNOSIS — R300 Dysuria: Secondary | ICD-10-CM | POA: Diagnosis not present

## 2021-06-10 DIAGNOSIS — R350 Frequency of micturition: Secondary | ICD-10-CM | POA: Diagnosis not present

## 2021-06-10 LAB — BLADDER SCAN AMB NON-IMAGING: Scan Result: 66

## 2021-06-10 LAB — MICROSCOPIC EXAMINATION: WBC, UA: >30 /hpf — AB (ref 0–5)

## 2021-06-10 LAB — URINALYSIS, COMPLETE
Bilirubin, UA: NEGATIVE
Glucose, UA: NEGATIVE
Ketones, UA: NEGATIVE
Nitrite, UA: POSITIVE — AB
Protein,UA: NEGATIVE
Specific Gravity, UA: 1.02 (ref 1.005–1.030)
Urobilinogen, Ur: 0.2 mg/dL (ref 0.2–1.0)
pH, UA: 7 (ref 5.0–7.5)

## 2021-06-10 NOTE — Patient Instructions (Signed)
STOP THE OXYBUTYNIN XL

## 2021-06-13 LAB — CULTURE, URINE COMPREHENSIVE

## 2021-06-16 ENCOUNTER — Other Ambulatory Visit: Payer: Self-pay

## 2021-06-16 DIAGNOSIS — I872 Venous insufficiency (chronic) (peripheral): Secondary | ICD-10-CM | POA: Diagnosis not present

## 2021-06-16 NOTE — Progress Notes (Signed)
JOSTEN, WARMUTH ([id number]) Visit Report for 06/16/2021 Arrival Information Details Patient Name: [name] Bean, [name] Bean Date of Service: 06/16/2021 9:00 AM Medical Record Number: [id number] Patient Account Number: [id number] Date of Birth/Sex: Jan 02, 1935 (86 y.o. M) Treating RN: Donnamarie Poag Primary Care Tanara Turvey: Harrel Lemon Other Clinician: Referring Deby Adger: Harrel Lemon Treating Kellina Dreese/Extender: Skipper Cliche in Treatment: 4 Visit Information History Since Last Visit Added or deleted any medications: No Patient Arrived: Ambulatory Had a fall or experienced change in No Arrival Time: 09:06 activities of daily living that may affect Accompanied By: self risk of falls: Transfer Assistance: None Hospitalized since last visit: No Patient Identification Verified: Yes Has Dressing in Place as Prescribed: Yes Secondary Verification Process Completed: Yes Has Compression in Place as Prescribed: Yes Patient Has Alerts: Yes Pain Present Now: Yes Patient Alerts: Patient on Blood Thinner Aspirin 81mg  Electronic Signature(s) Signed: 06/16/2021 9:22:34 AM By: Donnamarie Poag Entered By: Donnamarie Poag on 06/16/2021 09:09:36 [name] Bean ([id number]) -------------------------------------------------------------------------------- Clinic Level of Care Assessment Details Patient Name: [name] Bean Date of Service: 06/16/2021 9:00 AM Medical Record Number: [id number] Patient Account Number: [id number] Date of Birth/Sex: [birth year] (86 y.o. M) Treating RN: Donnamarie Poag Primary Care Oumar Marcott: Harrel Lemon Other Clinician: Referring Emillee Talsma: Harrel Lemon Treating Edee Nifong/Extender: Skipper Cliche in Treatment: 4 Clinic Level of Care Assessment Items TOOL 1 Quantity Score []  - Use when EandM and Procedure is performed on INITIAL visit 0 ASSESSMENTS - Nursing Assessment / Reassessment []  - General Physical Exam (combine w/ comprehensive assessment (listed just below) when performed on  new 0 pt. evals) []  - 0 Comprehensive Assessment (HX, ROS, Risk Assessments, Wounds Hx, etc.) ASSESSMENTS - Wound and Skin Assessment / Reassessment []  - Dermatologic / Skin Assessment (not related to wound area) 0 ASSESSMENTS - Ostomy and/or Continence Assessment and Care []  - Incontinence Assessment and Management 0 []  - 0 Ostomy Care Assessment and Management (repouching, etc.) PROCESS - Coordination of Care []  - Simple Patient / Family Education for ongoing care 0 []  - 0 Complex (extensive) Patient / Family Education for ongoing care []  - 0 Staff obtains Programmer, systems, Records, Test Results / Process Orders []  - 0 Staff telephones HHA, Nursing Homes / Clarify orders / etc []  - 0 Routine Transfer to another Facility (non-emergent condition) []  - 0 Routine Hospital Admission (non-emergent condition) []  - 0 New Admissions / Biomedical engineer / Ordering NPWT, Apligraf, etc. []  - 0 Emergency Hospital Admission (emergent condition) PROCESS - Special Needs []  - Pediatric / Minor Patient Management 0 []  - 0 Isolation Patient Management []  - 0 Hearing / Language / Visual special needs []  - 0 Assessment of Community assistance (transportation, D/C planning, etc.) []  - 0 Additional assistance / Altered mentation []  - 0 Support Surface(s) Assessment (bed, cushion, seat, etc.) INTERVENTIONS - Miscellaneous []  - External ear exam 0 []  - 0 Patient Transfer (multiple staff / Civil Service fast streamer / Similar devices) []  - 0 Simple Staple / Suture removal (25 or less) []  - 0 Complex Staple / Suture removal (26 or more) []  - 0 Hypo/Hyperglycemic Management (do not check if billed separately) []  - 0 Ankle / Brachial Index (ABI) - do not check if billed separately Has the patient been seen at the hospital within the last three years: Yes Total Score: 0 Level Of Care: ____ [name] Bean ([id number]) Electronic Signature(s) Signed: 06/16/2021 9:22:34 AM By: Donnamarie Poag Entered By: Donnamarie Poag on 06/16/2021 09:22:10 [name] Bean ([id number]) -------------------------------------------------------------------------------- Compression Therapy Details Patient Name: [name] Bean Date of Service: 06/16/2021 9:00  AM Medical Record Number: [id number] Patient Account Number: [id number] Date of Birth/Sex: 1935-08-14 (86 y.o. M) Treating RN: Donnamarie Poag Primary Care Nassim Cosma: Harrel Lemon Other Clinician: Referring Roselle Norton: Harrel Lemon Treating Atwood Adcock/Extender: Jeri Cos Weeks in Treatment: 4 Compression Therapy Performed for Wound Assessment: Wound #1 Left,Distal,Medial Lower Leg Performed By: Junius Argyle, RN Compression Type: Four Layer Electronic Signature(s) Signed: 06/16/2021 9:22:34 AM By: Donnamarie Poag Entered By: Donnamarie Poag on 06/16/2021 09:10:05 [name] Bean ([id number]) -------------------------------------------------------------------------------- Encounter Discharge Information Details Patient Name: [name] Bean Date of Service: 06/16/2021 9:00 AM Medical Record Number: [id number] Patient Account Number: [id number] Date of Birth/Sex: 26-Nov-1934 (86 y.o. M) Treating RN: Donnamarie Poag Primary Care Teona Vargus: Harrel Lemon Other Clinician: Referring Chin Wachter: Harrel Lemon Treating Thornton Dohrmann/Extender: Skipper Cliche in Treatment: 4 Encounter Discharge Information Items Discharge Condition: Stable Ambulatory Status: Cane Discharge Destination: Home Transportation: Private Auto Accompanied By: self Schedule Follow-up Appointment: Yes Clinical Summary of Care: Electronic Signature(s) Signed: 06/16/2021 9:22:34 AM By: Donnamarie Poag Entered By: Donnamarie Poag on 06/16/2021 09:22:05 [name] Bean ([id number]) -------------------------------------------------------------------------------- Wound Assessment Details Patient Name: [name] Bean Date of Service: 06/16/2021 9:00 AM Medical Record Number: [id number] Patient Account Number: [id number] Date of  Birth/Sex: 03/22/1935 (86 y.o. M) Treating RN: Donnamarie Poag Primary Care Cherylyn Sundby: Harrel Lemon Other Clinician: Referring Clarann Helvey: Harrel Lemon Treating Aamani Moose/Extender: Jeri Cos Weeks in Treatment: 4 Wound Status Wound Number: 1 Primary Trauma, Other Etiology: Wound Location: Left, Distal, Medial Lower Leg Secondary Lymphedema Wounding Event: Bump Etiology: Date Acquired: 05/03/2021 Wound Status: Open Weeks Of Treatment: 4 Comorbid Cataracts, Anemia, Asthma, Sleep Apnea, Coronary Clustered Wound: Yes History: Artery Disease, Osteoarthritis Wound Measurements Length: (cm) 3.6 Width: (cm) 2.7 Depth: (cm) 0.2 Area: (cm) 7.634 Volume: (cm) 1.527 % Reduction in Area: -1% % Reduction in Volume: 32.6% Wound Description Classification: Full Thickness Without Exposed Support Structu Exudate Amount: Medium Exudate Type: Serosanguineous Exudate Color: red, brown res Foul Odor After Cleansing: No Slough/Fibrino Yes Wound Bed Granulation Amount: Large (67-100%) Exposed Structure Granulation Quality: Red, Pink Fascia Exposed: No Necrotic Amount: Small (1-33%) Fat Layer (Subcutaneous Tissue) Exposed: Yes Necrotic Quality: Adherent Slough Tendon Exposed: No Muscle Exposed: No Joint Exposed: No Bone Exposed: No Treatment Notes Wound #1 (Lower Leg) Wound Laterality: Left, Medial, Distal Cleanser Soap and Water Discharge Instruction: Gently cleanse wound with antibacterial soap, rinse and pat dry prior to dressing wounds Peri-Wound Care Topical Primary Dressing Hydrofera Blue Ready Transfer Foam, 2.5x2.5 (in/in) Discharge Instruction: Apply Hydrofera Blue Ready to wound bed as directed Secondary Dressing ABD Pad 5x9 (in/in) Discharge Instruction: Cover with ABD pad Secured With Compression Wrap Medichoice 4 layer Compression System, 35-40 mmHG Gossett, Danzel ([id number]) Discharge Instruction: Apply multi-layer wrap as directed. Compression  Stockings Add-Ons Electronic Signature(s) Signed: 06/16/2021 9:22:34 AM By: Donnamarie Poag Entered ByDonnamarie Poag on 06/16/2021 09:09:53

## 2021-06-23 ENCOUNTER — Other Ambulatory Visit: Payer: Self-pay

## 2021-06-23 ENCOUNTER — Encounter: Payer: Medicare Other | Admitting: Physician Assistant

## 2021-06-23 DIAGNOSIS — I872 Venous insufficiency (chronic) (peripheral): Secondary | ICD-10-CM | POA: Diagnosis not present

## 2021-06-23 NOTE — Progress Notes (Addendum)
JUVENTINO, PAVONE ([id number]) Visit Report for 06/23/2021 Chief Complaint Document Details Patient Name: [name] Bean, [name] Bean Date of Service: 06/23/2021 10:45 AM Medical Record Number: [id number] Patient Account Number: [id number] Date of Birth/Sex: Nov 19, 1934 (86 y.o. M) Treating RN: Cornell Barman Primary Care Provider: Harrel Lemon Other Clinician: Referring Provider: Harrel Lemon Treating Provider/Extender: Skipper Cliche in Treatment: 5 Information Obtained from: Patient Chief Complaint Left LE Ulcer Electronic Signature(s) Signed: 06/23/2021 11:06:15 AM By: Worthy Keeler PA-C Entered By: Worthy Keeler on 06/23/2021 11:06:14 [facility], [place] ([id number]) -------------------------------------------------------------------------------- HPI Details Patient Name: [name] Bean Date of Service: 06/23/2021 10:45 AM Medical Record Number: [id number] Patient Account Number: [id number] Date of Birth/Sex: [birth year] (86 y.o. M) Treating RN: Cornell Barman Primary Care Provider: Harrel Lemon Other Clinician: Referring Provider: Harrel Lemon Treating Provider/Extender: Skipper Cliche in Treatment: 5 History of Present Illness HPI Description: 05/17/2021 upon evaluation today patient presents for initial evaluation here in the clinic concerning issues that he has been healing. This again is not terribly uncommon especially with the significant swelling that he is experiencing. He does not have the compression wraps or compression stockings he tells me has never been told he should wear those but he definitely should be wearing those. Fortunately there does not appear to be any evidence of active infection at this time which is great news systemically though locally I do see some evidence of maceration and there is some question as to whether or not honestly he may have some infection present. He definitely has some slough and biofilm that needs to be cleared away. Either way I think with appropriate  compression we can definitely get things moving in the right direction. Patient does have a history of chronic venous stasis, lymphedema, and his wounds unfortunately are right in the lower extremity on the left where he does have significant swelling and lymphedema. This means this can make things somewhat more difficult to heal in general. Other than this he also has a history of hypertension. 05/24/2021 upon evaluation today patient appears to be doing decently well in regard to his wound. He has been tolerating the dressing changes without complication. Fortunately there does not appear to be any evidence of active infection at this point which is great news and overall I am pleased in that regard. Nonetheless I do believe that the patient is still having quite a bit of discomfort at least when I cleaned the wound but overall I think we are headed in the appropriate direction. No fevers, chills, nausea, vomiting, or diarrhea. 06/09/2021 upon evaluation today patient appears to be doing well with regard to his wound. He has been tolerating the dressing changes without complication. Fortunately there does not appear to be any signs of active infection which is great news and overall very pleased with where we stand today. Nonetheless there is been made to be some sharp debridement performed today. Also think that the patient probably needs a stronger compression I would recommend that we increase to a 4-layer compression wrap. 06/23/2021 upon evaluation today patient appears to be doing better in regard to his leg ulcer. He has been tolerating the dressing changes without complication and overall I am extremely pleased with where we stand I think he is headed in the appropriate direction and this is healing quite nicely. Electronic Signature(s) Signed: 06/23/2021 5:35:27 PM By: Worthy Keeler PA-C Entered By: Worthy Keeler on 06/23/2021 17:35:27 [facility], [place]  ([id number]) -------------------------------------------------------------------------------- Physical Exam Details Patient Name: [name] Bean Date of Service: 06/23/2021  10:45 AM Medical Record Number: [id number] Patient Account Number: [id number] Date of Birth/Sex: November 26, 1934 (86 y.o. M) Treating RN: Cornell Barman Primary Care Provider: Harrel Lemon Other Clinician: Referring Provider: Harrel Lemon Treating Provider/Extender: Jeri Cos Weeks in Treatment: 5 Constitutional Well-nourished and well-hydrated in no acute distress. Respiratory normal breathing without difficulty. Psychiatric this patient is able to make decisions and demonstrates good insight into disease process. Alert and Oriented x 3. pleasant and cooperative. Notes Upon inspection patient's wound bed showed signs of good granulation and epithelization at this point. No sharp debridement was necessary and overall I think the patient is making excellent progress. I do not see any signs of infection systemically nor locally at this point. Electronic Signature(s) Signed: 06/23/2021 5:35:47 PM By: Worthy Keeler PA-C Entered By: Worthy Keeler on 06/23/2021 17:35:47 [facility], [place] ([id number]) -------------------------------------------------------------------------------- Physician Orders Details Patient Name: [name] Bean Date of Service: 06/23/2021 10:45 AM Medical Record Number: [id number] Patient Account Number: [id number] Date of Birth/Sex: 26-Jul-1935 (86 y.o. M) Treating RN: Cornell Barman Primary Care Provider: Harrel Lemon Other Clinician: Referring Provider: Harrel Lemon Treating Provider/Extender: Skipper Cliche in Treatment: 5 Verbal / Phone Orders: No Diagnosis Coding ICD-10 Coding Code Description I87.2 Venous insufficiency (chronic) (peripheral) I89.0 Lymphedema, not elsewhere classified L97.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 layer exposed [place]  (primary) hypertension Follow-up Appointments o Return Appointment in 1 week. Bathing/ Shower/ Hygiene o May shower with wound dressing protected with water repellent cover or cast protector. o No tub bath. Edema Control - Lymphedema / Segmental Compressive Device / Other o Patient to wear own compression stockings. Remove compression stockings every night before going to bed and put on every morning when getting up. - right leg o Elevate legs to the level of the heart and pump ankles as often as possible o Elevate leg(s) parallel to the floor when sitting. o DO YOUR BEST to sleep in the bed at night. DO NOT sleep in your recliner. Long hours of sitting in a recliner leads to swelling of the legs and/or potential wounds on your backside. Wound Treatment Wound #1 - Lower Leg Wound Laterality: Left, Medial, Distal Cleanser: Soap and Water 1 x Per Week/30 Days Discharge Instructions: Gently cleanse wound with antibacterial soap, rinse and pat dry prior to dressing wounds Primary Dressing: Prisma 4.34 (in) 1 x Per Week/30 Days Discharge Instructions: Moisten w/normal saline or sterile water; Cover wound as directed. Do not remove from wound bed. Secondary Dressing: Gauze 1 x Per Week/30 Days Discharge Instructions: As directed: dry, moistened with saline or moistened with Dakins Solution Compression Wrap: Medichoice 4 layer Compression System, 35-40 mmHG 1 x Per Week/30 Days Discharge Instructions: Apply multi-layer wrap as directed. Electronic Signature(s) Signed: 06/23/2021 2:59:00 PM By: Gretta Cool, BSN, RN, CWS, Kim RN, BSN Signed: 06/24/2021 4:28:58 PM By: Worthy Keeler PA-C Entered By: Gretta Cool BSN, RN, CWS, Kim on 06/23/2021 11:21:52 [name] Bean ([id number]) -------------------------------------------------------------------------------- Problem List Details Patient Name: [name] Bean Date of Service: 06/23/2021 10:45 AM Medical Record Number: [id number] Patient Account  Number: [id number] Date of Birth/Sex: 11/17/1934 (86 y.o. M) Treating RN: Cornell Barman Primary Care Provider: Harrel Lemon Other Clinician: Referring Provider: Harrel Lemon Treating Provider/Extender: Skipper Cliche in Treatment: 5 Active Problems ICD-10 Encounter Code Description Active Date MDM Diagnosis I87.2 Venous insufficiency (chronic) (peripheral) 05/17/2021 No Yes I89.0 Lymphedema, not elsewhere classified 05/17/2021 No Yes L97.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 layer 05/17/2021  No Yes exposed [place] (primary) hypertension 05/17/2021 No Yes Inactive Problems Resolved Problems Electronic Signature(s) Signed: 06/23/2021 11:06:06 AM By: Worthy Keeler PA-C Entered By: Worthy Keeler on 06/23/2021 11:06:06 [facility], [place] ([id number]) -------------------------------------------------------------------------------- Progress Note Details Patient Name: [name] Bean Date of Service: 06/23/2021 10:45 AM Medical Record Number: [id number] Patient Account Number: [id number] Date of Birth/Sex: [birth year] (86 y.o. M) Treating RN: Cornell Barman Primary Care Provider: Harrel Lemon Other Clinician: Referring Provider: Harrel Lemon Treating Provider/Extender: Skipper Cliche in Treatment: 5 Subjective Chief Complaint Information obtained from Patient Left LE Ulcer History of Present Illness (HPI) 05/17/2021 upon evaluation today patient presents for initial evaluation here in the clinic concerning issues that he has been healing. This again is not terribly uncommon especially with the significant swelling that he is experiencing. He does not have the compression wraps or compression stockings he tells me has never been told he should wear those but he definitely should be wearing those. Fortunately there does not appear to be any evidence of active infection at this time which is great news systemically though locally I do see some evidence of  maceration and there is some question as to whether or not honestly he may have some infection present. He definitely has some slough and biofilm that needs to be cleared away. Either way I think with appropriate compression we can definitely get things moving in the right direction. Patient does have a history of chronic venous stasis, lymphedema, and his wounds unfortunately are right in the lower extremity on the left where he does have significant swelling and lymphedema. This means this can make things somewhat more difficult to heal in general. Other than this he also has a history of hypertension. 05/24/2021 upon evaluation today patient appears to be doing decently well in regard to his wound. He has been tolerating the dressing changes without complication. Fortunately there does not appear to be any evidence of active infection at this point which is great news and overall I am pleased in that regard. Nonetheless I do believe that the patient is still having quite a bit of discomfort at least when I cleaned the wound but overall I think we are headed in the appropriate direction. No fevers, chills, nausea, vomiting, or diarrhea. 06/09/2021 upon evaluation today patient appears to be doing well with regard to his wound. He has been tolerating the dressing changes without complication. Fortunately there does not appear to be any signs of active infection which is great news and overall very pleased with where we stand today. Nonetheless there is been made to be some sharp debridement performed today. Also think that the patient probably needs a stronger compression I would recommend that we increase to a 4-layer compression wrap. 06/23/2021 upon evaluation today patient appears to be doing better in regard to his leg ulcer. He has been tolerating the dressing changes without complication and overall I am extremely pleased with where we stand I think he is headed in the appropriate direction and  this is healing quite nicely. Objective Constitutional Well-nourished and well-hydrated in no acute distress. Vitals Time Taken: 11:00 AM, Height: 73 in, Weight: 260 lbs, BMI: 34.3, Temperature: 98.7 F, Pulse: 78 bpm, Respiratory Rate: 16 breaths/min, Blood Pressure: 149/81 mmHg. Respiratory normal breathing without difficulty. Psychiatric this patient is able to make decisions and demonstrates good insight into disease process. Alert and Oriented x 3. pleasant and cooperative. General Notes: Upon inspection patient's wound bed showed signs of good granulation and epithelization  at this point. No sharp debridement was necessary and overall I think the patient is making excellent progress. I do not see any signs of infection systemically nor locally at this point. Integumentary (Hair, Skin) Wound #1 status is Open. Original cause of wound was Bump. The date acquired was: 05/03/2021. The wound has been in treatment 5 weeks. The wound is located on the Left,Distal,Medial Lower Leg. The wound measures 3cm length x 2.5cm width x 0.2cm depth; 5.89cm^2 area and 1.178cm^3 volume. There is Fat Layer (Subcutaneous Tissue) exposed. There is no tunneling or undermining noted. There is a medium amount of serosanguineous drainage noted. There is large (67-100%) red, pink granulation within the wound bed. There is a small (1-33%) amount of [facility], [place] ([id number]) necrotic tissue within the wound bed including Adherent Slough. Assessment Active Problems ICD-10 Venous insufficiency (chronic) (peripheral) Lymphedema, not elsewhere classified Non-pressure chronic ulcer of other part of left lower leg with fat layer exposed [place] (primary) hypertension Procedures Wound #1 Pre-procedure diagnosis of Wound #1 is a Trauma, Other located on the Left,Distal,Medial Lower Leg . There was a Four Layer Compression Therapy Procedure with a pre-treatment ABI of 1 by Cornell Barman, RN. Post procedure Diagnosis  Wound #1: Same as Pre-Procedure Plan Follow-up Appointments: Return Appointment in 1 week. Bathing/ Shower/ Hygiene: May shower with wound dressing protected with water repellent cover or cast protector. No tub bath. Edema Control - Lymphedema / Segmental Compressive Device / Other: Patient to wear own compression stockings. Remove compression stockings every night before going to bed and put on every morning when getting up. - right leg Elevate legs to the level of the heart and pump ankles as often as possible Elevate leg(s) parallel to the floor when sitting. DO YOUR BEST to sleep in the bed at night. DO NOT sleep in your recliner. Long hours of sitting in a recliner leads to swelling of the legs and/or potential wounds on your backside. WOUND #1: - Lower Leg Wound Laterality: Left, Medial, Distal Cleanser: Soap and Water 1 x Per Week/30 Days Discharge Instructions: Gently cleanse wound with antibacterial soap, rinse and pat dry prior to dressing wounds Primary Dressing: Prisma 4.34 (in) 1 x Per Week/30 Days Discharge Instructions: Moisten w/normal saline or sterile water; Cover wound as directed. Do not remove from wound bed. Secondary Dressing: Gauze 1 x Per Week/30 Days Discharge Instructions: As directed: dry, moistened with saline or moistened with Dakins Solution Compression Wrap: Medichoice 4 layer Compression System, 35-40 mmHG 1 x Per Week/30 Days Discharge Instructions: Apply multi-layer wrap as directed. 1. Would recommend that we going to continue with the wound care measures as before and the patient is in agreement with the plan. This includes the use of the silver collagen dressing which I think hopefully will do a good job that is a switch away from the [facility] but I am hopeful this will be better and hopefully allow more epithelial growth in general. 2. I am also can recommend that we have the patient continue with the 4-layer compression wrap which I think is  doing awesome job. We will see patient back for reevaluation in 1 week here in the clinic. If anything worsens or changes patient will contact our office for additional recommendations. Electronic Signature(s) Signed: 06/23/2021 5:36:19 PM By: Worthy Keeler PA-C Entered By: Worthy Keeler on 06/23/2021 17:36:18 [facility], [place] ([id number]) -------------------------------------------------------------------------------- SuperBill Details Patient Name: [name] Bean Date of Service: 06/23/2021 Medical Record Number: [id number] Patient Account Number: [id number] Date of  Birth/Sex: 1935/08/01 (86 y.o. M) Treating RN: Cornell Barman Primary Care Provider: Harrel Lemon Other Clinician: Referring Provider: Harrel Lemon Treating Provider/Extender: Skipper Cliche in Treatment: 5 Diagnosis Coding ICD-10 Codes Code Description I87.2 Venous insufficiency (chronic) (peripheral) I89.0 Lymphedema, not elsewhere classified L97.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 layer exposed [place] (primary) hypertension Facility Procedures CPT4 Code: [id number] Description: (Facility Use Only) [phone] - [place] LWR LT LEG Modifier: Quantity: 1 Physician Procedures CPT4 Code: [id number] Description: 50388 - WC PHYS LEVEL 3 - EST PT Modifier: Quantity: 1 CPT4 Code: Description: ICD-10 Diagnosis Description I87.2 Venous insufficiency (chronic) (peripheral) I89.0 Lymphedema, not elsewhere classified L97.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 lay I10 Essential (primary) hypertension Modifier: er exposed Quantity: Electronic Signature(s) Signed: 06/23/2021 5:39:02 PM By: Worthy Keeler PA-C Previous Signature: 06/23/2021 2:59:00 PM Version By: Gretta Cool, BSN, RN, CWS, Kim RN, BSN Entered By: Worthy Keeler on 06/23/2021 17:39:02

## 2021-06-23 NOTE — Progress Notes (Signed)
[name] Bean, [name] Bean ([id number]) Visit Report for 06/23/2021 Arrival Information Details Patient Name: FYNN, VANBLARCOM Date of Service: 06/23/2021 10:45 AM Medical Record Number: [id number] Patient Account Number: [id number] Date of Birth/Sex: [birth year] (86 y.o. M) Treating RN: Cornell Barman Primary Care Arlin Savona: Harrel Lemon Other Clinician: Referring Jonnie Truxillo: Harrel Lemon Treating Shiah Berhow/Extender: Skipper Cliche in Treatment: 5 Visit Information History Since Last Visit Added or deleted any medications: No Patient Arrived: Cane Has Compression in Place as Prescribed: Yes Arrival Time: 10:59 Pain Present Now: No Accompanied By: self Transfer Assistance: None Patient Identification Verified: Yes Secondary Verification Process Completed: Yes Patient Has Alerts: Yes Patient Alerts: Patient on Blood Thinner Aspirin 81mg  Electronic Signature(s) Signed: 06/23/2021 2:59:00 PM By: Gretta Cool, BSN, RN, CWS, Kim RN, BSN Entered By: Gretta Cool, BSN, RN, CWS, Kim on 06/23/2021 10:59:40 [name] Bean ([id number]) -------------------------------------------------------------------------------- Compression Therapy Details Patient Name: [name] Bean Date of Service: 06/23/2021 10:45 AM Medical Record Number: [id number] Patient Account Number: [id number] Date of Birth/Sex: [birth year] (86 y.o. M) Treating RN: Cornell Barman Primary Care Maxcine Strong: Harrel Lemon Other Clinician: Referring Aretta Stetzel: Harrel Lemon Treating Laparis Durrett/Extender: Skipper Cliche in Treatment: 5 Compression Therapy Performed for Wound Assessment: Wound #1 Left,Distal,Medial Lower Leg Performed By: Clinician Cornell Barman, RN Compression Type: Four Layer Pre Treatment ABI: 1 Post Procedure Diagnosis Same as Pre-procedure Electronic Signature(s) Signed: 06/23/2021 2:59:00 PM By: Gretta Cool, BSN, RN, CWS, Kim RN, BSN Entered By: Gretta Cool, BSN, RN, CWS, Kim on 06/23/2021 11:20:00 [name] Bean  ([id number]) -------------------------------------------------------------------------------- Encounter Discharge Information Details Patient Name: [name] Bean Date of Service: 06/23/2021 10:45 AM Medical Record Number: [id number] Patient Account Number: [id number] Date of Birth/Sex: [birth year] (86 y.o. M) Treating RN: Cornell Barman Primary Care Madilyn Cephas: Harrel Lemon Other Clinician: Referring Klea Nall: Harrel Lemon Treating Dequann Vandervelden/Extender: Skipper Cliche in Treatment: 5 Encounter Discharge Information Items Discharge Condition: Stable Ambulatory Status: Ambulatory Discharge Destination: Home Transportation: Private Auto Accompanied By: self Schedule Follow-up Appointment: Yes Clinical Summary of Care: Electronic Signature(s) Signed: 06/23/2021 2:59:00 PM By: Gretta Cool, BSN, RN, CWS, Kim RN, BSN Entered By: Gretta Cool, BSN, RN, CWS, Kim on 06/23/2021 11:39:14 [name] Bean ([id number]) -------------------------------------------------------------------------------- Lower Extremity Assessment Details Patient Name: [name] Bean Date of Service: 06/23/2021 10:45 AM Medical Record Number: [id number] Patient Account Number: [id number] Date of Birth/Sex: Nov 19, 1934 (86 y.o. M) Treating RN: Cornell Barman Primary Care Emelio Schneller: Harrel Lemon Other Clinician: Referring Desyre Calma: Harrel Lemon Treating Vedansh Kerstetter/Extender: Jeri Cos Weeks in Treatment: 5 Edema Assessment Assessed: [Left: Yes] [Right: No] Edema: [Left: Ye] [Right: s] Calf Left: Right: Point of Measurement: 40 cm From Medial Instep 44 cm Ankle Left: Right: Point of Measurement: 14 cm From Medial Instep 28 cm Vascular Assessment Pulses: Dorsalis Pedis Palpable: [Left:Yes] Electronic Signature(s) Signed: 06/23/2021 2:59:00 PM By: Gretta Cool, BSN, RN, CWS, Kim RN, BSN Entered By: Gretta Cool, BSN, RN, CWS, Kim on 06/23/2021 11:10:46 [name] Bean  ([id number]) -------------------------------------------------------------------------------- Multi Wound Chart Details Patient Name: [name] Bean Date of Service: 06/23/2021 10:45 AM Medical Record Number: [id number] Patient Account Number: [id number] Date of Birth/Sex: [birth year] (86 y.o. M) Treating RN: Cornell Barman Primary Care Tarry Blayney: Harrel Lemon Other Clinician: Referring Allice Garro: Harrel Lemon Treating Taytum Scheck/Extender: Skipper Cliche in Treatment: 5 Vital Signs Height(in): 73 Pulse(bpm): 68 Weight(lbs): 260 Blood Pressure(mmHg): 149/81 Body Mass Index(BMI): 34 Temperature(F): 98.7 Respiratory Rate(breaths/min): 16 Photos: [N/A:N/A] Wound Location: Left, Distal, Medial Lower Leg N/A N/A Wounding Event: Bump N/A N/A Primary Etiology: Trauma, Other N/A N/A Secondary Etiology: Lymphedema N/A N/A Comorbid History: Cataracts, Anemia, Asthma, Sleep N/A N/A Apnea, Coronary Artery Disease, Osteoarthritis Date Acquired:  05/03/2021 N/A N/A Weeks of Treatment: 5 N/A N/A Wound Status: Open N/A N/A Clustered Wound: Yes N/A N/A Measurements L x W x D (cm) 3x2.5x0.2 N/A N/A Area (cm) : 5.89 N/A N/A Volume (cm) : 1.178 N/A N/A % Reduction in Area: 22.00% N/A N/A % Reduction in Volume: 48.00% N/A N/A Classification: Full Thickness Without Exposed N/A N/A Support Structures Exudate Amount: Medium N/A N/A Exudate Type: Serosanguineous N/A N/A Exudate Color: red, brown N/A N/A Granulation Amount: Large (67-100%) N/A N/A Granulation Quality: Red, Pink N/A N/A Necrotic Amount: Small (1-33%) N/A N/A Exposed Structures: Fat Layer (Subcutaneous Tissue): N/A N/A Yes Fascia: No Tendon: No Muscle: No Joint: No Bone: No Epithelialization: Small (1-33%) N/A N/A Treatment Notes Electronic Signature(s) Signed: 06/23/2021 2:59:00 PM By: Gretta Cool, BSN, RN, CWS, Kim RN, BSN Entered By: Gretta Cool, BSN, RN, CWS, Kim on 06/23/2021 11:10:59 [place], [place] ([id number]) [name] Bean  ([id number]) -------------------------------------------------------------------------------- Multi-Disciplinary Care Plan Details Patient Name: [name] Bean Date of Service: 06/23/2021 10:45 AM Medical Record Number: [id number] Patient Account Number: [id number] Date of Birth/Sex: 1934/09/25 (86 y.o. M) Treating RN: Cornell Barman Primary Care Sorrel Cassetta: Harrel Lemon Other Clinician: Referring Tanishia Lemaster: Harrel Lemon Treating Nashiya Disbrow/Extender: Skipper Cliche in Treatment: 5 Active Inactive Wound/Skin Impairment Nursing Diagnoses: Impaired tissue integrity Knowledge deficit related to smoking impact on wound healing Knowledge deficit related to ulceration/compromised skin integrity Goals: Ulcer/skin breakdown will have a volume reduction of 30% by week 4 Date Initiated: 05/17/2021 Target Resolution Date: 06/16/2021 Goal Status: Active Ulcer/skin breakdown will have a volume reduction of 50% by week 8 Date Initiated: 05/17/2021 Target Resolution Date: 07/17/2021 Goal Status: Active Ulcer/skin breakdown will have a volume reduction of 80% by week 12 Date Initiated: 05/17/2021 Target Resolution Date: 08/16/2021 Goal Status: Active Ulcer/skin breakdown will heal within 14 weeks Date Initiated: 05/17/2021 Target Resolution Date: 08/30/2021 Goal Status: Active Interventions: Assess patient/caregiver ability to obtain necessary supplies Assess patient/caregiver ability to perform ulcer/skin care regimen upon admission and as needed Assess ulceration(s) every visit Notes: Electronic Signature(s) Signed: 06/23/2021 2:59:00 PM By: Gretta Cool, BSN, RN, CWS, Kim RN, BSN Entered By: Gretta Cool, BSN, RN, CWS, Kim on 06/23/2021 11:10:52 [name] Bean ([id number]) -------------------------------------------------------------------------------- Pain Assessment Details Patient Name: [name] Bean Date of Service: 06/23/2021 10:45 AM Medical Record Number: [id number] Patient Account Number:  [id number] Date of Birth/Sex: [birth year] (86 y.o. M) Treating RN: Cornell Barman Primary Care Mykiah Schmuck: Harrel Lemon Other Clinician: Referring Narek Kniss: Harrel Lemon Treating Brad Mcgaughy/Extender: Skipper Cliche in Treatment: 5 Active Problems Location of Pain Severity and Description of Pain Patient Has Paino Yes Site Locations Pain Management and Medication Current Pain Management: Notes Patient states he hurts all over and everything is wrong with him. [place] area on leg does not hurt at this time. Electronic Signature(s) Signed: 06/23/2021 2:59:00 PM By: Gretta Cool, BSN, RN, CWS, Kim RN, BSN Entered By: Gretta Cool, BSN, RN, CWS, Kim on 06/23/2021 11:01:46 [name] Bean ([id number]) -------------------------------------------------------------------------------- Patient/Caregiver Education Details Patient Name: [name] Bean Date of Service: 06/23/2021 10:45 AM Medical Record Number: [id number] Patient Account Number: [id number] Date of Birth/Gender: [birth year] (86 y.o. M) Treating RN: Cornell Barman Primary Care Physician: Harrel Lemon Other Clinician: Referring Physician: Harrel Lemon Treating Physician/Extender: Skipper Cliche in Treatment: 5 Education Assessment Education Provided To: Patient Education Topics Provided Venous: Handouts: Controlling Swelling with Multilayered Compression Wraps Methods: Demonstration, Explain/Verbal Responses: State content correctly Wound/Skin Impairment: Handouts: Caring for Your Ulcer Methods: Demonstration, Explain/Verbal Responses: State content correctly Electronic Signature(s) Signed: 06/23/2021 2:59:00 PM By: Gretta Cool, BSN, RN, CWS, Kim RN, BSN  Entered By: Gretta Cool, BSN, RN, CWS, Kim on 06/23/2021 11:37:25 [place], [place] ([id number]) -------------------------------------------------------------------------------- Wound Assessment Details Patient Name: [name] Bean, [name] Bean Date of Service: 06/23/2021 10:45 AM Medical Record Number: [id number] Patient  Account Number: [id number] Date of Birth/Sex: November 13, 1934 (86 y.o. M) Treating RN: Cornell Barman Primary Care Myeshia Fojtik: Harrel Lemon Other Clinician: Referring Kebron Pulse: Harrel Lemon Treating Morgan Keinath/Extender: Jeri Cos Weeks in Treatment: 5 Wound Status Wound Number: 1 Primary Trauma, Other Etiology: Wound Location: Left, Distal, Medial Lower Leg Secondary Lymphedema Wounding Event: Bump Etiology: Date Acquired: 05/03/2021 Wound Status: Open Weeks Of Treatment: 5 Comorbid Cataracts, Anemia, Asthma, Sleep Apnea, Coronary Clustered Wound: Yes History: Artery Disease, Osteoarthritis Photos Wound Measurements Length: (cm) 3 Width: (cm) 2.5 Depth: (cm) 0.2 Area: (cm) 5.89 Volume: (cm) 1.178 % Reduction in Area: 22% % Reduction in Volume: 48% Epithelialization: Small (1-33%) Tunneling: No Undermining: No Wound Description Classification: Full Thickness Without Exposed Support Structu Exudate Amount: Medium Exudate Type: Serosanguineous Exudate Color: red, brown res Foul Odor After Cleansing: No Slough/Fibrino Yes Wound Bed Granulation Amount: Large (67-100%) Exposed Structure Granulation Quality: Red, Pink Fascia Exposed: No Necrotic Amount: Small (1-33%) Fat Layer (Subcutaneous Tissue) Exposed: Yes Necrotic Quality: Adherent Slough Tendon Exposed: No Muscle Exposed: No Joint Exposed: No Bone Exposed: No Treatment Notes Wound #1 (Lower Leg) Wound Laterality: Left, Medial, Distal Cleanser Soap and Water Discharge Instruction: Gently cleanse wound with antibacterial soap, rinse and pat dry prior to dressing wounds Peri-Wound Care [facility], [name] ([id number]) Topical Primary Dressing Prisma 4.34 (in) Discharge Instruction: Moisten w/normal saline or sterile water; Cover wound as directed. Do not remove from wound bed. Secondary Dressing Gauze Discharge Instruction: As directed: dry, moistened with saline or moistened with Dakins Solution Secured With Compression  Wrap Medichoice 4 layer Compression System, 35-40 mmHG Discharge Instruction: Apply multi-layer wrap as directed. Compression Stockings Environmental education officer) Signed: 06/23/2021 2:59:00 PM By: Gretta Cool, BSN, RN, CWS, Kim RN, BSN Entered By: Gretta Cool, BSN, RN, CWS, Kim on 06/23/2021 11:08:32 [name] Bean ([id number]) -------------------------------------------------------------------------------- Vitals Details Patient Name: [name] Bean Date of Service: 06/23/2021 10:45 AM Medical Record Number: [id number] Patient Account Number: [id number] Date of Birth/Sex: [birth year] (86 y.o. M) Treating RN: Cornell Barman Primary Care Jamie Belger: Harrel Lemon Other Clinician: Referring Kindsey Eblin: Harrel Lemon Treating Pranay Hilbun/Extender: Skipper Cliche in Treatment: 5 Vital Signs Time Taken: 11:00 Temperature (F): 98.7 Height (in): 73 Pulse (bpm): 78 Weight (lbs): 260 Respiratory Rate (breaths/min): 16 Body Mass Index (BMI): 34.3 Blood Pressure (mmHg): 149/81 Reference Range: 80 - 120 mg / dl Electronic Signature(s) Signed: 06/23/2021 2:59:00 PM By: Gretta Cool, BSN, RN, CWS, Kim RN, BSN Entered By: Gretta Cool, BSN, RN, CWS, Kim on 06/23/2021 11:00:54

## 2021-06-30 ENCOUNTER — Encounter: Payer: Medicare Other | Admitting: Physician Assistant

## 2021-06-30 ENCOUNTER — Other Ambulatory Visit: Payer: Self-pay

## 2021-06-30 DIAGNOSIS — I872 Venous insufficiency (chronic) (peripheral): Secondary | ICD-10-CM | POA: Diagnosis not present

## 2021-06-30 NOTE — Progress Notes (Addendum)
VANNAK, MONTENEGRO ([id number]) Visit Report for 06/30/2021 Chief Complaint Document Details Patient Name: TEEGAN, BRANDIS Date of Service: 06/30/2021 9:30 AM Medical Record Number: [id number] Patient Account Number: [id number] Date of Birth/Sex: [birth year] (86 y.o. M) Treating RN: Donnamarie Poag Primary Care Provider: Harrel Lemon Other Clinician: Referring Provider: Harrel Lemon Treating Provider/Extender: Skipper Cliche in Treatment: 6 Information Obtained from: Patient Chief Complaint Left LE Ulcer Electronic Signature(s) Signed: 06/30/2021 9:42:30 AM By: Worthy Keeler PA-C Entered By: Worthy Keeler on 06/30/2021 09:42:30 [facility], [place] ([id number]) -------------------------------------------------------------------------------- HPI Details Patient Name: [name] Bean Date of Service: 06/30/2021 9:30 AM Medical Record Number: [id number] Patient Account Number: [id number] Date of Birth/Sex: 1935-07-24 (86 y.o. M) Treating RN: Donnamarie Poag Primary Care Provider: Harrel Lemon Other Clinician: Referring Provider: Harrel Lemon Treating Provider/Extender: Skipper Cliche in Treatment: 6 History of Present Illness HPI Description: 05/17/2021 upon evaluation today patient presents for initial evaluation here in the clinic concerning issues that he has been healing. This again is not terribly uncommon especially with the significant swelling that he is experiencing. He does not have the compression wraps or compression stockings he tells me has never been told he should wear those but he definitely should be wearing those. Fortunately there does not appear to be any evidence of active infection at this time which is great news systemically though locally I do see some evidence of maceration and there is some question as to whether or not honestly he may have some infection present. He definitely has some slough and biofilm that needs to be cleared away. Either way I think with appropriate  compression we can definitely get things moving in the right direction. Patient does have a history of chronic venous stasis, lymphedema, and his wounds unfortunately are right in the lower extremity on the left where he does have significant swelling and lymphedema. This means this can make things somewhat more difficult to heal in general. Other than this he also has a history of hypertension. 05/24/2021 upon evaluation today patient appears to be doing decently well in regard to his wound. He has been tolerating the dressing changes without complication. Fortunately there does not appear to be any evidence of active infection at this point which is great news and overall I am pleased in that regard. Nonetheless I do believe that the patient is still having quite a bit of discomfort at least when I cleaned the wound but overall I think we are headed in the appropriate direction. No fevers, chills, nausea, vomiting, or diarrhea. 06/09/2021 upon evaluation today patient appears to be doing well with regard to his wound. He has been tolerating the dressing changes without complication. Fortunately there does not appear to be any signs of active infection which is great news and overall very pleased with where we stand today. Nonetheless there is been made to be some sharp debridement performed today. Also think that the patient probably needs a stronger compression I would recommend that we increase to a 4-layer compression wrap. 06/23/2021 upon evaluation today patient appears to be doing better in regard to his leg ulcer. He has been tolerating the dressing changes without complication and overall I am extremely pleased with where we stand I think he is headed in the appropriate direction and this is healing quite nicely. 06/30/2021 upon evaluation today patient appears to be doing well with regard to his wound. In fact this is measuring significantly smaller and overall seems to be doing great. I am  very pleased I  am hopeful to get this done soon and he will be out of here. He is definitely ready to get out of the Wrap as this is causing a lot of problems with his knee. Electronic Signature(s) Signed: 06/30/2021 10:21:26 AM By: Worthy Keeler PA-C Entered By: Worthy Keeler on 06/30/2021 10:21:25 [name] Bean ([id number]) -------------------------------------------------------------------------------- Physical Exam Details Patient Name: [name] Bean Date of Service: 06/30/2021 9:30 AM Medical Record Number: [id number] Patient Account Number: [id number] Date of Birth/Sex: 1935-03-15 (86 y.o. M) Treating RN: Donnamarie Poag Primary Care Provider: Harrel Lemon Other Clinician: Referring Provider: Harrel Lemon Treating Provider/Extender: Jeri Cos Weeks in Treatment: 6 Constitutional Well-nourished and well-hydrated in no acute distress. Respiratory normal breathing without difficulty. Psychiatric this patient is able to make decisions and demonstrates good insight into disease process. Alert and Oriented x 3. pleasant and cooperative. Notes Upon inspection patient's wound bed showed signs of good granulation epithelization at this point. Fortunately there does not appear to be any signs of active infection. Overall I think that he is doing quite well especially in regard to the swelling in his leg although obviously this makes the swelling in his knee a little bit worse. Electronic Signature(s) Signed: 06/30/2021 10:22:26 AM By: Worthy Keeler PA-C Entered By: Worthy Keeler on 06/30/2021 10:22:25 [name] Bean ([id number]) -------------------------------------------------------------------------------- Physician Orders Details Patient Name: [name] Bean Date of Service: 06/30/2021 9:30 AM Medical Record Number: [id number] Patient Account Number: [id number] Date of Birth/Sex: 04-14-1935 (86 y.o. M) Treating RN: Donnamarie Poag Primary Care Provider: Harrel Lemon Other  Clinician: Referring Provider: Harrel Lemon Treating Provider/Extender: Skipper Cliche in Treatment: 6 Verbal / Phone Orders: No Diagnosis Coding ICD-10 Coding Code Description I87.2 Venous insufficiency (chronic) (peripheral) I89.0 Lymphedema, not elsewhere classified L97.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 layer exposed [place] (primary) hypertension Follow-up Appointments o Return Appointment in 1 week. o Nurse Visit as needed Bathing/ Shower/ Hygiene o May shower with wound dressing protected with water repellent cover or cast protector. o No tub bath. Edema Control - Lymphedema / Segmental Compressive Device / Other o Optional: One layer of unna paste to top of compression wrap (to act as an anchor). o Patient to wear own compression stockings. Remove compression stockings every night before going to bed and put on every morning when getting up. - right leg o Elevate legs to the level of the heart and pump ankles as often as possible o Elevate leg(s) parallel to the floor when sitting. o DO YOUR BEST to sleep in the bed at night. DO NOT sleep in your recliner. Long hours of sitting in a recliner leads to swelling of the legs and/or potential wounds on your backside. Additional Orders / Instructions o Follow Nutritious Diet and Increase Protein Intake Wound Treatment Wound #1 - Lower Leg Wound Laterality: Left, Medial, Distal Cleanser: Soap and Water 1 x Per Week/30 Days Discharge Instructions: Gently cleanse wound with antibacterial soap, rinse and pat dry prior to dressing wounds Topical: thera derm moisturizing lotion to peri wound lower leg 1 x Per Week/30 Days Primary Dressing: Prisma 4.34 (in) 1 x Per Week/30 Days Discharge Instructions: Moisten w/normal saline or sterile water; Cover wound as directed. Do not remove from wound bed. Secondary Dressing: Gauze 1 x Per Week/30 Days Discharge Instructions: As  directed: dry, moistened with saline or moistened with Dakins Solution Compression Wrap: Medichoice 4 layer Compression System, 35-40 mmHG 1 x Per Week/30 Days Discharge Instructions: Apply multi-layer wrap  as directed. Electronic Signature(s) Signed: 06/30/2021 3:14:11 PM By: Donnamarie Poag Signed: 06/30/2021 4:43:43 PM By: Worthy Keeler PA-C Entered By: Donnamarie Poag on 06/30/2021 10:15:52 [facility] [name] ([id number]) -------------------------------------------------------------------------------- Problem List Details Patient Name: [name] Bean Date of Service: 06/30/2021 9:30 AM Medical Record Number: [id number] Patient Account Number: [id number] Date of Birth/Sex: 09-24-1934 (86 y.o. M) Treating RN: Donnamarie Poag Primary Care Provider: Harrel Lemon Other Clinician: Referring Provider: Harrel Lemon Treating Provider/Extender: Skipper Cliche in Treatment: 6 Active Problems ICD-10 Encounter Code Description Active Date MDM Diagnosis I87.2 Venous insufficiency (chronic) (peripheral) 05/17/2021 No Yes I89.0 Lymphedema, not elsewhere classified 05/17/2021 No Yes L97.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 layer 05/17/2021 No Yes exposed [place] (primary) hypertension 05/17/2021 No Yes Inactive Problems Resolved Problems Electronic Signature(s) Signed: 06/30/2021 9:42:22 AM By: Worthy Keeler PA-C Entered By: Worthy Keeler on 06/30/2021 09:42:22 [facility], [place] ([id number]) -------------------------------------------------------------------------------- Progress Note Details Patient Name: [name] Bean Date of Service: 06/30/2021 9:30 AM Medical Record Number: [id number] Patient Account Number: [id number] Date of Birth/Sex: Jun 26, 1935 (86 y.o. M) Treating RN: Donnamarie Poag Primary Care Provider: Harrel Lemon Other Clinician: Referring Provider: Harrel Lemon Treating Provider/Extender: Skipper Cliche in Treatment: 6 Subjective Chief  Complaint Information obtained from Patient Left LE Ulcer History of Present Illness (HPI) 05/17/2021 upon evaluation today patient presents for initial evaluation here in the clinic concerning issues that he has been healing. This again is not terribly uncommon especially with the significant swelling that he is experiencing. He does not have the compression wraps or compression stockings he tells me has never been told he should wear those but he definitely should be wearing those. Fortunately there does not appear to be any evidence of active infection at this time which is great news systemically though locally I do see some evidence of maceration and there is some question as to whether or not honestly he may have some infection present. He definitely has some slough and biofilm that needs to be cleared away. Either way I think with appropriate compression we can definitely get things moving in the right direction. Patient does have a history of chronic venous stasis, lymphedema, and his wounds unfortunately are right in the lower extremity on the left where he does have significant swelling and lymphedema. This means this can make things somewhat more difficult to heal in general. Other than this he also has a history of hypertension. 05/24/2021 upon evaluation today patient appears to be doing decently well in regard to his wound. He has been tolerating the dressing changes without complication. Fortunately there does not appear to be any evidence of active infection at this point which is great news and overall I am pleased in that regard. Nonetheless I do believe that the patient is still having quite a bit of discomfort at least when I cleaned the wound but overall I think we are headed in the appropriate direction. No fevers, chills, nausea, vomiting, or diarrhea. 06/09/2021 upon evaluation today patient appears to be doing well with regard to his wound. He has been tolerating the dressing  changes without complication. Fortunately there does not appear to be any signs of active infection which is great news and overall very pleased with where we stand today. Nonetheless there is been made to be some sharp debridement performed today. Also think that the patient probably needs a stronger compression I would recommend that we increase to a 4-layer compression wrap. 06/23/2021 upon evaluation today patient  appears to be doing better in regard to his leg ulcer. He has been tolerating the dressing changes without complication and overall I am extremely pleased with where we stand I think he is headed in the appropriate direction and this is healing quite nicely. 06/30/2021 upon evaluation today patient appears to be doing well with regard to his wound. In fact this is measuring significantly smaller and overall seems to be doing great. I am very pleased I am hopeful to get this done soon and he will be out of here. He is definitely ready to get out of the Wrap as this is causing a lot of problems with his knee. Objective Constitutional Well-nourished and well-hydrated in no acute distress. Vitals Time Taken: 9:45 AM, Height: 73 in, Weight: 260 lbs, BMI: 34.3, Temperature: 97.9 F, Pulse: 85 bpm, Respiratory Rate: 20 breaths/min, Blood Pressure: 110/69 mmHg. Respiratory normal breathing without difficulty. Psychiatric this patient is able to make decisions and demonstrates good insight into disease process. Alert and Oriented x 3. pleasant and cooperative. General Notes: Upon inspection patient's wound bed showed signs of good granulation epithelization at this point. Fortunately there does not appear to be any signs of active infection. Overall I think that he is doing quite well especially in regard to the swelling in his leg although obviously this makes the swelling in his knee a little bit worse. Integumentary (Hair, Skin) Kerstein, [name] ([id number]) Wound #1 status is Open.  Original cause of wound was Bump. The date acquired was: 05/03/2021. The wound has been in treatment 6 weeks. The wound is located on the Left,Distal,Medial Lower Leg. The wound measures 2.3cm length x 1.8cm width x 0.1cm depth; 3.252cm^2 area and 0.325cm^3 volume. There is Fat Layer (Subcutaneous Tissue) exposed. There is no tunneling or undermining noted. There is a medium amount of serosanguineous drainage noted. There is large (67-100%) red, pink granulation within the wound bed. There is a small (1-33%) amount of necrotic tissue within the wound bed including Adherent Slough. Assessment Active Problems ICD-10 Venous insufficiency (chronic) (peripheral) Lymphedema, not elsewhere classified Non-pressure chronic ulcer of other part of left lower leg with fat layer exposed [place] (primary) hypertension Procedures Wound #1 Pre-procedure diagnosis of Wound #1 is a Trauma, Other located on the Left,Distal,Medial Lower Leg . There was a Four Layer Compression Therapy Procedure by Donnamarie Poag, RN. Post procedure Diagnosis Wound #1: Same as Pre-Procedure Plan Follow-up Appointments: Return Appointment in 1 week. Nurse Visit as needed Bathing/ Shower/ Hygiene: May shower with wound dressing protected with water repellent cover or cast protector. No tub bath. Edema Control - Lymphedema / Segmental Compressive Device / Other: Optional: One layer of unna paste to top of compression wrap (to act as an anchor). Patient to wear own compression stockings. Remove compression stockings every night before going to bed and put on every morning when getting up. - right leg Elevate legs to the level of the heart and pump ankles as often as possible Elevate leg(s) parallel to the floor when sitting. DO YOUR BEST to sleep in the bed at night. DO NOT sleep in your recliner. Long hours of sitting in a recliner leads to swelling of the legs and/or potential wounds on your backside. Additional Orders /  Instructions: Follow Nutritious Diet and Increase Protein Intake WOUND #1: - Lower Leg Wound Laterality: Left, Medial, Distal Cleanser: Soap and Water 1 x Per Week/30 Days Discharge Instructions: Gently cleanse wound with antibacterial soap, rinse and pat dry prior to dressing wounds Topical:  thera derm moisturizing lotion to peri wound lower leg 1 x Per Week/30 Days Primary Dressing: Prisma 4.34 (in) 1 x Per Week/30 Days Discharge Instructions: Moisten w/normal saline or sterile water; Cover wound as directed. Do not remove from wound bed. Secondary Dressing: Gauze 1 x Per Week/30 Days Discharge Instructions: As directed: dry, moistened with saline or moistened with Dakins Solution Compression Wrap: Medichoice 4 layer Compression System, 35-40 mmHG 1 x Per Week/30 Days Discharge Instructions: Apply multi-layer wrap as directed. 1. Would recommend currently that we going to continue with the wound care measures as before and the patient is in agreement with the plan. This includes the use of the silver collagen dressing which I think has been beneficial. With that being said I do feel like that the 4-layer compression wrap is also been in direct benefit. 2. With regard to his knee he is going to be going to the orthopedic doctor in order to have this checked out. I think that is definitely still appropriate. Obviously if anything changes with regard to the knee pain in the interim he should let me know if we absolutely have to stop the wrap we can although I would prefer not to do because it is doing so well currently. [facility], [place] ([id number]) We will see patient back for reevaluation in 1 week here in the clinic. If anything worsens or changes patient will contact our office for additional recommendations. Electronic Signature(s) Signed: 06/30/2021 10:24:07 AM By: Worthy Keeler PA-C Entered By: Worthy Keeler on 06/30/2021 10:24:05 [facility], [place]  ([id number]) -------------------------------------------------------------------------------- SuperBill Details Patient Name: [name] Bean Date of Service: 06/30/2021 Medical Record Number: [id number] Patient Account Number: [id number] Date of Birth/Sex: Jan 30, 1935 (86 y.o. M) Treating RN: Donnamarie Poag Primary Care Provider: Harrel Lemon Other Clinician: Referring Provider: Harrel Lemon Treating Provider/Extender: Skipper Cliche in Treatment: 6 Diagnosis Coding ICD-10 Codes Code Description I87.2 Venous insufficiency (chronic) (peripheral) I89.0 Lymphedema, not elsewhere classified L97.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 layer exposed [place] (primary) hypertension Facility Procedures CPT4 Code: [id number] Description: (Facility Use Only) [phone] - [facility] LWR LT LEG Modifier: Quantity: 1 Physician Procedures CPT4 Code: [id number] Description: 15726 - WC PHYS LEVEL 3 - EST PT Modifier: Quantity: 1 CPT4 Code: Description: ICD-10 Diagnosis Description I87.2 Venous insufficiency (chronic) (peripheral) I89.0 Lymphedema, not elsewhere classified L97.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 lay I10 [place] (primary) hypertension Modifier: er exposed Quantity: Electronic Signature(s) Signed: 06/30/2021 10:24:58 AM By: Worthy Keeler PA-C Entered By: Worthy Keeler on 06/30/2021 10:24:55

## 2021-06-30 NOTE — Progress Notes (Addendum)
RYLIN, SEAVEY ([id number]) Visit Report for 06/30/2021 Arrival Information Details Patient Name: [name] Bean, [name] Bean Date of Service: 06/30/2021 9:30 AM Medical Record Number: [id number] Patient Account Number: [id number] Date of Birth/Sex: 1935-08-20 (86 y.o. M) Treating RN: Donnamarie Poag Primary Care Lometa Riggin: Harrel Lemon Other Clinician: Referring Farmer Mccahill: Harrel Lemon Treating Delorice Bannister/Extender: Skipper Cliche in Treatment: 6 Visit Information History Since Last Visit Added or deleted any medications: No Patient Arrived: Kasandra Knudsen Had a fall or experienced change in No Arrival Time: 09:44 activities of daily living that may affect Accompanied By: self risk of falls: Transfer Assistance: EasyPivot Patient Lift Hospitalized since last visit: No Patient Identification Verified: Yes Has Dressing in Place as Prescribed: Yes Secondary Verification Process Completed: Yes Has Compression in Place as Prescribed: Yes Patient Has Alerts: Yes Pain Present Now: Yes Patient Alerts: Patient on Blood Thinner Aspirin 88m Electronic Signature(s) Signed: 06/30/2021 3:14:11 PM By: BDonnamarie PoagEntered By: BDonnamarie Poagon 06/30/2021 09:44:54 [facility] JJeneen Rinks([id number] -------------------------------------------------------------------------------- Clinic Level of Care Assessment Details Patient Name: [name] MarkerDate of Service: 06/30/2021 9:30 AM Medical Record Number: [id number] Account Number: [id number] of Birth/Sex: 405-26-1936(86 y.o. M) Treating RN: BDonnamarie PoagPrimary Care Londen Bok: JHarrel LemonOther Clinician: Referring Camille Dragan: JHarrel LemonTreating Alese Furniss/Extender: SSkipper Clichein Treatment: 6 Clinic Level of Care Assessment Items TOOL 1 Quantity Score _0  - Use when EandM and Procedure is performed on INITIAL visit 0 ASSESSMENTS - Nursing Assessment / Reassessment _1  - General Physical Exam (combine w/ comprehensive assessment (listed just below) when performed  on new 0 pt. evals) _2  - 0 Comprehensive Assessment (HX, ROS, Risk Assessments, Wounds Hx, etc.) ASSESSMENTS - Wound and Skin Assessment / Reassessment _3  - Dermatologic / Skin Assessment (not related to wound area) 0 ASSESSMENTS - Ostomy and/or Continence Assessment and Care _4  - Incontinence Assessment and Management 0 _5  - 0 Ostomy Care Assessment and Management (repouching, etc.) PROCESS - Coordination of Care _6  - Simple Patient / Family Education for ongoing care 0 _7  - 0 Complex (extensive) Patient / Family Education for ongoing care _8  - 0 Staff obtains CProgrammer, systems Records, Test Results / Process Orders _9  - 0 Staff telephones HHA, Nursing Homes / Clarify orders / etc _10  - 0 Routine Transfer to another Facility (non-emergent condition) _11  - 0 Routine Hospital Admission (non-emergent condition) _12  - 0 New Admissions / IBiomedical engineer/ Ordering NPWT, Apligraf, etc. _13  - 0 Emergency Hospital Admission (emergent condition) PROCESS - Special Needs _14  - Pediatric / Minor Patient Management 0 _15  - 0 Isolation Patient Management _16  - 0 Hearing / Language / Visual special needs _17  - 0 Assessment of Community assistance (transportation, D/C planning, etc.) _18  - 0 Additional assistance / Altered mentation _19  - 0 Support Surface(s) Assessment (bed, cushion, seat, etc.) INTERVENTIONS - Miscellaneous _20  - External ear exam 0 _21  - 0 Patient Transfer (multiple staff / HCivil Service fast streamer/ Similar devices) _22  - 0 Simple Staple / Suture removal (25 or less) _23  - 0 Complex Staple / Suture removal (26 or more) _24  - 0 Hypo/Hyperglycemic Management (do not check if billed separately) _25  - 0 Ankle / Brachial Index (ABI) - do not check if billed separately Has the patient been seen at the hospital within the last three years: Yes Total Score: 0 Level Of Care: ____ [name] Bean([id number] Electronic Signature(s) Signed: 06/30/2021 3:14:11 PM By: BDonnamarie PoagEntered By:  BDonnamarie Poagon 06/30/2021 10:16:07 [name] Bean([id number] -------------------------------------------------------------------------------- Compression Therapy Details Patient Name: [name] MarkerDate of Service:  06/30/2021 9:30 AM Medical Record Number: [id number] Patient Account Number: [id number] Date of Birth/Sex: May 17, 1935 (86 y.o. M) Treating RN: Donnamarie Poag Primary Care Sargon Scouten: Harrel Lemon Other Clinician: Referring Thom Ollinger: Harrel Lemon Treating Jaeley Wiker/Extender: Skipper Cliche in Treatment: 6 Compression Therapy Performed for Wound Assessment: Wound #1 Left,Distal,Medial Lower Leg Performed By: Clinician Donnamarie Poag, RN Compression Type: Four Layer Post Procedure Diagnosis Same as Pre-procedure Electronic Signature(s) Signed: 06/30/2021 3:14:11 PM By: Donnamarie Poag Entered By: Donnamarie Poag on 06/30/2021 10:13:52 [name] ([id number]) -------------------------------------------------------------------------------- Encounter Discharge Information Details Patient Name: [name] Date of Service: 06/30/2021 9:30 AM Medical Record Number: [id number] Patient Account Number: [id number] Date of Birth/Sex: 11/03/1934 (86 y.o. M) Treating RN: Donnamarie Poag Primary Care Dedria Endres: Harrel Lemon Other Clinician: Referring Batsheva Stevick: Harrel Lemon Treating Verdun Rackley/Extender: Skipper Cliche in Treatment: 6 Encounter Discharge Information Items Discharge Condition: Stable Ambulatory Status: Cane Discharge Destination: Home Transportation: Private Auto Accompanied By: self Schedule Follow-up Appointment: Yes Clinical Summary of Care: Electronic Signature(s) Signed: 06/30/2021 3:14:11 PM By: Donnamarie Poag Entered By: Donnamarie Poag on 06/30/2021 10:17:30 [name] ([id number]) -------------------------------------------------------------------------------- Lower Extremity Assessment Details Patient Name: [name] Date of Service: 06/30/2021 9:30 AM Medical  Record Number: [id number] Patient Account Number: [id number] Date of Birth/Sex: 1935-03-28 (86 y.o. M) Treating RN: Donnamarie Poag Primary Care Gardy Montanari: Harrel Lemon Other Clinician: Referring Abisola Carrero: Harrel Lemon Treating Caydence Enck/Extender: Jeri Cos Weeks in Treatment: 6 Edema Assessment Assessed: [Left: Yes] [Right: No] Edema: [Left: Ye] [Right: s] Calf Left: Right: Point of Measurement: 40 cm From Medial Instep 43 cm Ankle Left: Right: Point of Measurement: 14 cm From Medial Instep 28 cm Knee To Floor Left: Right: From Medial Instep 47 cm Vascular Assessment Pulses: Dorsalis Pedis Palpable: [Left:Yes] Electronic Signature(s) Signed: 06/30/2021 3:14:11 PM By: Donnamarie Poag Entered By: Donnamarie Poag on 06/30/2021 09:53:11 [facility], [name] ([id number]) -------------------------------------------------------------------------------- Multi Wound Chart Details Patient Name: [name] Date of Service: 06/30/2021 9:30 AM Medical Record Number: [id number] Patient Account Number: [id number] Date of Birth/Sex: 08-09-1935 (86 y.o. M) Treating RN: Donnamarie Poag Primary Care Angelena Sand: Harrel Lemon Other Clinician: Referring Jaiden Wahab: Harrel Lemon Treating Kandee Escalante/Extender: Skipper Cliche in Treatment: 6 Vital Signs Height(in): 73 Pulse(bpm): 85 Weight(lbs): 260 Blood Pressure(mmHg): 110/69 Body Mass Index(BMI): 34 Temperature(F): 97.9 Respiratory Rate(breaths/min): 20 Photos: [N/A:N/A] Wound Location: Left, Distal, Medial Lower Leg N/A N/A Wounding Event: Bump N/A N/A Primary Etiology: Trauma, Other N/A N/A Secondary Etiology: Lymphedema N/A N/A Comorbid History: Cataracts, Anemia, Asthma, Sleep N/A N/A Apnea, Coronary Artery Disease, Osteoarthritis Date Acquired: 05/03/2021 N/A N/A Weeks of Treatment: 6 N/A N/A Wound Status: Open N/A N/A Clustered Wound: Yes N/A N/A Measurements L x W x D (cm) 2.3x1.8x0.1 N/A N/A Area (cm) : 3.252 N/A N/A Volume (cm) : 0.325  N/A N/A % Reduction in Area: 57.00% N/A N/A % Reduction in Volume: 85.70% N/A N/A Classification: Full Thickness Without Exposed N/A N/A Support Structures Exudate Amount: Medium N/A N/A Exudate Type: Serosanguineous N/A N/A Exudate Color: red, brown N/A N/A Granulation Amount: Large (67-100%) N/A N/A Granulation Quality: Red, Pink N/A N/A Necrotic Amount: Small (1-33%) N/A N/A Exposed Structures: Fat Layer (Subcutaneous Tissue): N/A N/A Yes Fascia: No Tendon: No Muscle: No Joint: No Bone: No Epithelialization: Small (1-33%) N/A N/A Treatment Notes Electronic Signature(s) Signed: 06/30/2021 3:14:11 PM By: Donnamarie Poag Entered By: Donnamarie Poag on 06/30/2021 10:13:16 [facility], [name] ([id number]) [facility], [name] ([id number]) -------------------------------------------------------------------------------- Multi-Disciplinary Care Plan Details Patient Name: [name] Date of Service: 06/30/2021 9:30 AM Medical Record Number: [id number] Patient Account Number: [id number] Date  of Birth/Sex: 1934/10/04 (86 y.o. M) Treating RN: Donnamarie Poag Primary Care Ruperto Kiernan: Harrel Lemon Other Clinician: Referring Darline Faith: Harrel Lemon Treating Selenia Mihok/Extender: Skipper Cliche in Treatment: 6 Active Inactive Wound/Skin Impairment Nursing Diagnoses: Impaired tissue integrity Knowledge deficit related to smoking impact on wound healing Knowledge deficit related to ulceration/compromised skin integrity Goals: Ulcer/skin breakdown will have a volume reduction of 30% by week 4 Date Initiated: 05/17/2021 Date Inactivated: 06/30/2021 Target Resolution Date: 06/16/2021 Goal Status: Met Ulcer/skin breakdown will have a volume reduction of 50% by week 8 Date Initiated: 05/17/2021 Target Resolution Date: 07/17/2021 Goal Status: Active Ulcer/skin breakdown will have a volume reduction of 80% by week 12 Date Initiated: 05/17/2021 Target Resolution Date: 08/16/2021 Goal Status: Active Ulcer/skin  breakdown will heal within 14 weeks Date Initiated: 05/17/2021 Target Resolution Date: 08/30/2021 Goal Status: Active Interventions: Assess patient/caregiver ability to obtain necessary supplies Assess patient/caregiver ability to perform ulcer/skin care regimen upon admission and as needed Assess ulceration(s) every visit Notes: Electronic Signature(s) Signed: 06/30/2021 3:14:11 PM By: Donnamarie Poag Entered By: Donnamarie Poag on 06/30/2021 10:12:59 [name] ([id number]) -------------------------------------------------------------------------------- Pain Assessment Details Patient Name: [name] Date of Service: 06/30/2021 9:30 AM Medical Record Number: [id number] Patient Account Number: [id number] Date of Birth/Sex: [birth year] (86 y.o. M) Treating RN: Donnamarie Poag Primary Care Joscelynn Brutus: Harrel Lemon Other Clinician: Referring Tobin Witucki: Harrel Lemon Treating Averie Hornbaker/Extender: Skipper Cliche in Treatment: 6 Active Problems Location of Pain Severity and Description of Pain Patient Has Paino Yes Site Locations Pain Location: Generalized Pain Rate the pain. Current Pain Level: 8 Pain Management and Medication Current Pain Management: Electronic Signature(s) Signed: 06/30/2021 3:14:11 PM By: Donnamarie Poag Entered By: Donnamarie Poag on 06/30/2021 09:47:52 [facility], [name] ([id number]) -------------------------------------------------------------------------------- Patient/Caregiver Education Details Patient Name: [name] Date of Service: 06/30/2021 9:30 AM Medical Record Number: [id number] Patient Account Number: [id number] Date of Birth/Gender: 10/01/1934 (86 y.o. M) Treating RN: Donnamarie Poag Primary Care Physician: Harrel Lemon Other Clinician: Referring Physician: Harrel Lemon Treating Physician/Extender: Skipper Cliche in Treatment: 6 Education Assessment Education Provided To: Patient Education Topics Provided Wound/Skin Impairment: Electronic  Signature(s) Signed: 06/30/2021 3:14:11 PM By: Donnamarie Poag Entered By: Donnamarie Poag on 06/30/2021 10:16:34 [name] ([id number]) -------------------------------------------------------------------------------- Wound Assessment Details Patient Name: [name] Date of Service: 06/30/2021 9:30 AM Medical Record Number: [id number] Patient Account Number: [id number] Date of Birth/Sex: 03-May-1935 (86 y.o. M) Treating RN: Donnamarie Poag Primary Care Laney Louderback: Harrel Lemon Other Clinician: Referring Patina Spanier: Harrel Lemon Treating Cloteal Isaacson/Extender: Jeri Cos Weeks in Treatment: 6 Wound Status Wound Number: 1 Primary Trauma, Other Etiology: Wound Location: Left, Distal, Medial Lower Leg Secondary Lymphedema Wounding Event: Bump Etiology: Date Acquired: 05/03/2021 Wound Status: Open Weeks Of Treatment: 6 Comorbid Cataracts, Anemia, Asthma, Sleep Apnea, Coronary Clustered Wound: Yes History: Artery Disease, Osteoarthritis Photos Wound Measurements Length: (cm) 2.3 Width: (cm) 1.8 Depth: (cm) 0.1 Area: (cm) 3.252 Volume: (cm) 0.325 % Reduction in Area: 57% % Reduction in Volume: 85.7% Epithelialization: Small (1-33%) Tunneling: No Undermining: No Wound Description Classification: Full Thickness Without Exposed Support Structu Exudate Amount: Medium Exudate Type: Serosanguineous Exudate Color: red, brown res Foul Odor After Cleansing: No Slough/Fibrino Yes Wound Bed Granulation Amount: Large (67-100%) Exposed Structure Granulation Quality: Red, Pink Fascia Exposed: No Necrotic Amount: Small (1-33%) Fat Layer (Subcutaneous Tissue) Exposed: Yes Necrotic Quality: Adherent Slough Tendon Exposed: No Muscle Exposed: No Joint Exposed: No Bone Exposed: No Treatment Notes Wound #1 (Lower Leg) Wound Laterality: Left, Medial, Distal Cleanser Soap and Water Discharge Instruction: Gently cleanse wound with antibacterial soap, rinse  and pat dry prior to dressing  wounds Peri-Wound Care [facility] ([id number]) Topical thera derm moisturizing lotion to peri wound lower leg Primary Dressing Prisma 4.34 (in) Discharge Instruction: Moisten w/normal saline or sterile water; Cover wound as directed. Do not remove from wound bed. Secondary Dressing Gauze Discharge Instruction: As directed: dry, moistened with saline or moistened with Dakins Solution Secured With Compression Wrap Medichoice 4 layer Compression System, 35-40 mmHG Discharge Instruction: Apply multi-layer wrap as directed. Compression Stockings Add-Ons Electronic Signature(s) Signed: 06/30/2021 3:14:11 PM By: Donnamarie Poag Entered By: Donnamarie Poag on 06/30/2021 09:51:44 [facility], [name] ([id number]) -------------------------------------------------------------------------------- Vitals Details Patient Name: [name] Date of Service: 06/30/2021 9:30 AM Medical Record Number: [id number] Patient Account Number: [id number] Date of Birth/Sex: 1935/05/18 (86 y.o. M) Treating RN: Donnamarie Poag Primary Care Kaydynce Pat: Harrel Lemon Other Clinician: Referring Alaisha Eversley: Harrel Lemon Treating Osie Merkin/Extender: Skipper Cliche in Treatment: 6 Vital Signs Time Taken: 09:45 Temperature (F): 97.9 Height (in): 73 Pulse (bpm): 85 Weight (lbs): 260 Respiratory Rate (breaths/min): 20 Body Mass Index (BMI): 34.3 Blood Pressure (mmHg): 110/69 Reference Range: 80 - 120 mg / dl Electronic Signature(s) Signed: 06/30/2021 3:14:11 PM By: Donnamarie Poag Entered ByDonnamarie Poag on 06/30/2021 09:47:22

## 2021-07-07 ENCOUNTER — Encounter: Payer: Medicare Other | Attending: Physician Assistant | Admitting: Physician Assistant

## 2021-07-07 ENCOUNTER — Other Ambulatory Visit: Payer: Self-pay

## 2021-07-07 DIAGNOSIS — I872 Venous insufficiency (chronic) (peripheral): Secondary | ICD-10-CM | POA: Diagnosis not present

## 2021-07-07 DIAGNOSIS — L97812 Non-pressure chronic ulcer of other part of right lower leg with fat layer exposed: Secondary | ICD-10-CM | POA: Diagnosis not present

## 2021-07-07 DIAGNOSIS — L97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 layer exposed: Secondary | ICD-10-CM | POA: Diagnosis not present

## 2021-07-07 DIAGNOSIS — I890 Lymphedema, not elsewhere classified: Secondary | ICD-10-CM | POA: Insufficient documentation

## 2021-07-07 DIAGNOSIS — I10 Essential (primary) hypertension: Secondary | ICD-10-CM | POA: Diagnosis not present

## 2021-07-07 NOTE — Progress Notes (Addendum)
[name], Bean ([id number]) Visit Report for 07/07/2021 Arrival Information Details Patient Name: [name] Bean, [name] Bean Date of Service: 07/07/2021 9:30 AM Medical Record Number: [id number] Patient Account Number: [id number] Date of Birth/Sex: September 19, 1934 (86 y.o. M) Treating RN: Donnamarie Poag Primary Care Marianny Goris: Harrel Lemon Other Clinician: Referring Hanadi Stanly: Harrel Lemon Treating Cana Mignano/Extender: Skipper Cliche in Treatment: 7 Visit Information History Since Last Visit Added or deleted any medications: No Patient Arrived: Kasandra Knudsen Had a fall or experienced change in No Arrival Time: 09:40 activities of daily living that may affect Accompanied By: self risk of falls: Transfer Assistance: None Hospitalized since last visit: No Patient Has Alerts: Yes Has Dressing in Place as Prescribed: Yes Patient Alerts: Patient on Blood Thinner Has Compression in Place as Prescribed: Yes Aspirin 51m Pain Present Now: Yes Electronic Signature(s) Signed: 07/07/2021 4:28:15 PM By: BDonnamarie PoagEntered By: BDonnamarie Poagon 07/07/2021 09:47:39 [name] Bean([id number] -------------------------------------------------------------------------------- Clinic Level of Care Assessment Details Patient Name: [name] MarkerDate of Service: 07/07/2021 9:30 AM Medical Record Number: [id number] Account Number: [id number] of Birth/Sex: [birth year](86 y.o. M) Treating RN: BDonnamarie PoagPrimary Care Shereda Graw: JHarrel LemonOther Clinician: Referring Kelsy Polack: JHarrel LemonTreating Raquan Iannone/Extender: SSkipper Clichein Treatment: 7 Clinic Level of Care Assessment Items TOOL 1 Quantity Score _0  - Use when EandM and Procedure is performed on INITIAL visit 0 ASSESSMENTS - Nursing Assessment / Reassessment _1  - General Physical Exam (combine w/ comprehensive assessment (listed just below) when performed on new 0 pt. evals) _2  - 0 Comprehensive Assessment (HX, ROS, Risk Assessments, Wounds Hx,  etc.) ASSESSMENTS - Wound and Skin Assessment / Reassessment _3  - Dermatologic / Skin Assessment (not related to wound area) 0 ASSESSMENTS - Ostomy and/or Continence Assessment and Care _4  - Incontinence Assessment and Management 0 _5  - 0 Ostomy Care Assessment and Management (repouching, etc.) PROCESS - Coordination of Care _6  - Simple Patient / Family Education for ongoing care 0 _7  - 0 Complex (extensive) Patient / Family Education for ongoing care _8  - 0 Staff obtains CProgrammer, systems Records, Test Results / Process Orders _9  - 0 Staff telephones HHA, Nursing Homes / Clarify orders / etc _10  - 0 Routine Transfer to another Facility (non-emergent condition) _11  - 0 Routine Hospital Admission (non-emergent condition) _12  - 0 New Admissions / IBiomedical engineer/ Ordering NPWT, Apligraf, etc. _13  - 0 Emergency Hospital Admission (emergent condition) PROCESS - Special Needs _14  - Pediatric / Minor Patient Management 0 _15  - 0 Isolation Patient Management _16  - 0 Hearing / Language / Visual special needs _17  - 0 Assessment of Community assistance (transportation, D/C planning, etc.) _18  - 0 Additional assistance / Altered mentation _19  - 0 Support Surface(s) Assessment (bed, cushion, seat, etc.) INTERVENTIONS - Miscellaneous _20  - External ear exam 0 _21  - 0 Patient Transfer (multiple staff / HCivil Service fast streamer/ Similar devices) _22  - 0 Simple Staple / Suture removal (25 or less) _23  - 0 Complex Staple / Suture removal (26 or more) _24  - 0 Hypo/Hyperglycemic Management (do not check if billed separately) _25  - 0 Ankle / Brachial Index (ABI) - do not check if billed separately Has the patient been seen at the hospital within the last three years: Yes Total Score: 0 Level Of Care: ____ [name] Bean([id number] Electronic Signature(s) Signed: 07/07/2021 4:28:15 PM By: BDonnamarie PoagEntered By: BDonnamarie Poagon 07/07/2021 10:27:44 [name] Bean ([id number] -------------------------------------------------------------------------------- Compression Therapy Details Patient Name: [name] MarkerDate of Service: 07/07/2021 9:30 AM Medical Record Number: [id number] Account Number: [id number]  Date of Birth/Sex: 02/16/1935 (86 y.o. M) Treating RN: Donnamarie Poag Primary Care Brent Taillon: Harrel Lemon Other Clinician: Referring Tank Difiore: Harrel Lemon Treating Jmichael Gille/Extender: Skipper Cliche in Treatment: 7 Compression Therapy Performed for Wound Assessment: Wound #1 Left,Distal,Medial Lower Leg Performed By: Junius Argyle, RN Compression Type: Four Layer Post Procedure Diagnosis Same as Pre-procedure Electronic Signature(s) Signed: 07/07/2021 4:28:15 PM By: Donnamarie Poag Entered By: Donnamarie Poag on 07/07/2021 10:24:21 [name] ([id number]) -------------------------------------------------------------------------------- Encounter Discharge Information Details Patient Name: [name] Date of Service: 07/07/2021 9:30 AM Medical Record Number: [id number] Patient Account Number: [id number] Date of Birth/Sex: [birth year] (86 y.o. M) Treating RN: Donnamarie Poag Primary Care Gregrey Bloyd: Harrel Lemon Other Clinician: Referring Darwin Guastella: Harrel Lemon Treating Yaretsi Humphres/Extender: Skipper Cliche in Treatment: 7 Encounter Discharge Information Items Post Procedure Vitals Discharge Condition: Stable Temperature (F): 98.3 Ambulatory Status: Cane Pulse (bpm): 87 Discharge Destination: Home Respiratory Rate (breaths/min): 16 Transportation: Private Auto Blood Pressure (mmHg): 119/67 Accompanied By: self Schedule Follow-up Appointment: Yes Clinical Summary of Care: Electronic Signature(s) Signed: 07/07/2021 4:28:15 PM By: Donnamarie Poag Entered By: Donnamarie Poag on 07/07/2021 10:35:44 [name] ([id number]) -------------------------------------------------------------------------------- Lower Extremity Assessment  Details Patient Name: [name] Date of Service: 07/07/2021 9:30 AM Medical Record Number: [id number] Patient Account Number: [id number] Date of Birth/Sex: 1934/11/12 (86 y.o. M) Treating RN: Donnamarie Poag Primary Care Armond Cuthrell: Harrel Lemon Other Clinician: Referring Cele Mote: Harrel Lemon Treating Ariani Seier/Extender: Jeri Cos Weeks in Treatment: 7 Edema Assessment Assessed: [Left: Yes] [Right: No] [Left: Edema] [Right: :] Calf Left: Right: Point of Measurement: 40 cm From Medial Instep 43 cm Ankle Left: Right: Point of Measurement: 14 cm From Medial Instep 27.5 cm Knee To Floor Left: Right: From Medial Instep 47 cm Vascular Assessment Pulses: Dorsalis Pedis Palpable: [Left:Yes] Electronic Signature(s) Signed: 07/07/2021 4:28:15 PM By: Donnamarie Poag Entered By: Donnamarie Poag on 07/07/2021 09:55:37 [facility], [name] ([id number]) -------------------------------------------------------------------------------- Multi Wound Chart Details Patient Name: [name] Date of Service: 07/07/2021 9:30 AM Medical Record Number: [id number] Patient Account Number: [id number] Date of Birth/Sex: [birth year] (86 y.o. M) Treating RN: Donnamarie Poag Primary Care Emmah Bratcher: Harrel Lemon Other Clinician: Referring Sevana Grandinetti: Harrel Lemon Treating Ludell Zacarias/Extender: Skipper Cliche in Treatment: 7 Vital Signs Height(in): 73 Pulse(bpm): 80 Weight(lbs): 260 Blood Pressure(mmHg): 119/67 Body Mass Index(BMI): 34 Temperature(F): 98.3 Respiratory Rate(breaths/min): 16 Photos: [N/A:N/A] Wound Location: Left, Distal, Medial Lower Leg N/A N/A Wounding Event: Bump N/A N/A Primary Etiology: Trauma, Other N/A N/A Secondary Etiology: Lymphedema N/A N/A Comorbid History: Cataracts, Anemia, Asthma, Sleep N/A N/A Apnea, Coronary Artery Disease, Osteoarthritis Date Acquired: 05/03/2021 N/A N/A Weeks of Treatment: 7 N/A N/A Wound Status: Open N/A N/A Clustered Wound: Yes N/A N/A Measurements L x W x D  (cm) 1.5x1.5x0.1 N/A N/A Area (cm) : 1.767 N/A N/A Volume (cm) : 0.177 N/A N/A % Reduction in Area: 76.60% N/A N/A % Reduction in Volume: 92.20% N/A N/A Classification: Full Thickness Without Exposed N/A N/A Support Structures Exudate Amount: Medium N/A N/A Exudate Type: Serosanguineous N/A N/A Exudate Color: red, brown N/A N/A Granulation Amount: Large (67-100%) N/A N/A Granulation Quality: Red, Pink N/A N/A Necrotic Amount: Small (1-33%) N/A N/A Exposed Structures: Fat Layer (Subcutaneous Tissue): N/A N/A Yes Fascia: No Tendon: No Muscle: No Joint: No Bone: No Epithelialization: Small (1-33%) N/A N/A Treatment Notes Electronic Signature(s) Signed: 07/07/2021 4:28:15 PM By: Donnamarie Poag Entered By: Donnamarie Poag on 07/07/2021 10:23:36 [facility], [name] ([id number]) [facility], [name] ([id number]) -------------------------------------------------------------------------------- Multi-Disciplinary Care Plan Details Patient Name: [name] Date of Service: 07/07/2021 9:30 AM Medical Record Number: [id number]  Patient Account Number: [id number] Date of Birth/Sex: 1934-10-15 (86 y.o. M) Treating RN: Donnamarie Poag Primary Care Isai Gottlieb: Harrel Lemon Other Clinician: Referring Josiyah Tozzi: Harrel Lemon Treating Teresha Hanks/Extender: Skipper Cliche in Treatment: 7 Active Inactive Wound/Skin Impairment Nursing Diagnoses: Impaired tissue integrity Knowledge deficit related to smoking impact on wound healing Knowledge deficit related to ulceration/compromised skin integrity Goals: Ulcer/skin breakdown will have a volume reduction of 30% by week 4 Date Initiated: 05/17/2021 Date Inactivated: 06/30/2021 Target Resolution Date: 06/16/2021 Goal Status: Met Ulcer/skin breakdown will have a volume reduction of 50% by week 8 Date Initiated: 05/17/2021 Target Resolution Date: 07/17/2021 Goal Status: Active Ulcer/skin breakdown will have a volume reduction of 80% by week 12 Date Initiated:  05/17/2021 Target Resolution Date: 08/16/2021 Goal Status: Active Ulcer/skin breakdown will heal within 14 weeks Date Initiated: 05/17/2021 Target Resolution Date: 08/30/2021 Goal Status: Active Interventions: Assess patient/caregiver ability to obtain necessary supplies Assess patient/caregiver ability to perform ulcer/skin care regimen upon admission and as needed Assess ulceration(s) every visit Notes: Electronic Signature(s) Signed: 07/07/2021 4:28:15 PM By: Donnamarie Poag Entered By: Donnamarie Poag on 07/07/2021 09:56:00 [name] ([id number]) -------------------------------------------------------------------------------- Pain Assessment Details Patient Name: [name] Date of Service: 07/07/2021 9:30 AM Medical Record Number: [id number] Patient Account Number: [id number] Date of Birth/Sex: [birth year] (86 y.o. M) Treating RN: Donnamarie Poag Primary Care Honore Wipperfurth: Harrel Lemon Other Clinician: Referring Christophr Calix: Harrel Lemon Treating Philamena Kramar/Extender: Skipper Cliche in Treatment: 7 Active Problems Location of Pain Severity and Description of Pain Patient Has Paino Yes Site Locations Pain Location: Generalized Pain, Pain in Ulcers Rate the pain. Current Pain Level: 8 Pain Management and Medication Current Pain Management: Electronic Signature(s) Signed: 07/07/2021 4:28:15 PM By: Donnamarie Poag Entered By: Donnamarie Poag on 07/07/2021 09:53:14 [facility], [name] ([id number]) -------------------------------------------------------------------------------- Patient/Caregiver Education Details Patient Name: [name] Date of Service: 07/07/2021 9:30 AM Medical Record Number: [id number] Patient Account Number: [id number] Date of Birth/Gender: June 06, 1935 (86 y.o. M) Treating RN: Donnamarie Poag Primary Care Physician: Harrel Lemon Other Clinician: Referring Physician: Harrel Lemon Treating Physician/Extender: Skipper Cliche in Treatment: 7 Education Assessment Education Provided  To: Patient Education Topics Provided Wound/Skin Impairment: Electronic Signature(s) Signed: 07/07/2021 4:28:15 PM By: Donnamarie Poag Entered By: Donnamarie Poag on 07/07/2021 10:34:16 [name] ([id number]) -------------------------------------------------------------------------------- Wound Assessment Details Patient Name: [name] Date of Service: 07/07/2021 9:30 AM Medical Record Number: [id number] Patient Account Number: [id number] Date of Birth/Sex: Jun 01, 1935 (86 y.o. M) Treating RN: Donnamarie Poag Primary Care Drucella Karbowski: Harrel Lemon Other Clinician: Referring Terrilynn Postell: Harrel Lemon Treating Fama Muenchow/Extender: Jeri Cos Weeks in Treatment: 7 Wound Status Wound Number: 1 Primary Trauma, Other Etiology: Wound Location: Left, Distal, Medial Lower Leg Secondary Lymphedema Wounding Event: Bump Etiology: Date Acquired: 05/03/2021 Wound Status: Open Weeks Of Treatment: 7 Comorbid Cataracts, Anemia, Asthma, Sleep Apnea, Coronary Clustered Wound: Yes History: Artery Disease, Osteoarthritis Photos Wound Measurements Length: (cm) 1.5 Width: (cm) 1.5 Depth: (cm) 0.1 Area: (cm) 1.767 Volume: (cm) 0.177 % Reduction in Area: 76.6% % Reduction in Volume: 92.2% Epithelialization: Small (1-33%) Tunneling: No Undermining: No Wound Description Classification: Full Thickness Without Exposed Support Structu Exudate Amount: Medium Exudate Type: Serosanguineous Exudate Color: red, brown res Foul Odor After Cleansing: No Slough/Fibrino Yes Wound Bed Granulation Amount: Large (67-100%) Exposed Structure Granulation Quality: Red, Pink Fascia Exposed: No Necrotic Amount: Small (1-33%) Fat Layer (Subcutaneous Tissue) Exposed: Yes Necrotic Quality: Adherent Slough Tendon Exposed: No Muscle Exposed: No Joint Exposed: No Bone Exposed: No Treatment Notes Wound #1 (Lower Leg) Wound Laterality: Left, Medial, Distal Cleanser Soap and Water Discharge  Instruction: Gently cleanse  wound with antibacterial soap, rinse and pat dry prior to dressing wounds Peri-Wound Care [facility] ([id number]) Topical thera derm moisturizing lotion to peri wound lower leg Primary Dressing Prisma 4.34 (in) Discharge Instruction: Moisten w/normal saline or sterile water; Cover wound as directed. Do not remove from wound bed. Secondary Dressing Gauze Discharge Instruction: As directed: dry, moistened with saline or moistened with Dakins Solution Secured With Compression Wrap Medichoice 4 layer Compression System, 35-40 mmHG Discharge Instruction: Apply multi-layer wrap as directed. Compression Stockings Add-Ons Electronic Signature(s) Signed: 07/07/2021 4:28:15 PM By: Donnamarie Poag Entered By: Donnamarie Poag on 07/07/2021 09:54:26 [facility], [name] ([id number]) -------------------------------------------------------------------------------- Vitals Details Patient Name: [name] Date of Service: 07/07/2021 9:30 AM Medical Record Number: [id number] Patient Account Number: [id number] Date of Birth/Sex: [birth year] (86 y.o. M) Treating RN: Donnamarie Poag Primary Care Torrence Hammack: Harrel Lemon Other Clinician: Referring Rami Budhu: Harrel Lemon Treating Aashritha Miedema/Extender: Skipper Cliche in Treatment: 7 Vital Signs Time Taken: 09:45 Temperature (F): 98.3 Height (in): 73 Pulse (bpm): 80 Weight (lbs): 260 Respiratory Rate (breaths/min): 16 Body Mass Index (BMI): 34.3 Blood Pressure (mmHg): 119/67 Reference Range: 80 - 120 mg / dl Electronic Signature(s) Signed: 07/07/2021 4:28:15 PM By: Donnamarie Poag Entered ByDonnamarie Poag on 07/07/2021 09:48:31

## 2021-07-07 NOTE — Progress Notes (Addendum)
SHIVAAY, STORMONT ([id number]) Visit Report for 07/07/2021 Chief Complaint Document Details Patient Name: [name] Bean Date of Service: 07/07/2021 9:30 AM Medical Record Number: [id number] Patient Account Number: [id number] Date of Birth/Sex: November 28, 1934 (86 y.o. M) Treating RN: Donnamarie Poag Primary Care Provider: Harrel Lemon Other Clinician: Referring Provider: Harrel Lemon Treating Provider/Extender: Skipper Cliche in Treatment: 7 Information Obtained from: Patient Chief Complaint Left LE Ulcer Electronic Signature(s) Signed: 07/07/2021 9:42:59 AM By: Worthy Keeler PA-C Entered By: Worthy Keeler on 07/07/2021 09:42:58 [facility], [place] ([id number]) -------------------------------------------------------------------------------- Debridement Details Patient Name: [name] Bean Date of Service: 07/07/2021 9:30 AM Medical Record Number: [id number] Patient Account Number: [id number] Date of Birth/Sex: Sep 04, 1935 (86 y.o. M) Treating RN: Donnamarie Poag Primary Care Provider: Harrel Lemon Other Clinician: Referring Provider: Harrel Lemon Treating Provider/Extender: Skipper Cliche in Treatment: 7 Debridement Performed for Wound #1 Left,Distal,Medial Lower Leg Assessment: Performed By: Physician Tommie Sams., PA-C Debridement Type: Debridement Level of Consciousness (Pre- Awake and Alert procedure): Pre-procedure Verification/Time Out Yes - 10:23 Taken: Start Time: 10:23 Pain Control: Lidocaine Total Area Debrided (L x W): 1 (cm) x 1 (cm) = 1 (cm) Tissue and other material Viable, Non-Viable, Slough, Subcutaneous, Slough debrided: Level: Skin/Subcutaneous Tissue Debridement Description: Excisional Instrument: Curette Bleeding: Minimum Hemostasis Achieved: Pressure Response to Treatment: Procedure was tolerated well Level of Consciousness (Post- Awake and Alert procedure): Post Debridement Measurements of Total Wound Length: (cm) 1.5 Width: (cm) 1.5 Depth: (cm)  0.1 Volume: (cm) 0.177 Character of Wound/Ulcer Post Debridement: Improved Post Procedure Diagnosis Same as Pre-procedure Electronic Signature(s) Signed: 07/07/2021 4:28:15 PM By: Donnamarie Poag Signed: 07/07/2021 5:06:33 PM By: Worthy Keeler PA-C Entered By: Donnamarie Poag on 07/07/2021 10:25:27 [name] Bean ([id number]) -------------------------------------------------------------------------------- HPI Details Patient Name: [name] Bean Date of Service: 07/07/2021 9:30 AM Medical Record Number: [id number] Patient Account Number: [id number] Date of Birth/Sex: 10-29-1934 (86 y.o. M) Treating RN: Donnamarie Poag Primary Care Provider: Harrel Lemon Other Clinician: Referring Provider: Harrel Lemon Treating Provider/Extender: Skipper Cliche in Treatment: 7 History of Present Illness HPI Description: 05/17/2021 upon evaluation today patient presents for initial evaluation here in the clinic concerning issues that he has been healing. This again is not terribly uncommon especially with the significant swelling that he is experiencing. He does not have the compression wraps or compression stockings he tells me has never been told he should wear those but he definitely should be wearing those. Fortunately there does not appear to be any evidence of active infection at this time which is great news systemically though locally I do see some evidence of maceration and there is some question as to whether or not honestly he may have some infection present. He definitely has some slough and biofilm that needs to be cleared away. Either way I think with appropriate compression we can definitely get things moving in the right direction. Patient does have a history of chronic venous stasis, lymphedema, and his wounds unfortunately are right in the lower extremity on the left where he does have significant swelling and lymphedema. This means this can make things somewhat more difficult to heal in general.  Other than this he also has a history of hypertension. 05/24/2021 upon evaluation today patient appears to be doing decently well in regard to his wound. He has been tolerating the dressing changes without complication. Fortunately there does not appear to be any evidence of active infection at this point which is great news and overall I am pleased in that regard. Nonetheless I do believe that  the patient is still having quite a bit of discomfort at least when I cleaned the wound but overall I think we are headed in the appropriate direction. No fevers, chills, nausea, vomiting, or diarrhea. 06/09/2021 upon evaluation today patient appears to be doing well with regard to his wound. He has been tolerating the dressing changes without complication. Fortunately there does not appear to be any signs of active infection which is great news and overall very pleased with where we stand today. Nonetheless there is been made to be some sharp debridement performed today. Also think that the patient probably needs a stronger compression I would recommend that we increase to a 4-layer compression wrap. 06/23/2021 upon evaluation today patient appears to be doing better in regard to his leg ulcer. He has been tolerating the dressing changes without complication and overall I am extremely pleased with where we stand I think he is headed in the appropriate direction and this is healing quite nicely. 06/30/2021 upon evaluation today patient appears to be doing well with regard to his wound. In fact this is measuring significantly smaller and overall seems to be doing great. I am very pleased I am hopeful to get this done soon and he will be out of here. He is definitely ready to get out of the Wrap as this is causing a lot of problems with his knee. 07/07/2021 upon evaluation today patient's wound is actually showing signs of excellent improvement which is great news. Fortunately I think he is headed in the right  direction and were getting closer to complete resolution. Electronic Signature(s) Signed: 07/07/2021 3:07:42 PM By: Worthy Keeler PA-C Entered By: Worthy Keeler on 07/07/2021 15:07:42 [facility], [place] ([id number]) -------------------------------------------------------------------------------- Physical Exam Details Patient Name: [name] Bean Date of Service: 07/07/2021 9:30 AM Medical Record Number: [id number] Patient Account Number: [id number] Date of Birth/Sex: 18-Apr-1935 (86 y.o. M) Treating RN: Donnamarie Poag Primary Care Provider: Harrel Lemon Other Clinician: Referring Provider: Harrel Lemon Treating Provider/Extender: Jeri Cos Weeks in Treatment: 7 Constitutional Well-nourished and well-hydrated in no acute distress. Respiratory normal breathing without difficulty. Psychiatric this patient is able to make decisions and demonstrates good insight into disease process. Alert and Oriented x 3. pleasant and cooperative. Notes Upon inspection patient's wound bed showed signs of good granulation epithelization at this point. Fortunately there does not appear to be any evidence of infection right now which is great news and overall I think that debridement though necessary is can be minimal today. I was able to clean with slough and necrotic debris down to good subcutaneous tissue he tolerated that without complication minimal bleeding noted postdebridement. Electronic Signature(s) Signed: 07/07/2021 3:08:07 PM By: Worthy Keeler PA-C Entered By: Worthy Keeler on 07/07/2021 15:08:06 [facility], [place] ([id number]) -------------------------------------------------------------------------------- Physician Orders Details Patient Name: [name] Bean Date of Service: 07/07/2021 9:30 AM Medical Record Number: [id number] Patient Account Number: [id number] Date of Birth/Sex: August 18, 1935 (86 y.o. M) Treating RN: Donnamarie Poag Primary Care Provider: Harrel Lemon Other Clinician: Referring Provider:  Harrel Lemon Treating Provider/Extender: Skipper Cliche in Treatment: 7 Verbal / Phone Orders: No Diagnosis Coding ICD-10 Coding Code Description I87.2 Venous insufficiency (chronic) (peripheral) I89.0 Lymphedema, not elsewhere classified L97.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 layer exposed [place] (primary) hypertension Follow-up Appointments o Return Appointment in 1 week. o Nurse Visit as needed Bathing/ Shower/ Hygiene o May shower with wound dressing protected with water repellent cover or cast protector. o No tub bath. Edema  Control - Lymphedema / Segmental Compressive Device / Other o Optional: One layer of unna paste to top of compression wrap (to act as an anchor). o Patient to wear own compression stockings. Remove compression stockings every night before going to bed and put on every morning when getting up. - right leg o Elevate legs to the level of the heart and pump ankles as often as possible o Elevate leg(s) parallel to the floor when sitting. o DO YOUR BEST to sleep in the bed at night. DO NOT sleep in your recliner. Long hours of sitting in a recliner leads to swelling of the legs and/or potential wounds on your backside. Additional Orders / Instructions o Follow Nutritious Diet and Increase Protein Intake Wound Treatment Wound #1 - Lower Leg Wound Laterality: Left, Medial, Distal Cleanser: Soap and Water 1 x Per Week/30 Days Discharge Instructions: Gently cleanse wound with antibacterial soap, rinse and pat dry prior to dressing wounds Topical: thera derm moisturizing lotion to peri wound lower leg 1 x Per Week/30 Days Primary Dressing: Prisma 4.34 (in) 1 x Per Week/30 Days Discharge Instructions: Moisten w/normal saline or sterile water; Cover wound as directed. Do not remove from wound bed. Secondary Dressing: Gauze 1 x Per Week/30 Days Discharge Instructions: As directed: dry, moistened with  saline or moistened with Dakins Solution Compression Wrap: Medichoice 4 layer Compression System, 35-40 mmHG 1 x Per Week/30 Days Discharge Instructions: Apply multi-layer wrap as directed. Electronic Signature(s) Signed: 07/07/2021 4:28:15 PM By: Donnamarie Poag Signed: 07/07/2021 5:06:33 PM By: Worthy Keeler PA-C Entered By: Donnamarie Poag on 07/07/2021 10:26:07 [name] Bean ([id number]) -------------------------------------------------------------------------------- Problem List Details Patient Name: [name] Bean Date of Service: 07/07/2021 9:30 AM Medical Record Number: [id number] Patient Account Number: [id number] Date of Birth/Sex: 1935-06-15 (86 y.o. M) Treating RN: Donnamarie Poag Primary Care Provider: Harrel Lemon Other Clinician: Referring Provider: Harrel Lemon Treating Provider/Extender: Skipper Cliche in Treatment: 7 Active Problems ICD-10 Encounter Code Description Active Date MDM Diagnosis I87.2 Venous insufficiency (chronic) (peripheral) 05/17/2021 No Yes I89.0 Lymphedema, not elsewhere classified 05/17/2021 No Yes L97.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 layer 05/17/2021 No Yes exposed [place] (primary) hypertension 05/17/2021 No Yes Inactive Problems Resolved Problems Electronic Signature(s) Signed: 07/07/2021 9:42:48 AM By: Worthy Keeler PA-C Entered By: Worthy Keeler on 07/07/2021 09:42:48 [facility], [place] ([id number]) -------------------------------------------------------------------------------- Progress Note Details Patient Name: [name] Bean Date of Service: 07/07/2021 9:30 AM Medical Record Number: [id number] Patient Account Number: [id number] Date of Birth/Sex: 11/09/1934 (86 y.o. M) Treating RN: Donnamarie Poag Primary Care Provider: Harrel Lemon Other Clinician: Referring Provider: Harrel Lemon Treating Provider/Extender: Skipper Cliche in Treatment: 7 Subjective Chief Complaint Information obtained from  Patient Left LE Ulcer History of Present Illness (HPI) 05/17/2021 upon evaluation today patient presents for initial evaluation here in the clinic concerning issues that he has been healing. This again is not terribly uncommon especially with the significant swelling that he is experiencing. He does not have the compression wraps or compression stockings he tells me has never been told he should wear those but he definitely should be wearing those. Fortunately there does not appear to be any evidence of active infection at this time which is great news systemically though locally I do see some evidence of maceration and there is some question as to whether or not honestly he may have some infection present. He definitely has some slough and biofilm that needs to be cleared away. Either way I think  with appropriate compression we can definitely get things moving in the right direction. Patient does have a history of chronic venous stasis, lymphedema, and his wounds unfortunately are right in the lower extremity on the left where he does have significant swelling and lymphedema. This means this can make things somewhat more difficult to heal in general. Other than this he also has a history of hypertension. 05/24/2021 upon evaluation today patient appears to be doing decently well in regard to his wound. He has been tolerating the dressing changes without complication. Fortunately there does not appear to be any evidence of active infection at this point which is great news and overall I am pleased in that regard. Nonetheless I do believe that the patient is still having quite a bit of discomfort at least when I cleaned the wound but overall I think we are headed in the appropriate direction. No fevers, chills, nausea, vomiting, or diarrhea. 06/09/2021 upon evaluation today patient appears to be doing well with regard to his wound. He has been tolerating the dressing changes without complication.  Fortunately there does not appear to be any signs of active infection which is great news and overall very pleased with where we stand today. Nonetheless there is been made to be some sharp debridement performed today. Also think that the patient probably needs a stronger compression I would recommend that we increase to a 4-layer compression wrap. 06/23/2021 upon evaluation today patient appears to be doing better in regard to his leg ulcer. He has been tolerating the dressing changes without complication and overall I am extremely pleased with where we stand I think he is headed in the appropriate direction and this is healing quite nicely. 06/30/2021 upon evaluation today patient appears to be doing well with regard to his wound. In fact this is measuring significantly smaller and overall seems to be doing great. I am very pleased I am hopeful to get this done soon and he will be out of here. He is definitely ready to get out of the Wrap as this is causing a lot of problems with his knee. 07/07/2021 upon evaluation today patient's wound is actually showing signs of excellent improvement which is great news. Fortunately I think he is headed in the right direction and were getting closer to complete resolution. Objective Constitutional Well-nourished and well-hydrated in no acute distress. Vitals Time Taken: 9:45 AM, Height: 73 in, Weight: 260 lbs, BMI: 34.3, Temperature: 98.3 F, Pulse: 80 bpm, Respiratory Rate: 16 breaths/min, Blood Pressure: 119/67 mmHg. Respiratory normal breathing without difficulty. Psychiatric this patient is able to make decisions and demonstrates good insight into disease process. Alert and Oriented x 3. pleasant and cooperative. General Notes: Upon inspection patient's wound bed showed signs of good granulation epithelization at this point. Fortunately there does not appear to be any evidence of infection right now which is great news and overall I think that  debridement though necessary is can be minimal [facility] ([id number]) today. I was able to clean with slough and necrotic debris down to good subcutaneous tissue he tolerated that without complication minimal bleeding noted postdebridement. Integumentary (Hair, Skin) Wound #1 status is Open. Original cause of wound was Bump. The date acquired was: 05/03/2021. The wound has been in treatment 7 weeks. The wound is located on the Left,Distal,Medial Lower Leg. The wound measures 1.5cm length x 1.5cm width x 0.1cm depth; 1.767cm^2 area and 0.177cm^3 volume. There is Fat Layer (Subcutaneous Tissue) exposed. There is no tunneling or undermining noted. There  is a medium amount of serosanguineous drainage noted. There is large (67-100%) red, pink granulation within the wound bed. There is a small (1-33%) amount of necrotic tissue within the wound bed including Adherent Slough. Assessment Active Problems ICD-10 Venous insufficiency (chronic) (peripheral) Lymphedema, not elsewhere classified Non-pressure chronic ulcer of other part of left lower leg with fat layer exposed [place] (primary) hypertension Procedures Wound #1 Pre-procedure diagnosis of Wound #1 is a Trauma, Other located on the Left,Distal,Medial Lower Leg . There was a Excisional Skin/Subcutaneous Tissue Debridement with a total area of 1 sq cm performed by Tommie Sams., PA-C. With the following instrument(s): Curette to remove Viable and Non-Viable tissue/material. Material removed includes Subcutaneous Tissue and Slough and after achieving pain control using Lidocaine. A time out was conducted at 10:23, prior to the start of the procedure. A Minimum amount of bleeding was controlled with Pressure. The procedure was tolerated well. Post Debridement Measurements: 1.5cm length x 1.5cm width x 0.1cm depth; 0.177cm^3 volume. Character of Wound/Ulcer Post Debridement is improved. Post procedure Diagnosis Wound #1: Same as  Pre-Procedure Pre-procedure diagnosis of Wound #1 is a Trauma, Other located on the Left,Distal,Medial Lower Leg . There was a Four Layer Compression Therapy Procedure by Donnamarie Poag, RN. Post procedure Diagnosis Wound #1: Same as Pre-Procedure Plan Follow-up Appointments: Return Appointment in 1 week. Nurse Visit as needed Bathing/ Shower/ Hygiene: May shower with wound dressing protected with water repellent cover or cast protector. No tub bath. Edema Control - Lymphedema / Segmental Compressive Device / Other: Optional: One layer of unna paste to top of compression wrap (to act as an anchor). Patient to wear own compression stockings. Remove compression stockings every night before going to bed and put on every morning when getting up. - right leg Elevate legs to the level of the heart and pump ankles as often as possible Elevate leg(s) parallel to the floor when sitting. DO YOUR BEST to sleep in the bed at night. DO NOT sleep in your recliner. Long hours of sitting in a recliner leads to swelling of the legs and/or potential wounds on your backside. Additional Orders / Instructions: Follow Nutritious Diet and Increase Protein Intake WOUND #1: - Lower Leg Wound Laterality: Left, Medial, Distal Cleanser: Soap and Water 1 x Per Week/30 Days Discharge Instructions: Gently cleanse wound with antibacterial soap, rinse and pat dry prior to dressing wounds Topical: thera derm moisturizing lotion to peri wound lower leg 1 x Per Week/30 Days Primary Dressing: Prisma 4.34 (in) 1 x Per Week/30 Days Discharge Instructions: Moisten w/normal saline or sterile water; Cover wound as directed. Do not remove from wound bed. Secondary Dressing: Gauze 1 x Per Week/30 Days Discharge Instructions: As directed: dry, moistened with saline or moistened with Dakins [employer], [place] ([id number]) Compression Wrap: Medichoice 4 layer Compression System, 35-40 mmHG 1 x Per Week/30 Days Discharge  Instructions: Apply multi-layer wrap as directed. 1. Would recommend currently that we going continue with the wound care measures as before and the patient is in agreement with plan. This includes the use of the silver collagen dressing which I think is doing a good job for the patient currently. 2. I am also can recommend that we have the patient continue with the compression wrapping we have been utilizing a 4-layer compression wrap which is doing a great job as well. We will see patient back for reevaluation in 1 week here in the clinic. If anything worsens or changes patient will contact our office for additional  recommendations. Electronic Signature(s) Signed: 07/07/2021 3:08:39 PM By: Worthy Keeler PA-C Entered By: Worthy Keeler on 07/07/2021 15:08:38 [facility] [place], [place] ([id number]) -------------------------------------------------------------------------------- SuperBill Details Patient Name: [name] Bean Date of Service: 07/07/2021 Medical Record Number: [id number] Patient Account Number: [id number] Date of Birth/Sex: 09-23-1934 (86 y.o. M) Treating RN: Donnamarie Poag Primary Care Provider: Harrel Lemon Other Clinician: Referring Provider: Harrel Lemon Treating Provider/Extender: Skipper Cliche in Treatment: 7 Diagnosis Coding ICD-10 Codes Code Description I87.2 Venous insufficiency (chronic) (peripheral) I89.0 Lymphedema, not elsewhere classified L97.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 layer exposed [place] (primary) hypertension Facility Procedures CPT4 Code: [id number] Description: 68088 - DEB SUBQ TISSUE 20 SQ CM/< Modifier: Quantity: 1 CPT4 Code: Description: ICD-10 Diagnosis Description L97.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 layer Modifier: exposed Quantity: Physician Procedures CPT4 Code: 1103159 Description: 45859 - WC PHYS SUBQ TISS 20 SQ CM Modifier: Quantity: 1 CPT4  Code: Description: ICD-10 Diagnosis Description L97.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 layer Modifier: exposed Quantity: Electronic Signature(s) Signed: 07/07/2021 3:30:19 PM By: Worthy Keeler PA-C Entered By: Worthy Keeler on 07/07/2021 15:30:18

## 2021-07-11 NOTE — Progress Notes (Deleted)
07/12/2021 8:28 AM   [name] 1934/10/11 [id number]  Referring provider: Baxter Hire, MD [place],  [place] [address]  No chief complaint on file.  Urological history: 1. BPH with LU TS -PSA 0.22 in 04/2021 -cysto 01/2021 -  TUR changes with adenoma regrowth prostate and moderate trabeculation -chronic urinary frequency, urgency, urge incontinence -no significant provement tamsulosin, oxybutynin and Myrbetriq -I PSS *** -PVR ***   2.  Chronic bacteriuria - Only complains of malodorous urine   3.  History of hypogonadism - Previously on TRT  HPI: [name] Bean is a 86 y.o. male who presents today for frequent and painful urination.  He complains of frequent urination for months that comes and goes.  He is also experiencing suprapubic pain that comes and goes that has been going on for months.    Patient denies any modifying or aggravating factors.  Patient denies any gross hematuria or suprapubic/flank pain.  Patient denies any fevers, chills, nausea or vomiting.    UA nitrite positive, > 30 WBC's and many bacteria.    PVR 66 mL   He is a difficult historian as he had a difficult time staying on topic and perseverates on his leg swelling and back pain.     Score:  1-7 Mild 8-19 Moderate 20-35 Severe   PMH: Past Medical History:  Diagnosis Date   Arthritis    left knee;    Hyperlipidemia    Urethral stricture     Surgical History: No past surgical history on file.  Home Medications:  Allergies as of 07/12/2021       Reactions   Amoxicillin Swelling   Amoxicillin-pot Clavulanate Hives, Shortness Of Breath, Swelling   Pt states he had to go to the hospital because of this situation Pt states he had to go to the hospital because of this situation   Mecobalamin Nausea Only        Medication List        Accurate as of July 11, 2021  8:28 AM. If you have any questions, ask your nurse or doctor.          albuterol  108 (90 Base) MCG/ACT inhaler Commonly known as: VENTOLIN HFA Inhale into the lungs.   aspirin EC 81 MG tablet Take 81 mg by mouth.   augmented betamethasone dipropionate 0.05 % ointment Commonly known as: DIPROLENE-AF APPLY TO AFFECTED AREAS TWICE DAILY UNTIL CLEAR   azelastine 0.1 % nasal spray Commonly known as: ASTELIN SPRAY ONCE IN EACH NOSTRIL TWICE DAILY   bisacodyl 5 MG EC tablet Commonly known as: DULCOLAX daily.   diclofenac Sodium 1 % Gel Commonly known as: VOLTAREN APPLY 4 GRAMS TO AFFECTED AREA 4 TIMES A DAY   docusate sodium 100 MG capsule Commonly known as: COLACE Take by mouth.   doxycycline 100 MG capsule Commonly known as: VIBRAMYCIN Take 100 mg by mouth 2 (two) times daily.   DULoxetine 20 MG capsule Commonly known as: CYMBALTA Take 20 mg by mouth daily.   fluticasone 50 MCG/ACT nasal spray Commonly known as: FLONASE SPRAY 2 SPRAYS INTO EACH NOSTRIL EVERY DAY   gabapentin 300 MG capsule Commonly known as: NEURONTIN Take 300 mg by mouth 2 (two) times daily.   loratadine 10 MG tablet Commonly known as: CLARITIN TAKE 1 TABLET (10 MG TOTAL) BY MOUTH ONCE DAILY. (NOT COVER)   meloxicam 15 MG tablet Commonly known as: MOBIC Take 15 mg by mouth daily.   methenamine 1 g  tablet Commonly known as: HIPREX Take 1 tablet (1 g total) by mouth 2 (two) times daily with a meal.   metolazone 2.5 MG tablet Commonly known as: ZAROXOLYN Take 2.5 mg by mouth every other day.   mupirocin ointment 2 % Commonly known as: BACTROBAN APPLY TO AFFECTED AREA 3 TIMES A DAY FOR 7 DAYS   omeprazole 40 MG capsule Commonly known as: PRILOSEC Take 40 mg by mouth daily.   oxybutynin 10 MG 24 hr tablet Commonly known as: DITROPAN-XL Take 10 mg by mouth daily.   oxyCODONE-acetaminophen 10-325 MG tablet Commonly known as: PERCOCET Take 1 tablet by mouth 4 (four) times daily as needed.   potassium chloride SA 20 MEQ tablet Commonly known as: KLOR-CON TAKE 1  TABLET (20 MEQ TOTAL) BY MOUTH 3 (THREE) TIMES DAILY.   tamsulosin 0.4 MG Caps capsule Commonly known as: FLOMAX Take 0.4 mg by mouth daily.   Testosterone 10 MG/ACT (2%) Gel Apply 20 mg topically daily.   torsemide 20 MG tablet Commonly known as: DEMADEX Take 1 tablet by mouth daily.   triamcinolone 0.025 % cream Commonly known as: KENALOG triamcinolone acetonide 0.025 % topical cream   Vitamin D3 25 MCG (1000 UT) Caps Take by mouth.        Allergies:  Allergies  Allergen Reactions   Amoxicillin Swelling   Amoxicillin-Pot Clavulanate Hives, Shortness Of Breath and Swelling    Pt states he had to go to the hospital because of this situation Pt states he had to go to the hospital because of this situation    Mecobalamin Nausea Only    Family History: No family history on file.  Social History:  reports that he quit smoking about 40 years ago. His smoking use included cigarettes. He has never used smokeless tobacco. He reports that he does not drink alcohol and does not use drugs.  ROS: Pertinent ROS in HPI  Physical Exam: There were no vitals taken for this visit.  Constitutional:  Well nourished. Alert and oriented, No acute distress. HEENT: [place] AT, moist mucus membranes.  Trachea midline Cardiovascular: No clubbing, cyanosis, or edema. Respiratory: Normal respiratory effort, no increased work of breathing. GI: Abdomen is soft, non tender, non distended, no abdominal masses. Liver and spleen not palpable.  No hernias appreciated.  Stool sample for occult testing is not indicated.   GU: No CVA tenderness.  No bladder fullness or masses.  Patient with circumcised/uncircumcised phallus. ***Foreskin easily retracted***  Urethral meatus is patent.  No penile discharge. No penile lesions or rashes. Scrotum without lesions, cysts, rashes and/or edema.  Testicles are located scrotally bilaterally. No masses are appreciated in the testicles. Left and right epididymis are  normal. Rectal: Patient with  normal sphincter tone. Anus and perineum without scarring or rashes. No rectal masses are appreciated. Prostate is approximately *** grams, *** nodules are appreciated. Seminal vesicles are normal. Skin: No rashes, bruises or suspicious lesions. Lymph: No inguinal adenopathy. Neurologic: Grossly intact, no focal deficits, moving all 4 extremities. Psychiatric: Normal mood and affect.   Laboratory Data: Glucose 70 - 110 mg/dL 112 High    Sodium 136 - 145 mmol/L 139   Potassium 3.6 - 5.1 mmol/L 3.8   Chloride 97 - 109 mmol/L 104   Carbon Dioxide (CO2) 22.0 - 32.0 mmol/L 27.5   Urea Nitrogen (BUN) 7 - 25 mg/dL 19   Creatinine 0.7 - 1.3 mg/dL 0.9   Glomerular Filtration Rate (eGFR), MDRD Estimate >60 mL/min/1.73sq m 97   Calcium  8.7 - 10.3 mg/dL 9.6   AST  8 - 39 U/L 20   ALT  6 - 57 U/L 11   Alk Phos (alkaline Phosphatase) 34 - 104 U/L 182 High    Albumin 3.5 - 4.8 g/dL 3.9   Bilirubin, Total 0.3 - 1.2 mg/dL 0.5   Protein, Total 6.1 - 7.9 g/dL 7.3   A/G Ratio 1.0 - 5.0 gm/dL 1.1   Resulting Agency  [facility]  Specimen Collected: 07/08/21 11:09 Last Resulted: 07/08/21 14:49  Received From: [facility]  Result Received: 07/11/21 08:28   WBC (White Blood Cell Count) 4.1 - 10.2 10^3/uL 8.8   RBC (Red Blood Cell Count) 4.69 - 6.13 10^6/uL 3.80 Low    Hemoglobin 14.1 - 18.1 gm/dL 11.1 Low    Hematocrit 40.0 - 52.0 % 32.8 Low    MCV (Mean Corpuscular Volume) 80.0 - 100.0 fl 86.3   MCH (Mean Corpuscular Hemoglobin) 27.0 - 31.2 pg 29.2   MCHC (Mean Corpuscular Hemoglobin Concentration) 32.0 - 36.0 gm/dL 33.8   Platelet Count 150 - 450 10^3/uL 185   RDW-CV (Red Cell Distribution Width) 11.6 - 14.8 % 14.0   MPV (Mean Platelet Volume) 9.4 - 12.4 fl 11.1   Neutrophils 1.50 - 7.80 10^3/uL 2.84   Lymphocytes 1.00 - 3.60 10^3/uL 1.76   Monocytes 0.00 - 1.50 10^3/uL 1.01   Eosinophils 0.00 - 0.55 10^3/uL 3.14 High    Basophils  0.00 - 0.09 10^3/uL 0.06   Neutrophil % 32.0 - 70.0 % 32.1   Lymphocyte % 10.0 - 50.0 % 20.0   Monocyte % 4.0 - 13.0 % 11.5   Eosinophil % 1.0 - 5.0 % 35.6 High    Comment: Smear reviewed, agrees with analyzer results, eosinophilia.   Basophil% 0.0 - 2.0 % 0.7   Immature Granulocyte % <=0.7 % 0.1   Immature Granulocyte Count <=0.06 10^3/L 0.01   Resulting [facility]  Specimen Collected: 07/08/21 11:09 Last Resulted: 07/08/21 16:22  Received From: [facility]  Result Received: 07/11/21 08:28   Color Yellow, Violet, Light Violet, Dark Violet Yellow   Clarity Clear, Other SL Cloudy Abnormal    Specific Gravity 1.000 - 1.030 1.020   pH, Urine 5.0 - 8.0 6.5   Protein, Urinalysis Negative, Trace mg/dL Negative   Glucose, Urinalysis Negative mg/dL Negative   Ketones, Urinalysis Negative mg/dL Negative   Blood, Urinalysis Negative Small Abnormal    Nitrite, Urinalysis Negative Positive Abnormal    Leukocyte Esterase, Urinalysis Negative Small Abnormal    White Blood Cells, Urinalysis None Seen, 0-3 /hpf 10-50 Abnormal    Red Blood Cells, Urinalysis None Seen, 0-3 /hpf 0-3   Bacteria, Urinalysis None Seen /hpf Many Abnormal    Squamous Epithelial Cells, Urinalysis Rare, Few, None Seen /hpf Rare   Resulting Agency  [facility]  Specimen Collected: 07/08/21 11:09 Last Resulted: 07/08/21 12:06  Received From: [facility]  Result Received: 07/11/21 08:28  I have reviewed the labs.   Pertinent Imaging: ***   Assessment & Plan:    1. Dysuria -UA nitrite positive, pyuria and many bacteria -Urine culture -will hold on prescribing antibiotics at this time unless he develops worsening UTI symptoms  2. Frequency -he is a difficult historian and has limited understanding of his medical issues.  He is also on multiple medications which may be contributing to his urinary issues.  I have advised him that  the testosterone  gel that he is applying may be contributing to his LE edema and the torsemide causes him to produce more urine which in turn causes him to void more frequently.   -suggest he have an appointment to discuss his medications and see if he may discontinue some of them-specifically the testosterone -discontinue the oxybutynin XL  No follow-ups on file.  These notes generated with voice recognition software. I apologize for typographical errors.  Zara Council, PA-C  [facility] [address]  [place], [place] [address] [phone]

## 2021-07-12 ENCOUNTER — Ambulatory Visit: Payer: Medicare Other | Admitting: Urology

## 2021-07-12 DIAGNOSIS — R350 Frequency of micturition: Secondary | ICD-10-CM

## 2021-07-14 ENCOUNTER — Encounter: Payer: Medicare Other | Admitting: Physician Assistant

## 2021-07-14 ENCOUNTER — Other Ambulatory Visit: Payer: Self-pay

## 2021-07-14 DIAGNOSIS — I872 Venous insufficiency (chronic) (peripheral): Secondary | ICD-10-CM | POA: Diagnosis not present

## 2021-07-14 NOTE — Progress Notes (Addendum)
ISON, WICHMANN ([id number]) Visit Report for 07/14/2021 Arrival Information Details Patient Name: [name] Bean, [name] Bean Date of Service: 07/14/2021 9:30 AM Medical Record Number: [id number] Patient Account Number: [id number] Date of Birth/Sex: [birth year] (86 y.o. M) Treating RN: Donnamarie Poag Primary Care Rosevelt Luu: Harrel Lemon Other Clinician: Referring Emilyrose Darrah: Harrel Lemon Treating Ariez Neilan/Extender: Skipper Cliche in Treatment: 8 Visit Information History Since Last Visit Added or deleted any medications: No Patient Arrived: [name] Had a fall or experienced change in No Arrival Time: 09:36 activities of daily living that may affect Accompanied By: self risk of falls: Transfer Assistance: EasyPivot Patient Lift Hospitalized since last visit: No Patient Identification Verified: Yes Has Dressing in Place as Prescribed: Yes Secondary Verification Process Completed: Yes Has Compression in Place as Prescribed: Yes Patient Has Alerts: Yes Pain Present Now: Yes Patient Alerts: Patient on Blood Thinner Aspirin 60m Electronic Signature(s) Signed: 07/14/2021 4:07:12 PM By: BDonnamarie PoagEntered By: BDonnamarie Poagon 07/14/2021 09:38:57 [place], JJeneen Rinks([id number] -------------------------------------------------------------------------------- Clinic Level of Care Assessment Details Patient Name: ECheral MarkerDate of Service: 07/14/2021 9:30 AM Medical Record Number: [id number] Account Number: [id number] of Birth/Sex: [birth year](86 y.o. M) Treating RN: BDonnamarie PoagPrimary Care Glenna Brunkow: JHarrel LemonOther Clinician: Referring Caidance Sybert: JHarrel LemonTreating Ohn Bostic/Extender: SSkipper Clichein Treatment: 8 Clinic Level of Care Assessment Items TOOL 1 Quantity Score _0  - Use when EandM and Procedure is performed on INITIAL visit 0 ASSESSMENTS - Nursing Assessment / Reassessment _1  - General Physical Exam (combine w/ comprehensive assessment (listed just below) when  performed on new 0 pt. evals) _2  - 0 Comprehensive Assessment (HX, ROS, Risk Assessments, Wounds Hx, etc.) ASSESSMENTS - Wound and Skin Assessment / Reassessment _3  - Dermatologic / Skin Assessment (not related to wound area) 0 ASSESSMENTS - Ostomy and/or Continence Assessment and Care _4  - Incontinence Assessment and Management 0 _5  - 0 Ostomy Care Assessment and Management (repouching, etc.) PROCESS - Coordination of Care _6  - Simple Patient / Family Education for ongoing care 0 _7  - 0 Complex (extensive) Patient / Family Education for ongoing care _8  - 0 Staff obtains CProgrammer, systems Records, Test Results / Process Orders _9  - 0 Staff telephones HHA, Nursing Homes / Clarify orders / etc _10  - 0 Routine Transfer to another Facility (non-emergent condition) _11  - 0 Routine Hospital Admission (non-emergent condition) _12  - 0 New Admissions / IBiomedical engineer/ Ordering NPWT, Apligraf, etc. _13  - 0 Emergency Hospital Admission (emergent condition) PROCESS - Special Needs _14  - Pediatric / Minor Patient Management 0 _15  - 0 Isolation Patient Management _16  - 0 Hearing / Language / Visual special needs _17  - 0 Assessment of Community assistance (transportation, D/C planning, etc.) _18  - 0 Additional assistance / Altered mentation _19  - 0 Support Surface(s) Assessment (bed, cushion, seat, etc.) INTERVENTIONS - Miscellaneous _20  - External ear exam 0 _21  - 0 Patient Transfer (multiple staff / HCivil Service fast streamer/ Similar devices) _22  - 0 Simple Staple / Suture removal (25 or less) _23  - 0 Complex Staple / Suture removal (26 or more) _24  - 0 Hypo/Hyperglycemic Management (do not check if billed separately) _25  - 0 Ankle / Brachial Index (ABI) - do not check if billed separately Has the patient been seen at the hospital within the last three years: Yes Total Score: 0 Level Of Care: ____ [name]([id number] Electronic Signature(s) Signed: 07/14/2021 4:07:12 PM By: BDonnamarie PoagEntered  By: BDonnamarie Poagon 07/14/2021 10:04:21 [name]([id number] -------------------------------------------------------------------------------- Compression Therapy Details Patient Name: ECheral MarkerDate of Service:  07/14/2021 9:30 AM Medical Record Number: [id number] Patient Account Number: [id number] Date of Birth/Sex: 1934/12/24 (86 y.o. M) Treating RN: Donnamarie Poag Primary Care Kamorie Aldous: Harrel Lemon Other Clinician: Referring Aleena Kirkeby: Harrel Lemon Treating Felisha Claytor/Extender: Skipper Cliche in Treatment: 8 Compression Therapy Performed for Wound Assessment: Wound #1 Left,Distal,Medial Lower Leg Performed By: Junius Argyle, RN Compression Type: Four Layer Post Procedure Diagnosis Same as Pre-procedure Electronic Signature(s) Signed: 07/14/2021 4:07:12 PM By: Donnamarie Poag Entered By: Donnamarie Poag on 07/14/2021 09:59:48 [facility], [name] ([id number]) -------------------------------------------------------------------------------- Encounter Discharge Information Details Patient Name: [name] Date of Service: 07/14/2021 9:30 AM Medical Record Number: [id number] Patient Account Number: [id number] Date of Birth/Sex: [birth year] (86 y.o. M) Treating RN: Donnamarie Poag Primary Care Rudolf Blizard: Harrel Lemon Other Clinician: Referring Khristian Phillippi: Harrel Lemon Treating Jerry Haugen/Extender: Skipper Cliche in Treatment: 8 Encounter Discharge Information Items Post Procedure Vitals Discharge Condition: Stable Temperature (F): 98.3 Ambulatory Status: Walker Pulse (bpm): 83 Discharge Destination: Home Respiratory Rate (breaths/min): 18 Transportation: Private Auto Blood Pressure (mmHg): 118/79 Accompanied By: self Schedule Follow-up Appointment: Yes Clinical Summary of Care: Electronic Signature(s) Signed: 07/14/2021 4:07:12 PM By: Donnamarie Poag Entered By: Donnamarie Poag on 07/14/2021 10:12:02 [name]  ([id number]) -------------------------------------------------------------------------------- Lower Extremity Assessment Details Patient Name: [name] Date of Service: 07/14/2021 9:30 AM Medical Record Number: [id number] Patient Account Number: [id number] Date of Birth/Sex: July 07, 1935 (86 y.o. M) Treating RN: Donnamarie Poag Primary Care Loyed Wilmes: Harrel Lemon Other Clinician: Referring Tiara Maultsby: Harrel Lemon Treating Hodari Chuba/Extender: Jeri Cos Weeks in Treatment: 8 Edema Assessment Assessed: [Left: Yes] [Right: No] Edema: [Left: Ye] [Right: s] Calf Left: Right: Point of Measurement: 40 cm From Medial Instep 43 cm Ankle Left: Right: Point of Measurement: 14 cm From Medial Instep 27.5 cm Knee To Floor Left: Right: From Medial Instep 47 cm Vascular Assessment Pulses: Dorsalis Pedis Palpable: [Left:Yes] Electronic Signature(s) Signed: 07/14/2021 4:07:12 PM By: Donnamarie Poag Entered By: Donnamarie Poag on 07/14/2021 09:45:01 [facility], [name] ([id number]) -------------------------------------------------------------------------------- Multi Wound Chart Details Patient Name: [name] Date of Service: 07/14/2021 9:30 AM Medical Record Number: [id number] Patient Account Number: [id number] Date of Birth/Sex: 1935-02-11 (86 y.o. M) Treating RN: Donnamarie Poag Primary Care Itsel Opfer: Harrel Lemon Other Clinician: Referring Ariana Juul: Harrel Lemon Treating Javoris Star/Extender: Skipper Cliche in Treatment: 8 Vital Signs Height(in): 73 Pulse(bpm): 41 Weight(lbs): 260 Blood Pressure(mmHg): 118/79 Body Mass Index(BMI): 34 Temperature(F): 98.3 Respiratory Rate(breaths/min): 18 Photos: [N/A:N/A] Wound Location: Left, Distal, Medial Lower Leg N/A N/A Wounding Event: Bump N/A N/A Primary Etiology: Trauma, Other N/A N/A Secondary Etiology: Lymphedema N/A N/A Comorbid History: Cataracts, Anemia, Asthma, Sleep N/A N/A Apnea, Coronary Artery Disease, Osteoarthritis Date Acquired:  05/03/2021 N/A N/A Weeks of Treatment: 8 N/A N/A Wound Status: Open N/A N/A Clustered Wound: Yes N/A N/A Measurements L x W x D (cm) 0.7x0.5x0.1 N/A N/A Area (cm) : 0.275 N/A N/A Volume (cm) : 0.027 N/A N/A % Reduction in Area: 96.40% N/A N/A % Reduction in Volume: 98.80% N/A N/A Classification: Full Thickness Without Exposed N/A N/A Support Structures Exudate Amount: Medium N/A N/A Exudate Type: Serosanguineous N/A N/A Exudate Color: red, brown N/A N/A Granulation Amount: Large (67-100%) N/A N/A Granulation Quality: Red, Pink N/A N/A Necrotic Amount: Small (1-33%) N/A N/A Exposed Structures: Fat Layer (Subcutaneous Tissue): N/A N/A Yes Fascia: No Tendon: No Muscle: No Joint: No Bone: No Epithelialization: Small (1-33%) N/A N/A Treatment Notes Electronic Signature(s) Signed: 07/14/2021 4:07:12 PM By: Donnamarie Poag Entered By: Donnamarie Poag on 07/14/2021 09:45:33 [facility], [name] ([id number]) [name] ([id number]) -------------------------------------------------------------------------------- Multi-Disciplinary Care Plan Details Patient Name:  [name] Date of Service: 07/14/2021 9:30 AM Medical Record Number: [id number] Patient Account Number: [id number] Date of Birth/Sex: Sep 15, 1934 (86 y.o. M) Treating RN: Donnamarie Poag Primary Care Shelvia Fojtik: Harrel Lemon Other Clinician: Referring Nishita Isaacks: Harrel Lemon Treating Umi Mainor/Extender: Skipper Cliche in Treatment: 8 Active Inactive Wound/Skin Impairment Nursing Diagnoses: Impaired tissue integrity Knowledge deficit related to smoking impact on wound healing Knowledge deficit related to ulceration/compromised skin integrity Goals: Ulcer/skin breakdown will have a volume reduction of 30% by week 4 Date Initiated: 05/17/2021 Date Inactivated: 06/30/2021 Target Resolution Date: 06/16/2021 Goal Status: Met Ulcer/skin breakdown will have a volume reduction of 50% by week 8 Date Initiated: 05/17/2021 Date Inactivated:  07/14/2021 Target Resolution Date: 07/17/2021 Goal Status: Met Ulcer/skin breakdown will have a volume reduction of 80% by week 12 Date Initiated: 05/17/2021 Target Resolution Date: 08/16/2021 Goal Status: Active Ulcer/skin breakdown will heal within 14 weeks Date Initiated: 05/17/2021 Target Resolution Date: 08/30/2021 Goal Status: Active Interventions: Assess patient/caregiver ability to obtain necessary supplies Assess patient/caregiver ability to perform ulcer/skin care regimen upon admission and as needed Assess ulceration(s) every visit Notes: Electronic Signature(s) Signed: 07/14/2021 4:07:12 PM By: Donnamarie Poag Entered By: Donnamarie Poag on 07/14/2021 09:45:17 [name] ([id number]) -------------------------------------------------------------------------------- Pain Assessment Details Patient Name: [name] Date of Service: 07/14/2021 9:30 AM Medical Record Number: [id number] Patient Account Number: [id number] Date of Birth/Sex: [birth year] (86 y.o. M) Treating RN: Donnamarie Poag Primary Care Shellie Goettl: Harrel Lemon Other Clinician: Referring Tanasha Menees: Harrel Lemon Treating Pamela Intrieri/Extender: Skipper Cliche in Treatment: 8 Active Problems Location of Pain Severity and Description of Pain Patient Has Paino Yes Site Locations Pain Location: Generalized Pain, Pain in Ulcers Rate the pain. Current Pain Level: 5 Pain Management and Medication Current Pain Management: Notes c/o arthritis Electronic Signature(s) Signed: 07/14/2021 4:07:12 PM By: Donnamarie Poag Entered By: Donnamarie Poag on 07/14/2021 09:43:33 [facility], [name] ([id number]) -------------------------------------------------------------------------------- Patient/Caregiver Education Details Patient Name: [name] Date of Service: 07/14/2021 9:30 AM Medical Record Number: [id number] Patient Account Number: [id number] Date of Birth/Gender: [birth year] (86 y.o. M) Treating RN: Donnamarie Poag Primary Care  Physician: Harrel Lemon Other Clinician: Referring Physician: Harrel Lemon Treating Physician/Extender: Skipper Cliche in Treatment: 8 Education Assessment Education Provided To: Patient Education Topics Provided Wound Debridement: Wound/Skin Impairment: Electronic Signature(s) Signed: 07/14/2021 4:07:12 PM By: Donnamarie Poag Entered By: Donnamarie Poag on 07/14/2021 10:04:38 [name] ([id number]) -------------------------------------------------------------------------------- Wound Assessment Details Patient Name: [name] Date of Service: 07/14/2021 9:30 AM Medical Record Number: [id number] Patient Account Number: [id number] Date of Birth/Sex: 1935/05/16 (86 y.o. M) Treating RN: Donnamarie Poag Primary Care Marveen Donlon: Harrel Lemon Other Clinician: Referring Hether Anselmo: Harrel Lemon Treating Kiandre Spagnolo/Extender: Jeri Cos Weeks in Treatment: 8 Wound Status Wound Number: 1 Primary Trauma, Other Etiology: Wound Location: Left, Distal, Medial Lower Leg Secondary Lymphedema Wounding Event: Bump Etiology: Date Acquired: 05/03/2021 Wound Status: Open Weeks Of Treatment: 8 Comorbid Cataracts, Anemia, Asthma, Sleep Apnea, Coronary Clustered Wound: Yes History: Artery Disease, Osteoarthritis Photos Wound Measurements Length: (cm) 0.7 Width: (cm) 0.5 Depth: (cm) 0.1 Area: (cm) 0.275 Volume: (cm) 0.027 % Reduction in Area: 96.4% % Reduction in Volume: 98.8% Epithelialization: Small (1-33%) Tunneling: No Undermining: No Wound Description Classification: Full Thickness Without Exposed Support Structu Exudate Amount: Medium Exudate Type: Serosanguineous Exudate Color: red, brown res Foul Odor After Cleansing: No Slough/Fibrino Yes Wound Bed Granulation Amount: Large (67-100%) Exposed Structure Granulation Quality: Red, Pink Fascia Exposed: No Necrotic Amount: Small (1-33%) Fat Layer (Subcutaneous Tissue) Exposed: Yes Necrotic Quality: Adherent Slough Tendon  Exposed: No Muscle Exposed: No Joint  Exposed: No Bone Exposed: No Treatment Notes Wound #1 (Lower Leg) Wound Laterality: Left, Medial, Distal Cleanser Soap and Water Discharge Instruction: Gently cleanse wound with antibacterial soap, rinse and pat dry prior to dressing wounds [facility] ([id number]) Topical thera derm moisturizing lotion to peri wound lower leg Primary Dressing Prisma 4.34 (in) Discharge Instruction: Moisten w/normal saline or sterile water; Cover wound as directed. Do not remove from wound bed. Secondary Dressing Gauze Discharge Instruction: As directed: dry, moistened with saline or moistened with Dakins Solution Secured With Compression Wrap Medichoice 4 layer Compression System, 35-40 mmHG Discharge Instruction: Apply multi-layer wrap as directed. Compression Stockings Add-Ons Electronic Signature(s) Signed: 07/14/2021 4:07:12 PM By: Donnamarie Poag Entered By: Donnamarie Poag on 07/14/2021 09:44:19 [facility], [name] ([id number]) -------------------------------------------------------------------------------- Vitals Details Patient Name: [name] Date of Service: 07/14/2021 9:30 AM Medical Record Number: [id number] Patient Account Number: [id number] Date of Birth/Sex: 1935/05/04 (86 y.o. M) Treating RN: Donnamarie Poag Primary Care Tacy Chavis: Harrel Lemon Other Clinician: Referring Giovannina Mun: Harrel Lemon Treating Gustavo Meditz/Extender: Skipper Cliche in Treatment: 8 Vital Signs Time Taken: 09:40 Temperature (F): 98.3 Height (in): 73 Pulse (bpm): 83 Weight (lbs): 260 Respiratory Rate (breaths/min): 18 Body Mass Index (BMI): 34.3 Blood Pressure (mmHg): 118/79 Reference Range: 80 - 120 mg / dl Electronic Signature(s) Signed: 07/14/2021 4:07:12 PM By: Donnamarie Poag Entered ByDonnamarie Poag on 07/14/2021 09:43:20

## 2021-07-14 NOTE — Progress Notes (Addendum)
[name] Bean, [name] Bean ([id number]) Visit Report for 07/14/2021 Chief Complaint Document Details Patient Name: [name] Bean, [name] Bean Date of Service: 07/14/2021 9:30 AM Medical Record Number: [id number] Patient Account Number: [id number] Date of Birth/Sex: 09/30/1934 (86 y.o. M) Treating RN: Donnamarie Poag Primary Care Provider: Harrel Lemon Other Clinician: Referring Provider: Harrel Lemon Treating Provider/Extender: Skipper Cliche in Treatment: 8 Information Obtained from: Patient Chief Complaint Left LE Ulcer Electronic Signature(s) Signed: 07/14/2021 9:10:07 AM By: Worthy Keeler PA-C Entered By: Worthy Keeler on 07/14/2021 09:10:06 [facility], [place] ([id number]) -------------------------------------------------------------------------------- Debridement Details Patient Name: [name] Bean Date of Service: 07/14/2021 9:30 AM Medical Record Number: [id number] Patient Account Number: [id number] Date of Birth/Sex: 1934/09/19 (86 y.o. M) Treating RN: Donnamarie Poag Primary Care Provider: Harrel Lemon Other Clinician: Referring Provider: Harrel Lemon Treating Provider/Extender: Skipper Cliche in Treatment: 8 Debridement Performed for Wound #1 Left,Distal,Medial Lower Leg Assessment: Performed By: Physician Tommie Sams., PA-C Debridement Type: Debridement Level of Consciousness (Pre- Awake and Alert procedure): Pre-procedure Verification/Time Out Yes - 10:00 Taken: Start Time: 10:00 Total Area Debrided (L x W): 0.7 (cm) x 0.5 (cm) = 0.35 (cm) Tissue and other material Viable, Non-Viable, Slough, Subcutaneous, Slough debrided: Level: Skin/Subcutaneous Tissue Debridement Description: Excisional Instrument: Curette Bleeding: Minimum Hemostasis Achieved: Pressure Response to Treatment: Procedure was tolerated well Level of Consciousness (Post- Awake and Alert procedure): Post Debridement Measurements of Total Wound Length: (cm) 0.7 Width: (cm) 0.5 Depth: (cm) 0.2 Volume: (cm)  0.055 Character of Wound/Ulcer Post Debridement: Improved Post Procedure Diagnosis Same as Pre-procedure Electronic Signature(s) Signed: 07/14/2021 4:07:12 PM By: Donnamarie Poag Signed: 07/14/2021 5:14:46 PM By: Worthy Keeler PA-C Entered By: Donnamarie Poag on 07/14/2021 10:00:57 [name] Bean ([id number]) -------------------------------------------------------------------------------- HPI Details Patient Name: [name] Bean Date of Service: 07/14/2021 9:30 AM Medical Record Number: [id number] Patient Account Number: [id number] Date of Birth/Sex: [birth year] (86 y.o. M) Treating RN: Donnamarie Poag Primary Care Provider: Harrel Lemon Other Clinician: Referring Provider: Harrel Lemon Treating Provider/Extender: Skipper Cliche in Treatment: 8 History of Present Illness HPI Description: 05/17/2021 upon evaluation today patient presents for initial evaluation here in the clinic concerning issues that he has been healing. This again is not terribly uncommon especially with the significant swelling that he is experiencing. He does not have the compression wraps or compression stockings he tells me has never been told he should wear those but he definitely should be wearing those. Fortunately there does not appear to be any evidence of active infection at this time which is great news systemically though locally I do see some evidence of maceration and there is some question as to whether or not honestly he may have some infection present. He definitely has some slough and biofilm that needs to be cleared away. Either way I think with appropriate compression we can definitely get things moving in the right direction. Patient does have a history of chronic venous stasis, lymphedema, and his wounds unfortunately are right in the lower extremity on the left where he does have significant swelling and lymphedema. This means this can make things somewhat more difficult to heal in general. Other than this he  also has a history of hypertension. 05/24/2021 upon evaluation today patient appears to be doing decently well in regard to his wound. He has been tolerating the dressing changes without complication. Fortunately there does not appear to be any evidence of active infection at this point which is great news and overall I am pleased in that regard. Nonetheless I do believe that the patient is  still having quite a bit of discomfort at least when I cleaned the wound but overall I think we are headed in the appropriate direction. No fevers, chills, nausea, vomiting, or diarrhea. 06/09/2021 upon evaluation today patient appears to be doing well with regard to his wound. He has been tolerating the dressing changes without complication. Fortunately there does not appear to be any signs of active infection which is great news and overall very pleased with where we stand today. Nonetheless there is been made to be some sharp debridement performed today. Also think that the patient probably needs a stronger compression I would recommend that we increase to a 4-layer compression wrap. 06/23/2021 upon evaluation today patient appears to be doing better in regard to his leg ulcer. He has been tolerating the dressing changes without complication and overall I am extremely pleased with where we stand I think he is headed in the appropriate direction and this is healing quite nicely. 06/30/2021 upon evaluation today patient appears to be doing well with regard to his wound. In fact this is measuring significantly smaller and overall seems to be doing great. I am very pleased I am hopeful to get this done soon and he will be out of here. He is definitely ready to get out of the Wrap as this is causing a lot of problems with his knee. 07/07/2021 upon evaluation today patient's wound is actually showing signs of excellent improvement which is great news. Fortunately I think he is headed in the right direction and were  getting closer to complete resolution. 07/14/2021 upon evaluation today patient appears to be doing well with regard to his leg ulcers. Fortunately I do not see any signs of active infection which is great news. Overall I think that he is actually doing quite well. Electronic Signature(s) Signed: 07/14/2021 5:06:41 PM By: Worthy Keeler PA-C Entered By: Worthy Keeler on 07/14/2021 17:06:41 [facility], [place] ([id number]) -------------------------------------------------------------------------------- Physical Exam Details Patient Name: [name] Bean Date of Service: 07/14/2021 9:30 AM Medical Record Number: [id number] Patient Account Number: [id number] Date of Birth/Sex: 02/06/1935 (86 y.o. M) Treating RN: Donnamarie Poag Primary Care Provider: Harrel Lemon Other Clinician: Referring Provider: Harrel Lemon Treating Provider/Extender: Jeri Cos Weeks in Treatment: 8 Constitutional Well-nourished and well-hydrated in no acute distress. Respiratory normal breathing without difficulty. Psychiatric this patient is able to make decisions and demonstrates good insight into disease process. Alert and Oriented x 3. pleasant and cooperative. Notes Patient's wound bed actually showed signs of some need for sharp debridement postdebridement appears to be doing much better which is great news. With that being said the patient unfortunately is mainly having pain in his left knee but states he cannot have anything done surgically which needs to be done until the wounds are healed. Electronic Signature(s) Signed: 07/14/2021 5:07:25 PM By: Worthy Keeler PA-C Entered By: Worthy Keeler on 07/14/2021 17:07:25 [facility] [place], [place] ([id number]) -------------------------------------------------------------------------------- Physician Orders Details Patient Name: [name] Bean Date of Service: 07/14/2021 9:30 AM Medical Record Number: [id number] Patient Account Number: [id number] Date of Birth/Sex: Mar 04, 1935 (86  y.o. M) Treating RN: Donnamarie Poag Primary Care Provider: Harrel Lemon Other Clinician: Referring Provider: Harrel Lemon Treating Provider/Extender: Skipper Cliche in Treatment: 8 Verbal / Phone Orders: No Diagnosis Coding ICD-10 Coding Code Description I87.2 Venous insufficiency (chronic) (peripheral) I89.0 Lymphedema, not elsewhere classified L97.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 layer exposed [place] (primary) hypertension Follow-up Appointments o Return Appointment in 1 week. o Nurse  Visit as needed Bathing/ Shower/ Hygiene o May shower with wound dressing protected with water repellent cover or cast protector. o No tub bath. Edema Control - Lymphedema / Segmental Compressive Device / Other o Optional: One layer of unna paste to top of compression wrap (to act as an anchor). o Patient to wear own compression stockings. Remove compression stockings every night before going to bed and put on every morning when getting up. - right leg o Elevate legs to the level of the heart and pump ankles as often as possible o Elevate leg(s) parallel to the floor when sitting. o DO YOUR BEST to sleep in the bed at night. DO NOT sleep in your recliner. Long hours of sitting in a recliner leads to swelling of the legs and/or potential wounds on your backside. Additional Orders / Instructions o Follow Nutritious Diet and Increase Protein Intake Wound Treatment Wound #1 - Lower Leg Wound Laterality: Left, Medial, Distal Cleanser: Soap and Water 1 x Per Week/30 Days Discharge Instructions: Gently cleanse wound with antibacterial soap, rinse and pat dry prior to dressing wounds Topical: thera derm moisturizing lotion to peri wound lower leg 1 x Per Week/30 Days Primary Dressing: Prisma 4.34 (in) 1 x Per Week/30 Days Discharge Instructions: Moisten w/normal saline or sterile water; Cover wound as directed. Do not remove from wound  bed. Secondary Dressing: Gauze 1 x Per Week/30 Days Discharge Instructions: As directed: dry, moistened with saline or moistened with Dakins Solution Compression Wrap: Medichoice 4 layer Compression System, 35-40 mmHG 1 x Per Week/30 Days Discharge Instructions: Apply multi-layer wrap as directed. Electronic Signature(s) Signed: 07/14/2021 4:07:12 PM By: Donnamarie Poag Signed: 07/14/2021 5:14:46 PM By: Worthy Keeler PA-C Entered By: Donnamarie Poag on 07/14/2021 10:04:15 [name] Bean ([id number]) -------------------------------------------------------------------------------- Problem List Details Patient Name: [name] Bean Date of Service: 07/14/2021 9:30 AM Medical Record Number: [id number] Patient Account Number: [id number] Date of Birth/Sex: 06/23/1935 (86 y.o. M) Treating RN: Donnamarie Poag Primary Care Provider: Harrel Lemon Other Clinician: Referring Provider: Harrel Lemon Treating Provider/Extender: Skipper Cliche in Treatment: 8 Active Problems ICD-10 Encounter Code Description Active Date MDM Diagnosis I87.2 Venous insufficiency (chronic) (peripheral) 05/17/2021 No Yes I89.0 Lymphedema, not elsewhere classified 05/17/2021 No Yes L97.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 layer 05/17/2021 No Yes exposed [place] (primary) hypertension 05/17/2021 No Yes Inactive Problems Resolved Problems Electronic Signature(s) Signed: 07/14/2021 9:10:02 AM By: Worthy Keeler PA-C Entered By: Worthy Keeler on 07/14/2021 09:10:02 [facility], [place] ([id number]) -------------------------------------------------------------------------------- Progress Note Details Patient Name: [name] Bean Date of Service: 07/14/2021 9:30 AM Medical Record Number: [id number] Patient Account Number: [id number] Date of Birth/Sex: 05/16/1935 (86 y.o. M) Treating RN: Donnamarie Poag Primary Care Provider: Harrel Lemon Other Clinician: Referring Provider: Harrel Lemon Treating  Provider/Extender: Skipper Cliche in Treatment: 8 Subjective Chief Complaint Information obtained from Patient Left LE Ulcer History of Present Illness (HPI) 05/17/2021 upon evaluation today patient presents for initial evaluation here in the clinic concerning issues that he has been healing. This again is not terribly uncommon especially with the significant swelling that he is experiencing. He does not have the compression wraps or compression stockings he tells me has never been told he should wear those but he definitely should be wearing those. Fortunately there does not appear to be any evidence of active infection at this time which is great news systemically though locally I do see some evidence of maceration and there is some question as to whether or  not honestly he may have some infection present. He definitely has some slough and biofilm that needs to be cleared away. Either way I think with appropriate compression we can definitely get things moving in the right direction. Patient does have a history of chronic venous stasis, lymphedema, and his wounds unfortunately are right in the lower extremity on the left where he does have significant swelling and lymphedema. This means this can make things somewhat more difficult to heal in general. Other than this he also has a history of hypertension. 05/24/2021 upon evaluation today patient appears to be doing decently well in regard to his wound. He has been tolerating the dressing changes without complication. Fortunately there does not appear to be any evidence of active infection at this point which is great news and overall I am pleased in that regard. Nonetheless I do believe that the patient is still having quite a bit of discomfort at least when I cleaned the wound but overall I think we are headed in the appropriate direction. No fevers, chills, nausea, vomiting, or diarrhea. 06/09/2021 upon evaluation today patient appears to be  doing well with regard to his wound. He has been tolerating the dressing changes without complication. Fortunately there does not appear to be any signs of active infection which is great news and overall very pleased with where we stand today. Nonetheless there is been made to be some sharp debridement performed today. Also think that the patient probably needs a stronger compression I would recommend that we increase to a 4-layer compression wrap. 06/23/2021 upon evaluation today patient appears to be doing better in regard to his leg ulcer. He has been tolerating the dressing changes without complication and overall I am extremely pleased with where we stand I think he is headed in the appropriate direction and this is healing quite nicely. 06/30/2021 upon evaluation today patient appears to be doing well with regard to his wound. In fact this is measuring significantly smaller and overall seems to be doing great. I am very pleased I am hopeful to get this done soon and he will be out of here. He is definitely ready to get out of the Wrap as this is causing a lot of problems with his knee. 07/07/2021 upon evaluation today patient's wound is actually showing signs of excellent improvement which is great news. Fortunately I think he is headed in the right direction and were getting closer to complete resolution. 07/14/2021 upon evaluation today patient appears to be doing well with regard to his leg ulcers. Fortunately I do not see any signs of active infection which is great news. Overall I think that he is actually doing quite well. Objective Constitutional Well-nourished and well-hydrated in no acute distress. Vitals Time Taken: 9:40 AM, Height: 73 in, Weight: 260 lbs, BMI: 34.3, Temperature: 98.3 F, Pulse: 83 bpm, Respiratory Rate: 18 breaths/min, Blood Pressure: 118/79 mmHg. Respiratory normal breathing without difficulty. Psychiatric this patient is able to make decisions and  demonstrates good insight into disease process. Alert and Oriented x 3. pleasant and cooperative. [name] Bean, [name] Bean ([id number]) General Notes: Patient's wound bed actually showed signs of some need for sharp debridement postdebridement appears to be doing much better which is great news. With that being said the patient unfortunately is mainly having pain in his left knee but states he cannot have anything done surgically which needs to be done until the wounds are healed. Integumentary (Hair, Skin) Wound #1 status is Open. Original cause of wound was Bump.  The date acquired was: 05/03/2021. The wound has been in treatment 8 weeks. The wound is located on the Left,Distal,Medial Lower Leg. The wound measures 0.7cm length x 0.5cm width x 0.1cm depth; 0.275cm^2 area and 0.027cm^3 volume. There is Fat Layer (Subcutaneous Tissue) exposed. There is no tunneling or undermining noted. There is a medium amount of serosanguineous drainage noted. There is large (67-100%) red, pink granulation within the wound bed. There is a small (1-33%) amount of necrotic tissue within the wound bed including Adherent Slough. Assessment Active Problems ICD-10 Venous insufficiency (chronic) (peripheral) Lymphedema, not elsewhere classified Non-pressure chronic ulcer of other part of left lower leg with fat layer exposed [place] (primary) hypertension Procedures Wound #1 Pre-procedure diagnosis of Wound #1 is a Trauma, Other located on the Left,Distal,Medial Lower Leg . There was a Excisional Skin/Subcutaneous Tissue Debridement with a total area of 0.35 sq cm performed by Tommie Sams., PA-C. With the following instrument(s): Curette to remove Viable and Non-Viable tissue/material. Material removed includes Subcutaneous Tissue and Slough and. A time out was conducted at 10:00, prior to the start of the procedure. A Minimum amount of bleeding was controlled with Pressure. The procedure was tolerated well. Post  Debridement Measurements: 0.7cm length x 0.5cm width x 0.2cm depth; 0.055cm^3 volume. Character of Wound/Ulcer Post Debridement is improved. Post procedure Diagnosis Wound #1: Same as Pre-Procedure Pre-procedure diagnosis of Wound #1 is a Trauma, Other located on the Left,Distal,Medial Lower Leg . There was a Four Layer Compression Therapy Procedure by Donnamarie Poag, RN. Post procedure Diagnosis Wound #1: Same as Pre-Procedure Plan Follow-up Appointments: Return Appointment in 1 week. Nurse Visit as needed Bathing/ Shower/ Hygiene: May shower with wound dressing protected with water repellent cover or cast protector. No tub bath. Edema Control - Lymphedema / Segmental Compressive Device / Other: Optional: One layer of unna paste to top of compression wrap (to act as an anchor). Patient to wear own compression stockings. Remove compression stockings every night before going to bed and put on every morning when getting up. - right leg Elevate legs to the level of the heart and pump ankles as often as possible Elevate leg(s) parallel to the floor when sitting. DO YOUR BEST to sleep in the bed at night. DO NOT sleep in your recliner. Long hours of sitting in a recliner leads to swelling of the legs and/or potential wounds on your backside. Additional Orders / Instructions: Follow Nutritious Diet and Increase Protein Intake WOUND #1: - Lower Leg Wound Laterality: Left, Medial, Distal Cleanser: Soap and Water 1 x Per Week/30 Days Discharge Instructions: Gently cleanse wound with antibacterial soap, rinse and pat dry prior to dressing wounds Topical: thera derm moisturizing lotion to peri wound lower leg 1 x Per Week/30 Days Primary Dressing: Prisma 4.34 (in) 1 x Per Week/30 Days Discharge Instructions: Moisten w/normal saline or sterile water; Cover wound as directed. Do not remove from wound bed. [name] Bean, [name] Bean ([id number]) Secondary Dressing: Gauze 1 x Per Week/30 Days Discharge Instructions:  As directed: dry, moistened with saline or moistened with Dakins Solution Compression Wrap: Medichoice 4 layer Compression System, 35-40 mmHG 1 x Per Week/30 Days Discharge Instructions: Apply multi-layer wrap as directed. 1. Would recommend currently that we going to continue with the silver collagen dressing which I think is doing a good job. 2. I am also can recommend that we have the patient continue with the 4-layer compression wrap which is keeping the edema under good control. We will see patient back for reevaluation in  1 week here in the clinic. If anything worsens or changes patient will contact our office for additional recommendations. Electronic Signature(s) Signed: 07/14/2021 5:07:49 PM By: Worthy Keeler PA-C Entered By: Worthy Keeler on 07/14/2021 17:07:48 [facility], [place] ([id number]) -------------------------------------------------------------------------------- SuperBill Details Patient Name: [name] Bean Date of Service: 07/14/2021 Medical Record Number: [id number] Patient Account Number: [id number] Date of Birth/Sex: 06-15-1935 (86 y.o. M) Treating RN: Donnamarie Poag Primary Care Provider: Harrel Lemon Other Clinician: Referring Provider: Harrel Lemon Treating Provider/Extender: Skipper Cliche in Treatment: 8 Diagnosis Coding ICD-10 Codes Code Description I87.2 Venous insufficiency (chronic) (peripheral) I89.0 Lymphedema, not elsewhere classified L97.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 layer exposed [place] (primary) hypertension Facility Procedures CPT4 Code: [id number] Description: 01720 - DEB SUBQ TISSUE 20 SQ CM/< Modifier: Quantity: 1 CPT4 Code: Description: ICD-10 Diagnosis Description L97.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 layer Modifier: exposed Quantity: Physician Procedures CPT4 Code: [id number] Description: 61969 - WC PHYS SUBQ TISS 20 SQ CM Modifier: Quantity: 1 CPT4  Code: Description: ICD-10 Diagnosis Description L97.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 layer Modifier: exposed Quantity: Electronic Signature(s) Signed: 07/14/2021 5:09:24 PM By: Worthy Keeler PA-C Previous Signature: 07/14/2021 4:07:12 PM Version By: Donnamarie Poag Entered By: Worthy Keeler on 07/14/2021 17:09:24

## 2021-07-21 ENCOUNTER — Encounter: Payer: Medicare Other | Admitting: Physician Assistant

## 2021-07-21 ENCOUNTER — Other Ambulatory Visit: Payer: Self-pay

## 2021-07-21 DIAGNOSIS — I872 Venous insufficiency (chronic) (peripheral): Secondary | ICD-10-CM | POA: Diagnosis not present

## 2021-07-21 NOTE — Progress Notes (Addendum)
CARLITO, BOGERT ([id number]) Visit Report for 07/21/2021 Chief Complaint Document Details Patient Name: [name] Bean, [name] Bean Date of Service: 07/21/2021 9:30 AM Medical Record Number: [id number] Patient Account Number: [id number] Date of Birth/Sex: 1934-09-12 (86 y.o. M) Treating RN: Donnamarie Poag Primary Care Provider: Harrel Lemon Other Clinician: Referring Provider: Harrel Lemon Treating Provider/Extender: Skipper Cliche in Treatment: 9 Information Obtained from: Patient Chief Complaint Bilateral LE Ulcers Electronic Signature(s) Signed: 07/21/2021 10:13:13 AM By: Worthy Keeler PA-C Previous Signature: 07/21/2021 9:26:48 AM Version By: Worthy Keeler PA-C Entered By: Worthy Keeler on 07/21/2021 10:13:12 [facility], [place] ([id number]) -------------------------------------------------------------------------------- Debridement Details Patient Name: [name] Bean Date of Service: 07/21/2021 9:30 AM Medical Record Number: [id number] Patient Account Number: [id number] Date of Birth/Sex: 1934-09-29 (86 y.o. M) Treating RN: Donnamarie Poag Primary Care Provider: Harrel Lemon Other Clinician: Referring Provider: Harrel Lemon Treating Provider/Extender: Skipper Cliche in Treatment: 9 Debridement Performed for Wound #1 Left,Distal,Medial Lower Leg Assessment: Performed By: Physician Tommie Sams., PA-C Debridement Type: Debridement Level of Consciousness (Pre- Awake and Alert procedure): Pre-procedure Verification/Time Out Yes - 10:06 Taken: Start Time: 10:06 Pain Control: Lidocaine Total Area Debrided (L x W): 0.4 (cm) x 0.2 (cm) = 0.08 (cm) Tissue and other material Viable, Non-Viable, Slough, Subcutaneous, Slough debrided: Level: Skin/Subcutaneous Tissue Debridement Description: Excisional Instrument: Curette Bleeding: Minimum Hemostasis Achieved: Pressure Response to Treatment: Procedure was tolerated well Level of Consciousness (Post- Awake and Alert procedure): Post  Debridement Measurements of Total Wound Length: (cm) 0.4 Width: (cm) 0.2 Depth: (cm) 0.1 Volume: (cm) 0.006 Character of Wound/Ulcer Post Debridement: Improved Post Procedure Diagnosis Same as Pre-procedure Electronic Signature(s) Signed: 07/21/2021 12:33:32 PM By: Donnamarie Poag Signed: 07/21/2021 5:41:09 PM By: Worthy Keeler PA-C Entered By: Donnamarie Poag on 07/21/2021 10:08:16 [name] Bean ([id number]) -------------------------------------------------------------------------------- HPI Details Patient Name: [name] Bean Date of Service: 07/21/2021 9:30 AM Medical Record Number: [id number] Patient Account Number: [id number] Date of Birth/Sex: 12-Nov-1934 (86 y.o. M) Treating RN: Donnamarie Poag Primary Care Provider: Harrel Lemon Other Clinician: Referring Provider: Harrel Lemon Treating Provider/Extender: Skipper Cliche in Treatment: 9 History of Present Illness HPI Description: 05/17/2021 upon evaluation today patient presents for initial evaluation here in the clinic concerning issues that he has been healing. This again is not terribly uncommon especially with the significant swelling that he is experiencing. He does not have the compression wraps or compression stockings he tells me has never been told he should wear those but he definitely should be wearing those. Fortunately there does not appear to be any evidence of active infection at this time which is great news systemically though locally I do see some evidence of maceration and there is some question as to whether or not honestly he may have some infection present. He definitely has some slough and biofilm that needs to be cleared away. Either way I think with appropriate compression we can definitely get things moving in the right direction. Patient does have a history of chronic venous stasis, lymphedema, and his wounds unfortunately are right in the lower extremity on the left where he does have significant swelling and  lymphedema. This means this can make things somewhat more difficult to heal in general. Other than this he also has a history of hypertension. 05/24/2021 upon evaluation today patient appears to be doing decently well in regard to his wound. He has been tolerating the dressing changes without complication. Fortunately there does not appear to be any evidence of active infection at this point which is great news and overall  I am pleased in that regard. Nonetheless I do believe that the patient is still having quite a bit of discomfort at least when I cleaned the wound but overall I think we are headed in the appropriate direction. No fevers, chills, nausea, vomiting, or diarrhea. 06/09/2021 upon evaluation today patient appears to be doing well with regard to his wound. He has been tolerating the dressing changes without complication. Fortunately there does not appear to be any signs of active infection which is great news and overall very pleased with where we stand today. Nonetheless there is been made to be some sharp debridement performed today. Also think that the patient probably needs a stronger compression I would recommend that we increase to a 4-layer compression wrap. 06/23/2021 upon evaluation today patient appears to be doing better in regard to his leg ulcer. He has been tolerating the dressing changes without complication and overall I am extremely pleased with where we stand I think he is headed in the appropriate direction and this is healing quite nicely. 06/30/2021 upon evaluation today patient appears to be doing well with regard to his wound. In fact this is measuring significantly smaller and overall seems to be doing great. I am very pleased I am hopeful to get this done soon and he will be out of here. He is definitely ready to get out of the Wrap as this is causing a lot of problems with his knee. 07/07/2021 upon evaluation today patient's wound is actually showing signs of  excellent improvement which is great news. Fortunately I think he is headed in the right direction and were getting closer to complete resolution. 07/14/2021 upon evaluation today patient appears to be doing well with regard to his leg ulcers. Fortunately I do not see any signs of active infection which is great news. Overall I think that he is actually doing quite well. 07/21/2021 upon evaluation today patient appears to be doing well with regard to his leg ulcers. He is showing signs of improvement though this is slow to heal but still is making progress. Fortunately there is no signs of active infection systemically which is great news. No fevers, chills, nausea, vomiting, or diarrhea. Electronic Signature(s) Signed: 07/21/2021 10:11:53 AM By: Worthy Keeler PA-C Entered By: Worthy Keeler on 07/21/2021 10:11:52 [facility], [place] ([id number]) -------------------------------------------------------------------------------- Physical Exam Details Patient Name: [name] Bean Date of Service: 07/21/2021 9:30 AM Medical Record Number: [id number] Patient Account Number: [id number] Date of Birth/Sex: [birth year] (86 y.o. M) Treating RN: Donnamarie Poag Primary Care Provider: Harrel Lemon Other Clinician: Referring Provider: Harrel Lemon Treating Provider/Extender: Skipper Cliche in Treatment: 49 Constitutional Well-nourished and well-hydrated in no acute distress. Respiratory normal breathing without difficulty. Psychiatric this patient is able to make decisions and demonstrates good insight into disease process. Alert and Oriented x 3. pleasant and cooperative. Notes Upon inspection patient's wound bed showed signs of good granulation and epithelization at this point. Fortunately I do not see any signs of infection currently which is great news and overall I am extremely happy with where we stand today. No fevers, chills, nausea, vomiting, or diarrhea. Electronic Signature(s) Signed:  07/21/2021 10:12:12 AM By: Worthy Keeler PA-C Entered By: Worthy Keeler on 07/21/2021 10:12:12 [name] Bean ([id number]) -------------------------------------------------------------------------------- Physician Orders Details Patient Name: [name] Bean Date of Service: 07/21/2021 9:30 AM Medical Record Number: [id number] Patient Account Number: [id number] Date of Birth/Sex: 06/12/1935 (86 y.o. M) Treating RN: Donnamarie Poag Primary Care Provider: Harrel Lemon Other Clinician: Referring Provider: Harrel Lemon  Treating Provider/Extender: Skipper Cliche in Treatment: 9 Verbal / Phone Orders: No Diagnosis Coding ICD-10 Coding Code Description I87.2 Venous insufficiency (chronic) (peripheral) I89.0 Lymphedema, not elsewhere classified L97.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 layer exposed [place] (primary) hypertension Follow-up Appointments o Return Appointment in 1 week. o Nurse Visit as needed Bathing/ Shower/ Hygiene o May shower with wound dressing protected with water repellent cover or cast protector. o No tub bath. Edema Control - Lymphedema / Segmental Compressive Device / Other o Optional: One layer of unna paste to top of compression wrap (to act as an anchor). o Elevate legs to the level of the heart and pump ankles as often as possible o Elevate leg(s) parallel to the floor when sitting. o DO YOUR BEST to sleep in the bed at night. DO NOT sleep in your recliner. Long hours of sitting in a recliner leads to swelling of the legs and/or potential wounds on your backside. Additional Orders / Instructions o Follow Nutritious Diet and Increase Protein Intake Wound Treatment Wound #1 - Lower Leg Wound Laterality: Left, Medial, Distal Cleanser: Soap and Water 1 x Per Week/30 Days Discharge Instructions: Gently cleanse wound with antibacterial soap, rinse and pat dry prior to dressing wounds Topical: thera derm  moisturizing lotion to peri wound lower leg 1 x Per Week/30 Days Primary Dressing: Prisma 4.34 (in) 1 x Per Week/30 Days Discharge Instructions: Moisten w/normal saline or sterile water; Cover wound as directed. Do not remove from wound bed. Secondary Dressing: ABD Pad 5x9 (in/in) 1 x Per Week/30 Days Discharge Instructions: Cover with ABD pad Secondary Dressing: Gauze 1 x Per Week/30 Days Discharge Instructions: As directed: dry, moistened with saline or moistened with Dakins Solution Compression Wrap: Medichoice 4 layer Compression System, 35-40 mmHG 1 x Per Week/30 Days Discharge Instructions: Apply multi-layer wrap as directed. Wound #2 - Lower Leg Wound Laterality: Right Cleanser: Soap and Water 1 x Per Week/30 Days Discharge Instructions: Gently cleanse wound with antibacterial soap, rinse and pat dry prior to dressing wounds Topical: thera derm moisturizing lotion to peri wound lower leg 1 x Per Week/30 Days Primary Dressing: Prisma 4.34 (in) 1 x Per Week/30 Days Discharge Instructions: Moisten w/normal saline or sterile water; Cover wound as directed. Do not remove from wound bed. DENE, NAZIR ([id number]) Secondary Dressing: ABD Pad 5x9 (in/in) 1 x Per Week/30 Days Discharge Instructions: Cover with ABD pad Secondary Dressing: Gauze 1 x Per Week/30 Days Discharge Instructions: As directed: dry, moistened with saline or moistened with Dakins Solution Compression Wrap: Medichoice 4 layer Compression System, 35-40 mmHG 1 x Per Week/30 Days Discharge Instructions: Apply multi-layer wrap as directed. Electronic Signature(s) Signed: 07/21/2021 12:33:32 PM By: Donnamarie Poag Signed: 07/21/2021 5:41:09 PM By: Worthy Keeler PA-C Entered By: Donnamarie Poag on 07/21/2021 10:11:32 [name] Bean ([id number]) -------------------------------------------------------------------------------- Problem List Details Patient Name: [name] Bean Date of Service: 07/21/2021 9:30 AM Medical Record Number:  [id number] Patient Account Number: [id number] Date of Birth/Sex: [birth year] (86 y.o. M) Treating RN: Donnamarie Poag Primary Care Provider: Harrel Lemon Other Clinician: Referring Provider: Harrel Lemon Treating Provider/Extender: Skipper Cliche in Treatment: 9 Active Problems ICD-10 Encounter Code Description Active Date MDM Diagnosis I87.2 Venous insufficiency (chronic) (peripheral) 05/17/2021 No Yes I89.0 Lymphedema, not elsewhere classified 05/17/2021 No Yes L97.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 layer 05/17/2021 No Yes exposed L97.812 Non-pressure chronic ulcer of other part of right lower leg with fat layer 07/21/2021  No Yes exposed [place] (primary) hypertension 05/17/2021 No Yes Inactive Problems Resolved Problems Electronic Signature(s) Signed: 07/21/2021 10:12:56 AM By: Worthy Keeler PA-C Previous Signature: 07/21/2021 9:24:41 AM Version By: Worthy Keeler PA-C Previous Signature: 07/21/2021 9:11:46 AM Version By: Worthy Keeler PA-C Entered By: Worthy Keeler on 07/21/2021 10:12:55 [facility], [place] ([id number]) -------------------------------------------------------------------------------- Progress Note Details Patient Name: [name] Bean Date of Service: 07/21/2021 9:30 AM Medical Record Number: [id number] Patient Account Number: [id number] Date of Birth/Sex: [birth year] (86 y.o. M) Treating RN: Donnamarie Poag Primary Care Provider: Harrel Lemon Other Clinician: Referring Provider: Harrel Lemon Treating Provider/Extender: Skipper Cliche in Treatment: 9 Subjective Chief Complaint Information obtained from Patient Bilateral LE Ulcers History of Present Illness (HPI) 05/17/2021 upon evaluation today patient presents for initial evaluation here in the clinic concerning issues that he has been healing. This again is not terribly uncommon especially with the significant swelling that he is experiencing. He does not have the  compression wraps or compression stockings he tells me has never been told he should wear those but he definitely should be wearing those. Fortunately there does not appear to be any evidence of active infection at this time which is great news systemically though locally I do see some evidence of maceration and there is some question as to whether or not honestly he may have some infection present. He definitely has some slough and biofilm that needs to be cleared away. Either way I think with appropriate compression we can definitely get things moving in the right direction. Patient does have a history of chronic venous stasis, lymphedema, and his wounds unfortunately are right in the lower extremity on the left where he does have significant swelling and lymphedema. This means this can make things somewhat more difficult to heal in general. Other than this he also has a history of hypertension. 05/24/2021 upon evaluation today patient appears to be doing decently well in regard to his wound. He has been tolerating the dressing changes without complication. Fortunately there does not appear to be any evidence of active infection at this point which is great news and overall I am pleased in that regard. Nonetheless I do believe that the patient is still having quite a bit of discomfort at least when I cleaned the wound but overall I think we are headed in the appropriate direction. No fevers, chills, nausea, vomiting, or diarrhea. 06/09/2021 upon evaluation today patient appears to be doing well with regard to his wound. He has been tolerating the dressing changes without complication. Fortunately there does not appear to be any signs of active infection which is great news and overall very pleased with where we stand today. Nonetheless there is been made to be some sharp debridement performed today. Also think that the patient probably needs a stronger compression I would recommend that we increase to  a 4-layer compression wrap. 06/23/2021 upon evaluation today patient appears to be doing better in regard to his leg ulcer. He has been tolerating the dressing changes without complication and overall I am extremely pleased with where we stand I think he is headed in the appropriate direction and this is healing quite nicely. 06/30/2021 upon evaluation today patient appears to be doing well with regard to his wound. In fact this is measuring significantly smaller and overall seems to be doing great. I am very pleased I am hopeful to get this done soon and he will be out of here. He is definitely ready to get out of  the Wrap as this is causing a lot of problems with his knee. 07/07/2021 upon evaluation today patient's wound is actually showing signs of excellent improvement which is great news. Fortunately I think he is headed in the right direction and were getting closer to complete resolution. 07/14/2021 upon evaluation today patient appears to be doing well with regard to his leg ulcers. Fortunately I do not see any signs of active infection which is great news. Overall I think that he is actually doing quite well. 07/21/2021 upon evaluation today patient appears to be doing well with regard to his leg ulcers. He is showing signs of improvement though this is slow to heal but still is making progress. Fortunately there is no signs of active infection systemically which is great news. No fevers, chills, nausea, vomiting, or diarrhea. Objective Constitutional Well-nourished and well-hydrated in no acute distress. Vitals Time Taken: 9:35 AM, Height: 73 in, Weight: 260 lbs, BMI: 34.3, Temperature: 98.3 F, Pulse: 82 bpm, Respiratory Rate: 18 breaths/min, Blood Pressure: 109/75 mmHg. Respiratory normal breathing without difficulty. [facility], [place] ([id number]) Psychiatric this patient is able to make decisions and demonstrates good insight into disease process. Alert and Oriented x 3. pleasant and  cooperative. General Notes: Upon inspection patient's wound bed showed signs of good granulation and epithelization at this point. Fortunately I do not see any signs of infection currently which is great news and overall I am extremely happy with where we stand today. No fevers, chills, nausea, vomiting, or diarrhea. Integumentary (Hair, Skin) Wound #1 status is Open. Original cause of wound was Bump. The date acquired was: 05/03/2021. The wound has been in treatment 9 weeks. The wound is located on the Left,Distal,Medial Lower Leg. The wound measures 0.4cm length x 0.2cm width x 0.1cm depth; 0.063cm^2 area and 0.006cm^3 volume. There is Fat Layer (Subcutaneous Tissue) exposed. There is no tunneling or undermining noted. There is a medium amount of serosanguineous drainage noted. There is large (67-100%) red, pink granulation within the wound bed. There is a small (1-33%) amount of necrotic tissue within the wound bed including Adherent Slough. Wound #2 status is Open. Original cause of wound was Gradually Appeared. The date acquired was: 07/21/2021. The wound is located on the Right Lower Leg. The wound measures 1.4cm length x 0.5cm width x 0.1cm depth; 0.55cm^2 area and 0.055cm^3 volume. There is no tunneling or undermining noted. There is a medium amount of serosanguineous drainage noted. There is large (67-100%) red, pink granulation within the wound bed. There is a small (1-33%) amount of necrotic tissue within the wound bed including Adherent Slough. Other Condition(s) Patient presents with Lymphedema located on the Right Leg. Assessment Active Problems ICD-10 Venous insufficiency (chronic) (peripheral) Lymphedema, not elsewhere classified Non-pressure chronic ulcer of other part of left lower leg with fat layer exposed Non-pressure chronic ulcer of other part of right lower leg with fat layer exposed [place] (primary) hypertension Procedures Wound #1 Pre-procedure diagnosis of Wound  #1 is a Trauma, Other located on the Left,Distal,Medial Lower Leg . There was a Excisional Skin/Subcutaneous Tissue Debridement with a total area of 0.08 sq cm performed by Tommie Sams., PA-C. With the following instrument(s): Curette to remove Viable and Non-Viable tissue/material. Material removed includes Subcutaneous Tissue and Slough and after achieving pain control using Lidocaine. A time out was conducted at 10:06, prior to the start of the procedure. A Minimum amount of bleeding was controlled with Pressure. The procedure was tolerated well. Post Debridement Measurements: 0.4cm length x 0.2cm width  x 0.1cm depth; 0.006cm^3 volume. Character of Wound/Ulcer Post Debridement is improved. Post procedure Diagnosis Wound #1: Same as Pre-Procedure Pre-procedure diagnosis of Wound #1 is a Trauma, Other located on the Left,Distal,Medial Lower Leg . There was a Four Layer Compression Therapy Procedure by Donnamarie Poag, RN. Post procedure Diagnosis Wound #1: Same as Pre-Procedure Plan Follow-up Appointments: Return Appointment in 1 week. Nurse Visit as needed Bathing/ Shower/ Hygiene: May shower with wound dressing protected with water repellent cover or cast protector. No tub bath. Edema Control - Lymphedema / Segmental Compressive Device / Other: Optional: One layer of unna paste to top of compression wrap (to act as an anchor). Elevate legs to the level of the heart and pump ankles as often as possible Giel, Petar ([id number]) Elevate leg(s) parallel to the floor when sitting. DO YOUR BEST to sleep in the bed at night. DO NOT sleep in your recliner. Long hours of sitting in a recliner leads to swelling of the legs and/or potential wounds on your backside. Additional Orders / Instructions: Follow Nutritious Diet and Increase Protein Intake WOUND #1: - Lower Leg Wound Laterality: Left, Medial, Distal Cleanser: Soap and Water 1 x Per Week/30 Days Discharge Instructions: Gently cleanse wound  with antibacterial soap, rinse and pat dry prior to dressing wounds Topical: thera derm moisturizing lotion to peri wound lower leg 1 x Per Week/30 Days Primary Dressing: Prisma 4.34 (in) 1 x Per Week/30 Days Discharge Instructions: Moisten w/normal saline or sterile water; Cover wound as directed. Do not remove from wound bed. Secondary Dressing: ABD Pad 5x9 (in/in) 1 x Per Week/30 Days Discharge Instructions: Cover with ABD pad Secondary Dressing: Gauze 1 x Per Week/30 Days Discharge Instructions: As directed: dry, moistened with saline or moistened with Dakins Solution Compression Wrap: Medichoice 4 layer Compression System, 35-40 mmHG 1 x Per Week/30 Days Discharge Instructions: Apply multi-layer wrap as directed. WOUND #2: - Lower Leg Wound Laterality: Right Cleanser: Soap and Water 1 x Per Week/30 Days Discharge Instructions: Gently cleanse wound with antibacterial soap, rinse and pat dry prior to dressing wounds Topical: thera derm moisturizing lotion to peri wound lower leg 1 x Per Week/30 Days Primary Dressing: Prisma 4.34 (in) 1 x Per Week/30 Days Discharge Instructions: Moisten w/normal saline or sterile water; Cover wound as directed. Do not remove from wound bed. Secondary Dressing: ABD Pad 5x9 (in/in) 1 x Per Week/30 Days Discharge Instructions: Cover with ABD pad Secondary Dressing: Gauze 1 x Per Week/30 Days Discharge Instructions: As directed: dry, moistened with saline or moistened with Dakins Solution Compression Wrap: Medichoice 4 layer Compression System, 35-40 mmHG 1 x Per Week/30 Days Discharge Instructions: Apply multi-layer wrap as directed. 1. I would recommend that we continue with the wound care measures as before and the patient is in agreement with the plan. I do think that the 1 changes we can add wrapping his right leg as well as her does appear to be a new area that something on this right side and again this is more of a blister that popped up and now it is  getting in trouble with his legs are quite swollen. Nonetheless I do think that the compression is doing a good job and I Georgina Peer recommend that we going to see about compression wrap of the right leg as well currently 2. I am also can recommend at this time that we have the patient continue to monitor for any signs of worsening or infection. Obviously right now I do not see any  evidence at all of infection and I am just hopeful to try to get these wounds completely done shortly. It is difficult to know exactly how long that is going to take. We will see patient back for reevaluation in 1 week here in the clinic. If anything worsens or changes patient will contact our office for additional recommendations. Electronic Signature(s) Signed: 07/21/2021 10:13:40 AM By: Worthy Keeler PA-C Entered By: Worthy Keeler on 07/21/2021 10:13:40 [facility], [place] ([id number]) -------------------------------------------------------------------------------- SuperBill Details Patient Name: [name] Bean Date of Service: 07/21/2021 Medical Record Number: [id number] Patient Account Number: [id number] Date of Birth/Sex: Mar 28, 1935 (86 y.o. M) Treating RN: Donnamarie Poag Primary Care Provider: Harrel Lemon Other Clinician: Referring Provider: Harrel Lemon Treating Provider/Extender: Skipper Cliche in Treatment: 9 Diagnosis Coding ICD-10 Codes Code Description I87.2 Venous insufficiency (chronic) (peripheral) I89.0 Lymphedema, not elsewhere classified L97.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 layer exposed [place] (primary) hypertension Facility Procedures CPT4 Code: 37858850 Description: 27741 - DEB SUBQ TISSUE 20 SQ CM/< Modifier: Quantity: 1 CPT4 Code: Description: ICD-10 Diagnosis Description L97.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 layer Modifier: exposed Quantity: Physician Procedures CPT4 Code: 2878676 Description: 72094  - WC PHYS LEVEL 4 - EST PT Modifier: 25 Quantity: 1 CPT4 Code: Description: ICD-10 Diagnosis Description I87.2 Venous insufficiency (chronic) (peripheral) I89.0 Lymphedema, not elsewhere classified L97.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 layer I10 Essential (primary) hypertension Modifier: exposed Quantity: CPT4 Code: 7096283 Description: 11042 - WC PHYS SUBQ TISS 20 SQ CM Modifier: Quantity: 1 CPT4 Code: Description: ICD-10 Diagnosis Description L97.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 layer Modifier: exposed Quantity: Electronic Signature(s) Signed: 07/21/2021 10:14:16 AM By: Worthy Keeler PA-C Entered By: Worthy Keeler on 07/21/2021 10:14:15

## 2021-07-21 NOTE — Progress Notes (Addendum)
[name], [name] ([id number]) Visit Report for 07/21/2021 Arrival Information Details Patient Name: [name], Bean Date of Service: 07/21/2021 9:30 AM Medical Record Number: [id number] Patient Account Number: [id number] Date of Birth/Sex: 09/16/1934 (86 y.o. M) Treating RN: Donnamarie Poag Primary Care Jackolyn Geron: Harrel Lemon Other Clinician: Referring Zayveon Raschke: Harrel Lemon Treating Orion Mole/Extender: Skipper Cliche in Treatment: 9 Visit Information History Since Last Visit Added or deleted any medications: No Patient Arrived: [name] Had a fall or experienced change in No Arrival Time: 09:34 activities of daily living that may affect Accompanied By: self risk of falls: Transfer Assistance: None Hospitalized since last visit: No Patient Identification Verified: Yes Has Dressing in Place as Prescribed: Yes Secondary Verification Process Completed: Yes Has Compression in Place as Prescribed: Yes Patient Has Alerts: Yes Pain Present Now: Yes Patient Alerts: Patient on Blood Thinner Aspirin 48m Electronic Signature(s) Signed: 07/21/2021 12:33:32 PM By: BDonnamarie PoagEntered By: BDonnamarie Poagon 07/21/2021 09:34:[address] [facility] [facility]([id number] -------------------------------------------------------------------------------- Clinic Level of Care Assessment Details Patient Name: [name] MarkerDate of Service: 07/21/2021 9:30 AM Medical Record Number: [id number] Account Number: [id number] of Birth/Sex: 4Nov 22, 1936(86 y.o. M) Treating RN: BDonnamarie PoagPrimary Care Rowynn Mcweeney: JHarrel LemonOther Clinician: Referring Aeric Burnham: JHarrel LemonTreating Demira Gwynne/Extender: SSkipper Clichein Treatment: 9 Clinic Level of Care Assessment Items TOOL 1 Quantity Score _0  - Use when EandM and Procedure is performed on INITIAL visit 0 ASSESSMENTS - Nursing Assessment / Reassessment _1  - General Physical Exam (combine w/ comprehensive assessment (listed just below) when performed on new 0 pt.  evals) _2  - 0 Comprehensive Assessment (HX, ROS, Risk Assessments, Wounds Hx, etc.) ASSESSMENTS - Wound and Skin Assessment / Reassessment _3  - Dermatologic / Skin Assessment (not related to wound area) 0 ASSESSMENTS - Ostomy and/or Continence Assessment and Care _4  - Incontinence Assessment and Management 0 _5  - 0 Ostomy Care Assessment and Management (repouching, etc.) PROCESS - Coordination of Care _6  - Simple Patient / Family Education for ongoing care 0 _7  - 0 Complex (extensive) Patient / Family Education for ongoing care _8  - 0 Staff obtains CProgrammer, systems Records, Test Results / Process Orders _9  - 0 Staff telephones HHA, Nursing Homes / Clarify orders / etc _10  - 0 Routine Transfer to another Facility (non-emergent condition) _11  - 0 Routine Hospital Admission (non-emergent condition) _12  - 0 New Admissions / IBiomedical engineer/ Ordering NPWT, Apligraf, etc. _13  - 0 Emergency Hospital Admission (emergent condition) PROCESS - Special Needs _14  - Pediatric / Minor Patient Management 0 _15  - 0 Isolation Patient Management _16  - 0 Hearing / Language / Visual special needs _17  - 0 Assessment of Community assistance (transportation, D/C planning, etc.) _18  - 0 Additional assistance / Altered mentation _19  - 0 Support Surface(s) Assessment (bed, cushion, seat, etc.) INTERVENTIONS - Miscellaneous _20  - External ear exam 0 _21  - 0 Patient Transfer (multiple staff / HCivil Service fast streamer/ Similar devices) _22  - 0 Simple Staple / Suture removal (25 or less) _23  - 0 Complex Staple / Suture removal (26 or more) _24  - 0 Hypo/Hyperglycemic Management (do not check if billed separately) _25  - 0 Ankle / Brachial Index (ABI) - do not check if billed separately Has the patient been seen at the hospital within the last three years: Yes Total Score: 0 Level Of Care: ____ [name] Bean([id number] Electronic Signature(s) Signed: 07/21/2021 12:33:32 PM By: BDonnamarie PoagEntered By: BDonnamarie Poagon  07/21/2021 10:11:40 [name] Bean([id number] -------------------------------------------------------------------------------- Compression Therapy Details Patient Name: [name] MarkerDate of Service: 07/21/2021 9:30  AM Medical Record Number: [id number] Patient Account Number: [id number] Date of Birth/Sex: 29-Jun-1935 (86 y.o. M) Treating RN: Donnamarie Poag Primary Care Shirla Hodgkiss: Harrel Lemon Other Clinician: Referring Adaria Hole: Harrel Lemon Treating Tiasha Helvie/Extender: Skipper Cliche in Treatment: 9 Compression Therapy Performed for Wound Assessment: Wound #1 Left,Distal,Medial Lower Leg Performed By: Junius Argyle, RN Compression Type: Four Layer Post Procedure Diagnosis Same as Pre-procedure Electronic Signature(s) Signed: 07/21/2021 12:33:32 PM By: Donnamarie Poag Entered By: Donnamarie Poag on 07/21/2021 10:06:49 [name] Bean ([id number]) -------------------------------------------------------------------------------- Encounter Discharge Information Details Patient Name: [name] Bean Date of Service: 07/21/2021 9:30 AM Medical Record Number: [id number] Patient Account Number: [id number] Date of Birth/Sex: 12-Jul-1935 (86 y.o. M) Treating RN: Donnamarie Poag Primary Care Nicoli Nardozzi: Harrel Lemon Other Clinician: Referring Tysin Salada: Harrel Lemon Treating Jacqeline Broers/Extender: Skipper Cliche in Treatment: 9 Encounter Discharge Information Items Post Procedure Vitals Discharge Condition: Stable Temperature (F): 98.3 Ambulatory Status: Walker Pulse (bpm): 82 Discharge Destination: Home Respiratory Rate (breaths/min): 16 Transportation: Private Auto Blood Pressure (mmHg): 109/75 Accompanied By: self Schedule Follow-up Appointment: Yes Clinical Summary of Care: Electronic Signature(s) Signed: 07/21/2021 12:33:32 PM By: Donnamarie Poag Entered By: Donnamarie Poag on 07/21/2021 10:29:57 [name] Bean  ([id number]) -------------------------------------------------------------------------------- Lower Extremity Assessment Details Patient Name: [name] Bean Date of Service: 07/21/2021 9:30 AM Medical Record Number: [id number] Patient Account Number: [id number] Date of Birth/Sex: 01/14/1935 (86 y.o. M) Treating RN: Donnamarie Poag Primary Care Zeven Kocak: Harrel Lemon Other Clinician: Referring Jenetta Wease: Harrel Lemon Treating Marlaine Arey/Extender: Jeri Cos Weeks in Treatment: 9 Edema Assessment Assessed: [Left: Yes] [Right: Yes] Edema: [Left: Yes] [Right: Yes] Calf Left: Right: Point of Measurement: 40 cm From Medial Instep 42 cm Ankle Left: Right: Point of Measurement: 14 cm From Medial Instep 28 cm Knee To Floor Left: Right: From Medial Instep 47 cm Vascular Assessment Pulses: Dorsalis Pedis Palpable: [Left:Yes] Electronic Signature(s) Signed: 07/21/2021 12:33:32 PM By: Donnamarie Poag Entered By: Donnamarie Poag on 07/21/2021 09:47:38 [place], [name] ([id number]) -------------------------------------------------------------------------------- Multi Wound Chart Details Patient Name: [name] Bean Date of Service: 07/21/2021 9:30 AM Medical Record Number: [id number] Patient Account Number: [id number] Date of Birth/Sex: [birth year] (86 y.o. M) Treating RN: Donnamarie Poag Primary Care Kelson Queenan: Harrel Lemon Other Clinician: Referring Nancyjo Givhan: Harrel Lemon Treating Quintavius Niebuhr/Extender: Skipper Cliche in Treatment: 9 Vital Signs Height(in): 73 Pulse(bpm): 31 Weight(lbs): 260 Blood Pressure(mmHg): 109/75 Body Mass Index(BMI): 34 Temperature(F): 98.3 Respiratory Rate(breaths/min): 18 Photos: [N/A:N/A] Wound Location: Left, Distal, Medial Lower Leg N/A N/A Wounding Event: Bump N/A N/A Primary Etiology: Trauma, Other N/A N/A Secondary Etiology: Lymphedema N/A N/A Comorbid History: Cataracts, Anemia, Asthma, Sleep N/A N/A Apnea, Coronary Artery Disease, Osteoarthritis Date Acquired:  05/03/2021 N/A N/A Weeks of Treatment: 9 N/A N/A Wound Status: Open N/A N/A Clustered Wound: Yes N/A N/A Measurements L x W x D (cm) 0.4x0.2x0.1 N/A N/A Area (cm) : 0.063 N/A N/A Volume (cm) : 0.006 N/A N/A % Reduction in Area: 99.20% N/A N/A % Reduction in Volume: 99.70% N/A N/A Classification: Full Thickness Without Exposed N/A N/A Support Structures Exudate Amount: Medium N/A N/A Exudate Type: Serosanguineous N/A N/A Exudate Color: red, brown N/A N/A Granulation Amount: Large (67-100%) N/A N/A Granulation Quality: Red, Pink N/A N/A Necrotic Amount: Small (1-33%) N/A N/A Exposed Structures: Fat Layer (Subcutaneous Tissue): N/A N/A Yes Fascia: No Tendon: No Muscle: No Joint: No Bone: No Epithelialization: Small (1-33%) N/A N/A Treatment Notes Electronic Signature(s) Signed: 07/21/2021 12:33:32 PM By: Donnamarie Poag Entered By: Donnamarie Poag on 07/21/2021 10:06:21 [name] Bean ([id number]) [facility], [name] ([id number]) -------------------------------------------------------------------------------- Multi-Disciplinary Care Plan Details Patient Name: [name] Bean  Date of Service: 07/21/2021 9:30 AM Medical Record Number: [id number] Patient Account Number: [id number] Date of Birth/Sex: Aug 18, 1935 (86 y.o. M) Treating RN: Donnamarie Poag Primary Care Charlett Merkle: Harrel Lemon Other Clinician: Referring Jasmeen Fritsch: Harrel Lemon Treating Elita Dame/Extender: Skipper Cliche in Treatment: 9 Active Inactive Wound/Skin Impairment Nursing Diagnoses: Impaired tissue integrity Knowledge deficit related to smoking impact on wound healing Knowledge deficit related to ulceration/compromised skin integrity Goals: Ulcer/skin breakdown will have a volume reduction of 30% by week 4 Date Initiated: 05/17/2021 Date Inactivated: 06/30/2021 Target Resolution Date: 06/16/2021 Goal Status: Met Ulcer/skin breakdown will have a volume reduction of 50% by week 8 Date Initiated: 05/17/2021 Date Inactivated:  07/14/2021 Target Resolution Date: 07/17/2021 Goal Status: Met Ulcer/skin breakdown will have a volume reduction of 80% by week 12 Date Initiated: 05/17/2021 Target Resolution Date: 08/16/2021 Goal Status: Active Ulcer/skin breakdown will heal within 14 weeks Date Initiated: 05/17/2021 Target Resolution Date: 08/30/2021 Goal Status: Active Interventions: Assess patient/caregiver ability to obtain necessary supplies Assess patient/caregiver ability to perform ulcer/skin care regimen upon admission and as needed Assess ulceration(s) every visit Notes: Electronic Signature(s) Signed: 07/21/2021 12:33:32 PM By: Donnamarie Poag Entered By: Donnamarie Poag on 07/21/2021 09:47:47 [place], [name] ([id number]) -------------------------------------------------------------------------------- Non-Wound Condition Assessment Details Patient Name: [name] Bean Date of Service: 07/21/2021 9:30 AM Medical Record Number: [id number] Patient Account Number: [id number] Date of Birth/Sex: 1935/03/27 (86 y.o. M) Treating RN: Donnamarie Poag Primary Care Burnadette Baskett: Harrel Lemon Other Clinician: Referring Janaysha Depaulo: Harrel Lemon Treating Vester Balthazor/Extender: Jeri Cos Weeks in Treatment: 9 Non-Wound Condition: Condition: Lymphedema Location: Leg Side: Right Photos Electronic Signature(s) Signed: 07/21/2021 12:33:32 PM By: Donnamarie Poag Entered By: Donnamarie Poag on 07/21/2021 09:46:25 [facility], [name] ([id number]) -------------------------------------------------------------------------------- Pain Assessment Details Patient Name: [name] Bean Date of Service: 07/21/2021 9:30 AM Medical Record Number: [id number] Patient Account Number: [id number] Date of Birth/Sex: [birth year] (86 y.o. M) Treating RN: Donnamarie Poag Primary Care Ivet Guerrieri: Harrel Lemon Other Clinician: Referring Kimberli Winne: Harrel Lemon Treating Lamount Bankson/Extender: Skipper Cliche in Treatment: 9 Active Problems Location of Pain Severity and Description  of Pain Patient Has Paino Yes Site Locations Pain Location: Generalized Pain Rate the pain. Current Pain Level: 8 Pain Management and Medication Current Pain Management: Electronic Signature(s) Signed: 07/21/2021 12:33:32 PM By: Donnamarie Poag Entered By: Donnamarie Poag on 07/21/2021 09:44:50 [facility], [name] ([id number]) -------------------------------------------------------------------------------- Patient/Caregiver Education Details Patient Name: [name] Bean Date of Service: 07/21/2021 9:30 AM Medical Record Number: [id number] Patient Account Number: [id number] Date of Birth/Gender: 1935/03/17 (86 y.o. M) Treating RN: Donnamarie Poag Primary Care Physician: Harrel Lemon Other Clinician: Referring Physician: Harrel Lemon Treating Physician/Extender: Skipper Cliche in Treatment: 9 Education Assessment Education Provided To: Patient Education Topics Provided Wound Debridement: Wound/Skin Impairment: Electronic Signature(s) Signed: 07/21/2021 12:33:32 PM By: Donnamarie Poag Entered By: Donnamarie Poag on 07/21/2021 10:12:00 [name] Bean ([id number]) -------------------------------------------------------------------------------- Wound Assessment Details Patient Name: [name] Bean Date of Service: 07/21/2021 9:30 AM Medical Record Number: [id number] Patient Account Number: [id number] Date of Birth/Sex: [birth year] (86 y.o. M) Treating RN: Donnamarie Poag Primary Care Laurielle Selmon: Harrel Lemon Other Clinician: Referring Misty Rago: Harrel Lemon Treating Marietta Sikkema/Extender: Jeri Cos Weeks in Treatment: 9 Wound Status Wound Number: 1 Primary Trauma, Other Etiology: Wound Location: Left, Distal, Medial Lower Leg Secondary Lymphedema Wounding Event: Bump Etiology: Date Acquired: 05/03/2021 Wound Status: Open Weeks Of Treatment: 9 Comorbid Cataracts, Anemia, Asthma, Sleep Apnea, Coronary Clustered Wound: Yes History: Artery Disease, Osteoarthritis Photos Wound Measurements Length: (cm)  0.4 Width: (cm) 0.2 Depth: (cm) 0.1 Area: (cm) 0.063 Volume: (cm) 0.006 % Reduction in Area: 99.2% % Reduction  in Volume: 99.7% Epithelialization: Small (1-33%) Tunneling: No Undermining: No Wound Description Classification: Full Thickness Without Exposed Support Structu Exudate Amount: Medium Exudate Type: Serosanguineous Exudate Color: red, brown res Foul Odor After Cleansing: No Slough/Fibrino Yes Wound Bed Granulation Amount: Large (67-100%) Exposed Structure Granulation Quality: Red, Pink Fascia Exposed: No Necrotic Amount: Small (1-33%) Fat Layer (Subcutaneous Tissue) Exposed: Yes Necrotic Quality: Adherent Slough Tendon Exposed: No Muscle Exposed: No Joint Exposed: No Bone Exposed: No Treatment Notes Wound #1 (Lower Leg) Wound Laterality: Left, Medial, Distal Cleanser Soap and Water Discharge Instruction: Gently cleanse wound with antibacterial soap, rinse and pat dry prior to dressing wounds Peri-Wound Care [facility] ([id number]) Topical thera derm moisturizing lotion to peri wound lower leg Primary Dressing Prisma 4.34 (in) Discharge Instruction: Moisten w/normal saline or sterile water; Cover wound as directed. Do not remove from wound bed. Secondary Dressing ABD Pad 5x9 (in/in) Discharge Instruction: Cover with ABD pad Gauze Discharge Instruction: As directed: dry, moistened with saline or moistened with Dakins Solution Secured With Compression Wrap Medichoice 4 layer Compression System, 35-40 mmHG Discharge Instruction: Apply multi-layer wrap as directed. Compression Stockings Add-Ons Electronic Signature(s) Signed: 07/21/2021 12:33:32 PM By: Donnamarie Poag Entered By: Donnamarie Poag on 07/21/2021 09:45:42 [facility], [name] ([id number]) -------------------------------------------------------------------------------- Wound Assessment Details Patient Name: [name] Bean Date of Service: 07/21/2021 9:30 AM Medical Record Number: [id number] Patient Account  Number: [id number] Date of Birth/Sex: 09/27/1934 (86 y.o. M) Treating RN: Donnamarie Poag Primary Care Naethan Bracewell: Harrel Lemon Other Clinician: Referring Kaisei Gilbo: Harrel Lemon Treating Vietta Bonifield/Extender: Jeri Cos Weeks in Treatment: 9 Wound Status Wound Number: 2 Primary Venous Leg Ulcer Etiology: Wound Location: Right Lower Leg Wound Status: Open Wounding Event: Gradually Appeared Comorbid Cataracts, Anemia, Asthma, Sleep Apnea, Coronary Date Acquired: 07/21/2021 History: Artery Disease, Osteoarthritis Weeks Of Treatment: 0 Clustered Wound: No Photos Wound Measurements Length: (cm) 1.4 Width: (cm) 0.5 Depth: (cm) 0.1 Area: (cm) 0.55 Volume: (cm) 0.055 % Reduction in Area: % Reduction in Volume: Tunneling: No Undermining: No Wound Description Classification: Partial Thickness Exudate Amount: Medium Exudate Type: Serosanguineous Exudate Color: red, brown Foul Odor After Cleansing: No Slough/Fibrino Yes Wound Bed Granulation Amount: Large (67-100%) Granulation Quality: Red, Pink Necrotic Amount: Small (1-33%) Necrotic Quality: Adherent Slough Treatment Notes Wound #2 (Lower Leg) Wound Laterality: Right Cleanser Soap and Water Discharge Instruction: Gently cleanse wound with antibacterial soap, rinse and pat dry prior to dressing wounds Peri-Wound Care Topical thera derm moisturizing lotion to peri wound lower leg Primary Dressing Marcos, Keelin ([id number]) Prisma 4.34 (in) Discharge Instruction: Moisten w/normal saline or sterile water; Cover wound as directed. Do not remove from wound bed. Secondary Dressing ABD Pad 5x9 (in/in) Discharge Instruction: Cover with ABD pad Gauze Discharge Instruction: As directed: dry, moistened with saline or moistened with Dakins Solution Secured With Compression Wrap Medichoice 4 layer Compression System, 35-40 mmHG Discharge Instruction: Apply multi-layer wrap as directed. Compression Stockings Add-Ons Electronic  Signature(s) Signed: 07/21/2021 12:33:32 PM By: Donnamarie Poag Entered By: Donnamarie Poag on 07/21/2021 10:09:50 [name] Bean ([id number]) -------------------------------------------------------------------------------- Vitals Details Patient Name: [name] Bean Date of Service: 07/21/2021 9:30 AM Medical Record Number: [id number] Patient Account Number: [id number] Date of Birth/Sex: [birth year] (86 y.o. M) Treating RN: Donnamarie Poag Primary Care Everton Bertha: Harrel Lemon Other Clinician: Referring Sharee Sturdy: Harrel Lemon Treating Karryn Kosinski/Extender: Skipper Cliche in Treatment: 9 Vital Signs Time Taken: 09:35 Temperature (F): 98.3 Height (in): 73 Pulse (bpm): 82 Weight (lbs): 260 Respiratory Rate (breaths/min): 18 Body Mass Index (BMI): 34.3 Blood Pressure (mmHg): 109/75 Reference Range: 80 - 120 mg /  dl Electronic Signature(s) Signed: 07/21/2021 12:33:32 PM By: Donnamarie Poag Entered ByDonnamarie Poag on 07/21/2021 09:36:51

## 2021-07-26 ENCOUNTER — Other Ambulatory Visit: Payer: Self-pay

## 2021-07-26 ENCOUNTER — Encounter: Payer: Medicare Other | Admitting: Physician Assistant

## 2021-07-26 DIAGNOSIS — I872 Venous insufficiency (chronic) (peripheral): Secondary | ICD-10-CM | POA: Diagnosis not present

## 2021-07-26 NOTE — Progress Notes (Addendum)
[name] Bean, [name] Bean ([id number]) Visit Report for 07/26/2021 Chief Complaint Document Details Patient Name: Bean, [name] Date of Service: 07/26/2021 8:15 AM Medical Record Number: [id number] Patient Account Number: [id number] Date of Birth/Sex: 05/06/1935 (86 y.o. M) Treating RN: [name] Bean Primary Care Provider: Harrel Bean Other Clinician: Referring Provider: Harrel Bean Treating Provider/Extender: [name] Bean in Treatment: 10 Information Obtained from: Patient Chief Complaint Bilateral LE Ulcers Electronic Signature(s) Signed: 07/26/2021 8:15:15 AM By: [name] Keeler PA-C Entered By: [name] Bean on 07/26/2021 08:15:15 [facility], [place] ([id number]) -------------------------------------------------------------------------------- Debridement Details Patient Name: [name] Bean Date of Service: 07/26/2021 8:15 AM Medical Record Number: [id number] Patient Account Number: [id number] Date of Birth/Sex: [birth year] (86 y.o. M) Treating RN: [name] Bean Primary Care Provider: Harrel Bean Other Clinician: Referring Provider: Harrel Bean Treating Provider/Extender: [name] Bean in Treatment: 10 Debridement Performed for Wound #3 Left,Medial,Posterior Lower Leg Assessment: Performed By: Physician [name] Sams., PA-C Debridement Type: Debridement Severity of Tissue Pre Debridement: Fat layer exposed Level of Consciousness (Pre- Awake and Alert procedure): Pre-procedure Verification/Time Out Yes - 09:00 Taken: Total Area Debrided (L x W): 0.8 (cm) x 0.4 (cm) = 0.32 (cm) Tissue and other material Viable, Non-Viable, Slough, Subcutaneous, Biofilm, Slough debrided: Level: Skin/Subcutaneous Tissue Debridement Description: Excisional Instrument: Curette Bleeding: Minimum Hemostasis Achieved: Pressure Response to Treatment: Procedure was tolerated well Level of Consciousness (Post- Awake and Alert procedure): Post Debridement Measurements of Total Wound Length: (cm)  0.8 Width: (cm) 0.4 Depth: (cm) 0.1 Volume: (cm) 0.025 Character of Wound/Ulcer Post Debridement: Stable Severity of Tissue Post Debridement: Fat layer exposed Post Procedure Diagnosis Same as Pre-procedure Electronic Signature(s) Signed: 07/26/2021 4:45:38 PM By: [name] Bean, BSN, RN, CWS, Kim RN, BSN Signed: 07/26/2021 5:02:50 PM By: [name] Keeler PA-C Entered By: [name] Bean, BSN, RN, CWS, Kim on 07/26/2021 09:13:15 [name] Bean ([id number]) -------------------------------------------------------------------------------- HPI Details Patient Name: [name] Bean Date of Service: 07/26/2021 8:15 AM Medical Record Number: [id number] Patient Account Number: [id number] Date of Birth/Sex: 1935-05-02 (86 y.o. M) Treating RN: [name] Bean Primary Care Provider: Harrel Bean Other Clinician: Referring Provider: Harrel Bean Treating Provider/Extender: [name] Bean in Treatment: 10 History of Present Illness HPI Description: 05/17/2021 upon evaluation today patient presents for initial evaluation here in the clinic concerning issues that he has been healing. This again is not terribly uncommon especially with the significant swelling that he is experiencing. He does not have the compression wraps or compression stockings he tells me has never been told he should wear those but he definitely should be wearing those. Fortunately there does not appear to be any evidence of active infection at this time which is great news systemically though locally I do see some evidence of maceration and there is some question as to whether or not honestly he may have some infection present. He definitely has some slough and biofilm that needs to be cleared away. Either way I think with appropriate compression we can definitely get things moving in the right direction. Patient does have a history of chronic venous stasis, lymphedema, and his wounds unfortunately are right in the lower extremity on the left where he  does have significant swelling and lymphedema. This means this can make things somewhat more difficult to heal in general. Other than this he also has a history of hypertension. 05/24/2021 upon evaluation today patient appears to be doing decently well in regard to his wound. He has been tolerating the dressing changes without complication. Fortunately there does not appear to be any evidence of active infection at  this point which is great news and overall I am pleased in that regard. Nonetheless I do believe that the patient is still having quite a bit of discomfort at least when I cleaned the wound but overall I think we are headed in the appropriate direction. No fevers, chills, nausea, vomiting, or diarrhea. 06/09/2021 upon evaluation today patient appears to be doing well with regard to his wound. He has been tolerating the dressing changes without complication. Fortunately there does not appear to be any signs of active infection which is great news and overall very pleased with where we stand today. Nonetheless there is been made to be some sharp debridement performed today. Also think that the patient probably needs a stronger compression I would recommend that we increase to a 4-layer compression wrap. 06/23/2021 upon evaluation today patient appears to be doing better in regard to his leg ulcer. He has been tolerating the dressing changes without complication and overall I am extremely pleased with where we stand I think he is headed in the appropriate direction and this is healing quite nicely. 06/30/2021 upon evaluation today patient appears to be doing well with regard to his wound. In fact this is measuring significantly smaller and overall seems to be doing great. I am very pleased I am hopeful to get this done soon and he will be out of here. He is definitely ready to get out of the Wrap as this is causing a lot of problems with his knee. 07/07/2021 upon evaluation today patient's wound  is actually showing signs of excellent improvement which is great news. Fortunately I think he is headed in the right direction and were getting closer to complete resolution. 07/14/2021 upon evaluation today patient appears to be doing well with regard to his leg ulcers. Fortunately I do not see any signs of active infection which is great news. Overall I think that he is actually doing quite well. 07/21/2021 upon evaluation today patient appears to be doing well with regard to his leg ulcers. He is showing signs of improvement though this is slow to heal but still is making progress. Fortunately there is no signs of active infection systemically which is great news. No fevers, chills, nausea, vomiting, or diarrhea. 07/26/2021 upon evaluation today patient appears to be doing well with regard to his wounds though the collagen is starting to stick badly to the wound bed. I think that were probably can need to switch things up I would recommend Xeroform and give this a shot over the next week as I think this will keep it moist and from drying out. Patient is in agreement with that plan. Overall I am otherwise extremely happy with where we stand today. Electronic Signature(s) Signed: 07/26/2021 9:11:53 AM By: [name] Keeler PA-C Entered By: [name] Bean on 07/26/2021 09:11:53 [facility], [place] ([id number]) -------------------------------------------------------------------------------- Physical Exam Details Patient Name: [name] Bean Date of Service: 07/26/2021 8:15 AM Medical Record Number: [id number] Patient Account Number: [id number] Date of Birth/Sex: [birth year] (86 y.o. M) Treating RN: [name] Bean Primary Care Provider: Harrel Bean Other Clinician: Referring Provider: Harrel Bean Treating Provider/Extender: Jeri Cos Weeks in Treatment: 50 Constitutional Well-nourished and well-hydrated in no acute distress. Respiratory normal breathing without difficulty. Psychiatric this  patient is able to make decisions and demonstrates good insight into disease process. Alert and Oriented x 3. pleasant and cooperative. Notes Patient's wound bed showed signs of good granulation and epithelization at this point. Fortunately there does not appear to be any signs of active  infection systemically which is great news and overall I think that the main issue here is simply keeping the wound bed from drying out I think Xeroform is good to do a good job with that. Electronic Signature(s) Signed: 07/26/2021 9:12:11 AM By: [name] Keeler PA-C Entered By: [name] Bean on 07/26/2021 09:12:10 [facility], [place] ([id number]) -------------------------------------------------------------------------------- Physician Orders Details Patient Name: [name] Bean Date of Service: 07/26/2021 8:15 AM Medical Record Number: [id number] Patient Account Number: [id number] Date of Birth/Sex: Feb 25, 1935 (86 y.o. M) Treating RN: [name] Bean Primary Care Provider: Harrel Bean Other Clinician: Referring Provider: Harrel Bean Treating Provider/Extender: [name] Bean in Treatment: 10 Verbal / Phone Orders: No Diagnosis Coding ICD-10 Coding Code Description I87.2 Venous insufficiency (chronic) (peripheral) I89.0 Lymphedema, not elsewhere classified L97.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 layer exposed L97.812 Non-pressure chronic ulcer of other part of right lower leg with fat layer exposed [place] (primary) hypertension Follow-up Appointments o Return Appointment in 1 week. o Nurse Visit as needed Bathing/ Shower/ Hygiene o May shower with wound dressing protected with water repellent cover or cast protector. o No tub bath. Edema Control - Lymphedema / Segmental Compressive Device / Other o Optional: One layer of unna paste to top of compression wrap (to act as an anchor). o Elevate legs to the level of the heart and pump ankles as often  as possible o Elevate leg(s) parallel to the floor when sitting. o DO YOUR BEST to sleep in the bed at night. DO NOT sleep in your recliner. Long hours of sitting in a recliner leads to swelling of the legs and/or potential wounds on your backside. Additional Orders / Instructions o Follow Nutritious Diet and Increase Protein Intake Wound Treatment Wound #1 - Lower Leg Wound Laterality: Left, Medial, Distal Primary Dressing: Xeroform-HBD 2x2 (in/in) Discharge Instructions: Apply Xeroform-HBD 2x2 (in/in) as directed Secondary Dressing: Gauze Discharge Instructions: As directed: dry, moistened with saline or moistened with Dakins Solution Compression Wrap: Medichoice 4 layer Compression System, 35-40 mmHG Discharge Instructions: Apply multi-layer wrap as directed. Wound #2 - Lower Leg Wound Laterality: Right Primary Dressing: Xeroform 4x4-HBD (in/in) Discharge Instructions: Apply Xeroform 4x4-HBD (in/in) as directed Secondary Dressing: Zetuvit Plus Silicone Border Dressing 4x4 (in/in) Wound #3 - Lower Leg Wound Laterality: Left, Medial, Posterior Primary Dressing: Xeroform-HBD 2x2 (in/in) Discharge Instructions: Apply Xeroform-HBD 2x2 (in/in) as directed Secondary Dressing: Gauze Discharge Instructions: As directed: dry, moistened with saline or moistened with Dakins Solution Compression Wrap: Medichoice 4 layer Compression System, 35-40 mmHG Discharge Instructions: Apply multi-layer wrap as directed. WYNTON, HUFSTETLER ([id number]) Electronic Signature(s) Signed: 07/26/2021 4:45:38 PM By: [name] Bean, BSN, RN, CWS, Kim RN, BSN Signed: 07/26/2021 5:02:50 PM By: [name] Keeler PA-C Entered By: [name] Bean BSN, RN, CWS, Kim on 07/26/2021 09:16:08 [name] Bean ([id number]) -------------------------------------------------------------------------------- Problem List Details Patient Name: [name] Bean Date of Service: 07/26/2021 8:15 AM Medical Record Number: [id number] Patient Account Number:  [id number] Date of Birth/Sex: [birth year] (86 y.o. M) Treating RN: [name] Bean Primary Care Provider: Harrel Bean Other Clinician: Referring Provider: Harrel Bean Treating Provider/Extender: [name] Bean in Treatment: 10 Active Problems ICD-10 Encounter Code Description Active Date MDM Diagnosis I87.2 Venous insufficiency (chronic) (peripheral) 05/17/2021 No Yes I89.0 Lymphedema, not elsewhere classified 05/17/2021 No Yes L97.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 layer 05/17/2021 No Yes exposed L97.812 Non-pressure chronic ulcer of other part of right lower leg with fat layer 07/21/2021 No Yes exposed [place] (primary) hypertension  05/17/2021 No Yes Inactive Problems Resolved Problems Electronic Signature(s) Signed: 07/26/2021 8:15:07 AM By: [name] Keeler PA-C Entered By: [name] Bean on 07/26/2021 08:15:07 [facility], [place] ([id number]) -------------------------------------------------------------------------------- Progress Note Details Patient Name: [name] Bean Date of Service: 07/26/2021 8:15 AM Medical Record Number: [id number] Patient Account Number: [id number] Date of Birth/Sex: 1935-02-15 (86 y.o. M) Treating RN: [name] Bean Primary Care Provider: Harrel Bean Other Clinician: Referring Provider: Harrel Bean Treating Provider/Extender: [name] Bean in Treatment: 10 Subjective Chief Complaint Information obtained from Patient Bilateral LE Ulcers History of Present Illness (HPI) 05/17/2021 upon evaluation today patient presents for initial evaluation here in the clinic concerning issues that he has been healing. This again is not terribly uncommon especially with the significant swelling that he is experiencing. He does not have the compression wraps or compression stockings he tells me has never been told he should wear those but he definitely should be wearing those. Fortunately there does not appear to be any  evidence of active infection at this time which is great news systemically though locally I do see some evidence of maceration and there is some question as to whether or not honestly he may have some infection present. He definitely has some slough and biofilm that needs to be cleared away. Either way I think with appropriate compression we can definitely get things moving in the right direction. Patient does have a history of chronic venous stasis, lymphedema, and his wounds unfortunately are right in the lower extremity on the left where he does have significant swelling and lymphedema. This means this can make things somewhat more difficult to heal in general. Other than this he also has a history of hypertension. 05/24/2021 upon evaluation today patient appears to be doing decently well in regard to his wound. He has been tolerating the dressing changes without complication. Fortunately there does not appear to be any evidence of active infection at this point which is great news and overall I am pleased in that regard. Nonetheless I do believe that the patient is still having quite a bit of discomfort at least when I cleaned the wound but overall I think we are headed in the appropriate direction. No fevers, chills, nausea, vomiting, or diarrhea. 06/09/2021 upon evaluation today patient appears to be doing well with regard to his wound. He has been tolerating the dressing changes without complication. Fortunately there does not appear to be any signs of active infection which is great news and overall very pleased with where we stand today. Nonetheless there is been made to be some sharp debridement performed today. Also think that the patient probably needs a stronger compression I would recommend that we increase to a 4-layer compression wrap. 06/23/2021 upon evaluation today patient appears to be doing better in regard to his leg ulcer. He has been tolerating the dressing changes without  complication and overall I am extremely pleased with where we stand I think he is headed in the appropriate direction and this is healing quite nicely. 06/30/2021 upon evaluation today patient appears to be doing well with regard to his wound. In fact this is measuring significantly smaller and overall seems to be doing great. I am very pleased I am hopeful to get this done soon and he will be out of here. He is definitely ready to get out of the Wrap as this is causing a lot of problems with his knee. 07/07/2021 upon evaluation today patient's wound is actually showing signs of excellent improvement which is great  news. Fortunately I think he is headed in the right direction and were getting closer to complete resolution. 07/14/2021 upon evaluation today patient appears to be doing well with regard to his leg ulcers. Fortunately I do not see any signs of active infection which is great news. Overall I think that he is actually doing quite well. 07/21/2021 upon evaluation today patient appears to be doing well with regard to his leg ulcers. He is showing signs of improvement though this is slow to heal but still is making progress. Fortunately there is no signs of active infection systemically which is great news. No fevers, chills, nausea, vomiting, or diarrhea. 07/26/2021 upon evaluation today patient appears to be doing well with regard to his wounds though the collagen is starting to stick badly to the wound bed. I think that were probably can need to switch things up I would recommend Xeroform and give this a shot over the next week as I think this will keep it moist and from drying out. Patient is in agreement with that plan. Overall I am otherwise extremely happy with where we stand today. Objective Constitutional Well-nourished and well-hydrated in no acute distress. Vitals Time Taken: 8:10 AM, Height: 73 in, Weight: 260 lbs, BMI: 34.3, Temperature: 98.0 F, Pulse: 78 bpm, Respiratory  Rate: 16 breaths/min, Mcfarland, [place]ine ([id number]) Blood Pressure: 102/68 mmHg. Respiratory normal breathing without difficulty. Psychiatric this patient is able to make decisions and demonstrates good insight into disease process. Alert and Oriented x 3. pleasant and cooperative. General Notes: Patient's wound bed showed signs of good granulation and epithelization at this point. Fortunately there does not appear to be any signs of active infection systemically which is great news and overall I think that the main issue here is simply keeping the wound bed from drying out I think Xeroform is good to do a good job with that. Integumentary (Hair, Skin) Wound #1 status is Open. Original cause of wound was Bump. The date acquired was: 05/03/2021. The wound has been in treatment 10 weeks. The wound is located on the Left,Distal,Medial Lower Leg. The wound measures 1.8cm length x 1.4cm width x 0.1cm depth; 1.979cm^2 area and 0.198cm^3 volume. There is Fat Layer (Subcutaneous Tissue) exposed. There is no tunneling or undermining noted. There is a medium amount of serosanguineous drainage noted. There is no granulation within the wound bed. There is a large (67-100%) amount of necrotic tissue within the wound bed including Eschar. Wound #2 status is Open. Original cause of wound was Gradually Appeared. The date acquired was: 07/21/2021. The wound is located on the Right Lower Leg. The wound measures 1.4cm length x 0.5cm width x 0.1cm depth; 0.55cm^2 area and 0.055cm^3 volume. There is no tunneling or undermining noted. There is a medium amount of serosanguineous drainage noted. There is large (67-100%) red, pink granulation within the wound bed. There is a small (1-33%) amount of necrotic tissue within the wound bed including Adherent Slough. Wound #3 status is Open. Original cause of wound was Gradually Appeared. The date acquired was: 07/19/2021. The wound is located on the Left,Medial,Posterior Lower  Leg. The wound measures 0.8cm length x 0.4cm width x 0.1cm depth; 0.251cm^2 area and 0.025cm^3 volume. There is Fat Layer (Subcutaneous Tissue) exposed. There is no tunneling or undermining noted. There is a small amount of serous drainage noted. There is large (67-100%) granulation within the wound bed. There is no necrotic tissue within the wound bed. Assessment Active Problems ICD-10 Venous insufficiency (chronic) (peripheral) Lymphedema, not elsewhere  classified Non-pressure chronic ulcer of other part of left lower leg with fat layer exposed Non-pressure chronic ulcer of other part of right lower leg with fat layer exposed [place] (primary) hypertension Plan 1. Would recommend that we going continue with the wound care measures as before and the patient is in agreement with plan this includes the use of the Xeroform gauze dressing which I think will be a good option for him. 2. Also can recommend that we have the patient continue to monitor for any signs of worsening or infection. Also if anything changes he should let me know but again the main thing he will notice as if there is any drainage or increased pain and obviously the drainage would mean coming through the wrap I seriously doubt that is can happen. We will see patient back for reevaluation in 1 week here in the clinic. If anything worsens or changes patient will contact our office for additional recommendations. Electronic Signature(s) Signed: 07/26/2021 9:12:47 AM By: [name] Keeler PA-C Entered By: [name] Bean on 07/26/2021 09:12:47 [facility], [place] ([id number]) -------------------------------------------------------------------------------- SuperBill Details Patient Name: [name] Bean Date of Service: 07/26/2021 Medical Record Number: [id number] Patient Account Number: [id number] Date of Birth/Sex: [birth year] (86 y.o. M) Treating RN: [name] Bean Primary Care Provider: Harrel Bean Other Clinician: Referring  Provider: Harrel Bean Treating Provider/Extender: [name] Bean in Treatment: 10 Diagnosis Coding ICD-10 Codes Code Description I87.2 Venous insufficiency (chronic) (peripheral) I89.0 Lymphedema, not elsewhere classified L97.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 layer exposed L97.812 Non-pressure chronic ulcer of other part of right lower leg with fat layer exposed [place] (primary) hypertension Facility Procedures CPT4: Description Modifier Quantity Code 11031594 [id number] BILATERAL: Application of multi-layer venous compression system; leg (below knee), including 1 ankle and foot. Physician Procedures CPT4 Code: 9244628 Description: 63817 - WC PHYS LEVEL 3 - EST PT Modifier: Quantity: 1 CPT4 Code: Description: ICD-10 Diagnosis Description I87.2 Venous insufficiency (chronic) (peripheral) I89.0 Lymphedema, not elsewhere classified L97.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 lay L97.812 Non-pressure chronic ulcer of  other part of right lower leg with fat la Modifier: er exposed yer exposed Quantity: Electronic Signature(s) Signed: 07/26/2021 4:41:30 PM By: [name] Bean, BSN, RN, CWS, Kim RN, BSN Signed: 07/26/2021 5:02:50 PM By: [name] Keeler PA-C Previous Signature: 07/26/2021 9:13:01 AM Version By: [name] Keeler PA-C Entered By: [name] Bean, BSN, RN, CWS, Kim on 07/26/2021 16:41:29

## 2021-07-26 NOTE — Progress Notes (Signed)
THERAN, VANDERGRIFT ([id number]) Visit Report for 07/26/2021 Arrival Information Details Patient Name: [name] Bean, [name] Bean Date of Service: 07/26/2021 8:15 AM Medical Record Number: [id number] Patient Account Number: [id number] Date of Birth/Sex: [birth year] (86 y.o. M) Treating Bean: [name] Bean Primary Care [name] Bean: [name] Bean Other Clinician: Referring [name] Bean: [name] Bean Treating [name] Bean/Extender: [name] Bean in Treatment: 10 Visit Information History Since Last Visit Added or deleted any medications: No Patient Arrived: [name] Bean Had a fall or experienced change in No Arrival Time: 08:05 activities of daily living that may affect Accompanied By: self risk of falls: Transfer Assistance: EasyPivot Patient Lift Hospitalized since last visit: No Patient Identification Verified: Yes Has Dressing in Place as Prescribed: Yes Secondary Verification Process Completed: Yes Pain Present Now: Yes Patient Has Alerts: Yes Patient Alerts: Patient on Blood Thinner Aspirin 60m Electronic Signature(s) Signed: 07/26/2021 11:32:06 AM By: BDonnamarie PoagEntered By: BDonnamarie Poagon 07/26/2021 08:14:50 ECheral Bean([id number] -------------------------------------------------------------------------------- Encounter Discharge Information Details Patient Name: ECheral MarkerDate of Service: 07/26/2021 8:15 AM Medical Record Number: [id number] Account Number: [id number] of Birth/Sex: [birth year](86 y.o. M) Treating Bean: [name] BarmanPrimary Care Kesean Serviss: [name] LemonOther Clinician: Referring Eyad Rochford: [name] LemonTreating Nieves Chapa/Extender: SSkipper Clichein Treatment: 10 Encounter Discharge Information Items Post Procedure Vitals Discharge Condition: Stable Unable to obtain vitals Reason: limited time Ambulatory Status: Ambulatory Discharge Destination: Home Transportation: Private Auto Schedule Follow-up Appointment: Yes Clinical Summary of Care: Electronic  Signature(s) Signed: 07/26/2021 4:42:30 PM By: WGretta Bean BSN, Bean, CWS, [name] Bean, BSN Entered By: WGretta Bean BSN, Bean, [name] Bean on 07/26/2021 16:42:30 ECheral Bean([id number] -------------------------------------------------------------------------------- Lower Extremity Assessment Details Patient Name: ECheral MarkerDate of Service: 07/26/2021 8:15 AM Medical Record Number: [id number] Account Number: [id number] of Birth/Sex: [birth year](86 y.o. M) Treating Bean: BDonnamarie PoagPrimary Care Timika Muench: [name] LemonOther Clinician: Referring [name] Bean: [name] LemonTreating Carsin Randazzo/Extender: [name] CosWeeks in Treatment: 10 Edema Assessment Assessed: [Left: Yes] [Right: Yes] Edema: [Left: Yes] [Right: Yes] Calf Left: Right: Point of Measurement: 40 cm From Medial Instep 42 cm Ankle Left: Right: Point of Measurement: 14 cm From Medial Instep 28 cm Knee To Floor Left: Right: From Medial Instep 47 cm Vascular Assessment Pulses: Dorsalis Pedis Palpable: [Left:Yes] Electronic Signature(s) Signed: 07/26/2021 11:32:06 AM By: BDonnamarie PoagEntered By: BDonnamarie Poagon 07/26/2021 08:24:00 [facility] JJeneen Bean([id number] -------------------------------------------------------------------------------- Multi Wound Chart Details Patient Name: ECheral MarkerDate of Service: 07/26/2021 8:15 AM Medical Record Number: [id number] Account Number: [id number] of Birth/Sex: [birth year](86 y.o. M) Treating Bean: BDonnamarie PoagPrimary Care Enya Bureau: [name] LemonOther Clinician: Referring [name] Bean: [name] LemonTreating [name] Bean/Extender: [name] CosWeeks in Treatment: 10 Vital Signs Height(in): 730Pulse(bpm): 786Weight(lbs): 260 Blood Pressure(mmHg): 102/68 Body Mass Index(BMI): 34 Temperature(F): 98.0 Respiratory Rate(breaths/min): 16 Photos: [N/A:N/A] Wound Location: Left, Distal, Medial Lower Leg Right Lower Leg N/A Wounding Event: Bump Gradually Appeared N/A Primary Etiology:  Trauma, Other Venous Leg Ulcer N/A Secondary Etiology: Lymphedema N/A N/A Comorbid History: Cataracts, Anemia, Asthma, Sleep Cataracts, Anemia, Asthma, Sleep N/A Apnea, Coronary Artery Disease, Apnea, Coronary Artery Disease, Osteoarthritis Osteoarthritis Date Acquired: 05/03/2021 07/21/2021 N/A Weeks of Treatment: 10 0 N/A Wound Status: Open Open N/A Clustered Wound: Yes No N/A Measurements L x W x D (cm) 0.4x0.2x0.1 1.4x0.5x0.1 N/A Area (cm) : 0.063 0.55 N/A Volume (cm) : 0.006 0.055 N/A % Reduction in Area: 99.20% 0.00% N/A % Reduction in Volume: 99.70% 0.00% N/A Classification: Full Thickness Without Exposed Partial Thickness N/A Support Structures Exudate Amount: Medium Medium N/A Exudate  Type: Serosanguineous Serosanguineous N/A Exudate Color: red, brown red, brown N/A Granulation Amount: None Present (0%) Large (67-100%) N/A Granulation Quality: N/A Red, Pink N/A Necrotic Amount: Large (67-100%) Small (1-33%) N/A Necrotic Tissue: Eschar Adherent Slough N/A Exposed Structures: Fat Layer (Subcutaneous Tissue): N/A N/A Yes Fascia: No Tendon: No Muscle: No Joint: No Bone: No Epithelialization: Small (1-33%) N/A N/A Treatment Notes Electronic Signature(s) Signed: 07/26/2021 4:45:38 PM By: [name] Bean, BSN, Bean, CWS, [name] Bean, BSN Entered By: [name] Bean, BSN, Bean, [name] Bean on 07/26/2021 08:54:33 Bean, [name] ([id number]) [name] Bean ([id number]) -------------------------------------------------------------------------------- Multi-Disciplinary Care Plan Details Patient Name: [name] Bean Date of Service: 07/26/2021 8:15 AM Medical Record Number: [id number] Patient Account Number: [id number] Date of Birth/Sex: 10/03/1934 (86 y.o. M) Treating Bean: [name] Bean Primary Care Wade Asebedo: [name] Bean Other Clinician: Referring Vayla Wilhelmi: [name] Bean Treating Bastion Bolger/Extender: [name] Bean in Treatment: 10 Active Inactive Wound/Skin Impairment Nursing Diagnoses: Impaired tissue  integrity Knowledge deficit related to smoking impact on wound healing Knowledge deficit related to ulceration/compromised skin integrity Goals: Ulcer/skin breakdown will have a volume reduction of 30% by week 4 Date Initiated: 05/17/2021 Date Inactivated: 06/30/2021 Target Resolution Date: 06/16/2021 Goal Status: Met Ulcer/skin breakdown will have a volume reduction of 50% by week 8 Date Initiated: 05/17/2021 Date Inactivated: 07/14/2021 Target Resolution Date: 07/17/2021 Goal Status: Met Ulcer/skin breakdown will have a volume reduction of 80% by week 12 Date Initiated: 05/17/2021 Target Resolution Date: 08/16/2021 Goal Status: Active Ulcer/skin breakdown will heal within 14 weeks Date Initiated: 05/17/2021 Target Resolution Date: 08/30/2021 Goal Status: Active Interventions: Assess patient/caregiver ability to obtain necessary supplies Assess patient/caregiver ability to perform ulcer/skin care regimen upon admission and as needed Assess ulceration(s) every visit Notes: Electronic Signature(s) Signed: 07/26/2021 11:32:06 AM By: [name] Bean Signed: 07/26/2021 4:45:38 PM By: [name] Bean, BSN, Bean, CWS, [name] Bean, BSN Entered By: [name] Bean, BSN, Bean, [name] Bean on 07/26/2021 08:52:48 [name] Bean ([id number]) -------------------------------------------------------------------------------- Pain Assessment Details Patient Name: [name] Bean Date of Service: 07/26/2021 8:15 AM Medical Record Number: [id number] Patient Account Number: [id number] Date of Birth/Sex: Nov 10, 1934 (86 y.o. M) Treating Bean: [name] Bean Primary Care Ezella Kell: [name] Bean Other Clinician: Referring Dorethy Tomey: [name] Bean Treating Cass Edinger/Extender: [name] Bean in Treatment: 10 Active Problems Location of Pain Severity and Description of Pain Patient Has Paino Yes Site Locations Pain Location: Generalized Pain Rate the pain. Current Pain Level: 7 Pain Management and Medication Current Pain  Management: Electronic Signature(s) Signed: 07/26/2021 11:32:06 AM By: [name] Bean Entered By: [name] Bean on 07/26/2021 08:20:56 [facility], [name] Bean ([id number]) -------------------------------------------------------------------------------- Patient/Caregiver Education Details Patient Name: [name] Bean Date of Service: 07/26/2021 8:15 AM Medical Record Number: [id number] Patient Account Number: [id number] Date of Birth/Gender: Feb 18, 1935 (86 y.o. M) Treating Bean: Cornell Bean Primary Care Physician: [name] Bean Other Clinician: Referring Physician: Harrel Bean Treating Physician/Extender: [name] Bean in Treatment: 10 Education Assessment Education Provided To: Patient Education Topics Provided Venous: Handouts: Controlling Swelling with Multilayered Compression Wraps Methods: Demonstration, Explain/Verbal Responses: State content correctly Electronic Signature(s) Signed: 07/26/2021 4:45:38 PM By: [name] Bean, BSN, Bean, CWS, [name] Bean, BSN Entered By: [name] Bean, BSN, Bean, [name] Bean on 07/26/2021 16:41:47 [name] Bean ([id number]) -------------------------------------------------------------------------------- Wound Assessment Details Patient Name: [name] Bean Date of Service: 07/26/2021 8:15 AM Medical Record Number: [id number] Patient Account Number: [id number] Date of Birth/Sex: 14-Dec-1934 (86 y.o. M) Treating Bean: Cornell Bean Primary Care Clairessa Boulet: [name] Bean Other Clinician: Referring Loreta Blouch: [name] Bean Treating Kijuana Ruppel/Extender: Jeri Bean Weeks in Treatment: 10 Wound Status Wound Number: 1 Primary Trauma, Other Etiology: Wound Location: Left, Distal, Medial Lower Leg  Secondary Lymphedema Wounding Event: Bump Etiology: Date Acquired: 05/03/2021 Wound Status: Open Weeks Of Treatment: 10 Comorbid Cataracts, Anemia, Asthma, Sleep Apnea, Coronary Clustered Wound: Yes History: Artery Disease, Osteoarthritis Photos Wound Measurements Length: (cm) 1.8 Width: (cm)  1.4 Depth: (cm) 0.1 Clustered Quantity: 2 Area: (cm) 1.979 Volume: (cm) 0.198 % Reduction in Area: 73.8% % Reduction in Volume: 91.3% Epithelialization: Small (1-33%) Tunneling: No Undermining: No Wound Description Classification: Full Thickness Without Exposed Support Structu Exudate Amount: Medium Exudate Type: Serosanguineous Exudate Color: red, brown res Foul Odor After Cleansing: No Slough/Fibrino Yes Wound Bed Granulation Amount: None Present (0%) Exposed Structure Necrotic Amount: Large (67-100%) Fascia Exposed: No Necrotic Quality: Eschar Fat Layer (Subcutaneous Tissue) Exposed: Yes Tendon Exposed: No Muscle Exposed: No Joint Exposed: No Bone Exposed: No Treatment Notes Wound #1 (Lower Leg) Wound Laterality: Left, Medial, Distal Cleanser Peri-Wound Care Topical Beshara, Lavan ([id number]) Primary Dressing Xeroform-HBD 2x2 (in/in) Discharge Instruction: Apply Xeroform-HBD 2x2 (in/in) as directed Secondary Dressing Gauze Discharge Instruction: As directed: dry, moistened with saline or moistened with Dakins Solution Secured With Compression Wrap Medichoice 4 layer Compression System, 35-40 mmHG Discharge Instruction: Apply multi-layer wrap as directed. Compression Stockings Environmental education officer) Signed: 07/26/2021 4:45:38 PM By: [name] Bean, BSN, Bean, CWS, [name] Bean, BSN Entered By: [name] Bean, BSN, Bean, [name] Bean on 07/26/2021 08:59:40 [name] Bean ([id number]) -------------------------------------------------------------------------------- Wound Assessment Details Patient Name: [name] Bean Date of Service: 07/26/2021 8:15 AM Medical Record Number: [id number] Patient Account Number: [id number] Date of Birth/Sex: 08-06-1935 (86 y.o. M) Treating Bean: [name] Bean Primary Care Emberleigh Reily: [name] Bean Other Clinician: Referring Alzora Ha: [name] Bean Treating Griff Badley/Extender: Jeri Bean Weeks in Treatment: 10 Wound Status Wound Number: 2 Primary Venous Leg  Ulcer Etiology: Wound Location: Right Lower Leg Wound Status: Open Wounding Event: Gradually Appeared Comorbid Cataracts, Anemia, Asthma, Sleep Apnea, Coronary Date Acquired: 07/21/2021 History: Artery Disease, Osteoarthritis Weeks Of Treatment: 0 Clustered Wound: No Photos Wound Measurements Length: (cm) 1.4 Width: (cm) 0.5 Depth: (cm) 0.1 Area: (cm) 0.55 Volume: (cm) 0.055 % Reduction in Area: 0% % Reduction in Volume: 0% Tunneling: No Undermining: No Wound Description Classification: Partial Thickness Exudate Amount: Medium Exudate Type: Serosanguineous Exudate Color: red, brown Foul Odor After Cleansing: No Slough/Fibrino Yes Wound Bed Granulation Amount: Large (67-100%) Granulation Quality: Red, Pink Necrotic Amount: Small (1-33%) Necrotic Quality: Adherent Slough Treatment Notes Wound #2 (Lower Leg) Wound Laterality: Right Cleanser Peri-Wound Care Topical Primary Dressing Xeroform 4x4-HBD (in/in) Discharge Instruction: Apply Xeroform 4x4-HBD (in/in) as directed [facility], [name] Bean ([id number]) Secondary Dressing Zetuvit Plus Silicone Border Dressing 4x4 (in/in) Secured With Compression Wrap Compression Stockings Add-Ons Electronic Signature(s) Signed: 07/26/2021 11:32:06 AM By: [name] Bean Entered By: [name] Bean on 07/26/2021 08:23:16 [name] Bean ([id number]) -------------------------------------------------------------------------------- Wound Assessment Details Patient Name: [name] Bean Date of Service: 07/26/2021 8:15 AM Medical Record Number: [id number] Patient Account Number: [id number] Date of Birth/Sex: April 26, 1935 (86 y.o. M) Treating Bean: Cornell Bean Primary Care Zhoey Blackstock: [name] Bean Other Clinician: Referring Maelie Chriswell: [name] Bean Treating Arnav Cregg/Extender: Jeri Bean Weeks in Treatment: 10 Wound Status Wound Number: 3 Primary Venous Leg Ulcer Etiology: Wound Location: Left, Medial, Posterior Lower Leg Wound Status: Open Wounding  Event: Gradually Appeared Comorbid Cataracts, Anemia, Asthma, Sleep Apnea, Coronary Date Acquired: 07/19/2021 History: Artery Disease, Osteoarthritis Weeks Of Treatment: 0 Clustered Wound: No Photos Photo Uploaded By: [name] Bean, BSN, Bean, [name] Bean on 07/26/2021 09:37:25 Wound Measurements Length: (cm) 0.8 Width: (cm) 0.4 Depth: (cm) 0.1 Area: (cm) 0.251 Volume: (cm) 0.025 % Reduction in Area: 0% % Reduction in Volume: 0% Tunneling: No Undermining:  No Wound Description Classification: Partial Thickness Exudate Amount: Small Exudate Type: Serous Exudate Color: amber Foul Odor After Cleansing: No Slough/Fibrino No Wound Bed Granulation Amount: Large (67-100%) Exposed Structure Necrotic Amount: None Present (0%) Fascia Exposed: No Fat Layer (Subcutaneous Tissue) Exposed: Yes Tendon Exposed: No Muscle Exposed: No Joint Exposed: No Bone Exposed: No Treatment Notes Wound #3 (Lower Leg) Wound Laterality: Left, Medial, Posterior Cleanser Peri-Wound Care Topical Sheetz, Fisher ([id number]) Primary Dressing Xeroform-HBD 2x2 (in/in) Discharge Instruction: Apply Xeroform-HBD 2x2 (in/in) as directed Secondary Dressing Gauze Discharge Instruction: As directed: dry, moistened with saline or moistened with Dakins Solution Secured With Compression Wrap Medichoice 4 layer Compression System, 35-40 mmHG Discharge Instruction: Apply multi-layer wrap as directed. Compression Stockings Environmental education officer) Signed: 07/26/2021 4:45:38 PM By: [name] Bean, BSN, Bean, CWS, [name] Bean, BSN Entered By: [name] Bean, BSN, Bean, [name] Bean on 07/26/2021 09:00:22 [name] Bean ([id number]) -------------------------------------------------------------------------------- Vitals Details Patient Name: [name] Bean Date of Service: 07/26/2021 8:15 AM Medical Record Number: [id number] Patient Account Number: [id number] Date of Birth/Sex: [birth year] (86 y.o. M) Treating Bean: [name] Bean Primary Care Cheryl Stabenow:  [name] Bean Other Clinician: Referring Jaxsen Bernhart: [name] Bean Treating Keiran Sias/Extender: [name] Bean in Treatment: 10 Vital Signs Time Taken: 08:10 Temperature (F): 98.0 Height (in): 73 Pulse (bpm): 78 Weight (lbs): 260 Respiratory Rate (breaths/min): 16 Body Mass Index (BMI): 34.3 Blood Pressure (mmHg): 102/68 Reference Range: 80 - 120 mg / dl Electronic Signature(s) Signed: 07/26/2021 11:32:06 AM By: [name] Bean Entered ByDonnamarie Bean on 07/26/2021 61:22:44

## 2021-08-04 ENCOUNTER — Other Ambulatory Visit: Payer: Self-pay

## 2021-08-04 ENCOUNTER — Encounter: Payer: Medicare Other | Attending: Physician Assistant | Admitting: Physician Assistant

## 2021-08-04 DIAGNOSIS — I10 Essential (primary) hypertension: Secondary | ICD-10-CM | POA: Insufficient documentation

## 2021-08-04 DIAGNOSIS — Z872 Personal history of diseases of the skin and subcutaneous tissue: Secondary | ICD-10-CM | POA: Diagnosis not present

## 2021-08-04 DIAGNOSIS — Z09 Encounter for follow-up examination after completed treatment for conditions other than malignant neoplasm: Secondary | ICD-10-CM | POA: Diagnosis present

## 2021-08-04 DIAGNOSIS — I890 Lymphedema, not elsewhere classified: Secondary | ICD-10-CM | POA: Diagnosis not present

## 2021-08-04 NOTE — Progress Notes (Addendum)
JACHOB, MCCLEAN ([id number]) Visit Report for 08/04/2021 Chief Complaint Document Details Patient Name: [name] Bean, [name] Bean Date of Service: 08/04/2021 11:15 AM Medical Record Number: [id number] Patient Account Number: [id number] Date of Birth/Sex: 06/24/1935 (86 y.o. M) Treating RN: Cornell Barman Primary Care Provider: Harrel Lemon Other Clinician: Referring Provider: Harrel Lemon Treating Provider/Extender: Skipper Cliche in Treatment: 11 Information Obtained from: Patient Chief Complaint Bilateral LE Ulcers Electronic Signature(s) Signed: 08/04/2021 11:14:13 AM By: Worthy Keeler PA-C Entered By: Worthy Keeler on 08/04/2021 11:14:12 [name], [name] ([id number]) -------------------------------------------------------------------------------- HPI Details Patient Name: [name] Bean Date of Service: 08/04/2021 11:15 AM Medical Record Number: [id number] Patient Account Number: [id number] Date of Birth/Sex: Jun 14, 1935 (86 y.o. M) Treating RN: Cornell Barman Primary Care Provider: Harrel Lemon Other Clinician: Referring Provider: Harrel Lemon Treating Provider/Extender: Skipper Cliche in Treatment: 11 History of Present Illness HPI Description: 05/17/2021 upon evaluation today patient presents for initial evaluation here in the clinic concerning issues that he has been healing. This again is not terribly uncommon especially with the significant swelling that he is experiencing. He does not have the compression wraps or compression stockings he tells me has never been told he should wear those but he definitely should be wearing those. Fortunately there does not appear to be any evidence of active infection at this time which is great news systemically though locally I do see some evidence of maceration and there is some question as to whether or not honestly he may have some infection present. He definitely has some slough and biofilm that needs to be cleared away. Either way I think with  appropriate compression we can definitely get things moving in the right direction. Patient does have a history of chronic venous stasis, lymphedema, and his wounds unfortunately are right in the lower extremity on the left where he does have significant swelling and lymphedema. This means this can make things somewhat more difficult to heal in general. Other than this he also has a history of hypertension. 05/24/2021 upon evaluation today patient appears to be doing decently well in regard to his wound. He has been tolerating the dressing changes without complication. Fortunately there does not appear to be any evidence of active infection at this point which is great news and overall I am pleased in that regard. Nonetheless I do believe that the patient is still having quite a bit of discomfort at least when I cleaned the wound but overall I think we are headed in the appropriate direction. No fevers, chills, nausea, vomiting, or diarrhea. 06/09/2021 upon evaluation today patient appears to be doing well with regard to his wound. He has been tolerating the dressing changes without complication. Fortunately there does not appear to be any signs of active infection which is great news and overall very pleased with where we stand today. Nonetheless there is been made to be some sharp debridement performed today. Also think that the patient probably needs a stronger compression I would recommend that we increase to a 4-layer compression wrap. 06/23/2021 upon evaluation today patient appears to be doing better in regard to his leg ulcer. He has been tolerating the dressing changes without complication and overall I am extremely pleased with where we stand I think he is headed in the appropriate direction and this is healing quite nicely. 06/30/2021 upon evaluation today patient appears to be doing well with regard to his wound. In fact this is measuring significantly smaller and overall seems to be doing  great. I am very pleased I  am hopeful to get this done soon and he will be out of here. He is definitely ready to get out of the Wrap as this is causing a lot of problems with his knee. 07/07/2021 upon evaluation today patient's wound is actually showing signs of excellent improvement which is great news. Fortunately I think he is headed in the right direction and were getting closer to complete resolution. 07/14/2021 upon evaluation today patient appears to be doing well with regard to his leg ulcers. Fortunately I do not see any signs of active infection which is great news. Overall I think that he is actually doing quite well. 07/21/2021 upon evaluation today patient appears to be doing well with regard to his leg ulcers. He is showing signs of improvement though this is slow to heal but still is making progress. Fortunately there is no signs of active infection systemically which is great news. No fevers, chills, nausea, vomiting, or diarrhea. 07/26/2021 upon evaluation today patient appears to be doing well with regard to his wounds though the collagen is starting to stick badly to the wound bed. I think that were probably can need to switch things up I would recommend Xeroform and give this a shot over the next week as I think this will keep it moist and from drying out. Patient is in agreement with that plan. Overall I am otherwise extremely happy with where we stand today. 08/04/2021 upon evaluation today patient appears to be pretty much healed based on what I am seeing currently. I do not see any signs of active infection at this point which is great news and overall I am extremely pleased with where we stand today. Electronic Signature(s) Signed: 08/04/2021 4:51:24 PM By: Worthy Keeler PA-C Entered By: Worthy Keeler on 08/04/2021 16:51:24 [facility], [name] ([id number]) -------------------------------------------------------------------------------- Physical Exam Details Patient Name:  [name] Bean Date of Service: 08/04/2021 11:15 AM Medical Record Number: [id number] Patient Account Number: [id number] Date of Birth/Sex: 03-Aug-1935 (86 y.o. M) Treating RN: Cornell Barman Primary Care Provider: Harrel Lemon Other Clinician: Referring Provider: Harrel Lemon Treating Provider/Extender: Skipper Cliche in Treatment: 64 Constitutional Well-nourished and well-hydrated in no acute distress. Respiratory normal breathing without difficulty. Psychiatric this patient is able to make decisions and demonstrates good insight into disease process. Alert and Oriented x 3. pleasant and cooperative. Notes Upon inspection patient's wounds pretty much appear to be completely healed he has just a tiny area that might be leaking a little bit on the right leg but this is from intensive purposes closed as well. I think he just probably needs to keep his bandage over this and I will still update sexually not even truly a wound at this point. His left leg is completely healed. Electronic Signature(s) Signed: 08/04/2021 4:51:50 PM By: Worthy Keeler PA-C Entered By: Worthy Keeler on 08/04/2021 16:51:50 [facility], [place] ([id number]) -------------------------------------------------------------------------------- Physician Orders Details Patient Name: [name] Bean Date of Service: 08/04/2021 11:15 AM Medical Record Number: [id number] Patient Account Number: [id number] Date of Birth/Sex: 09/28/1934 (86 y.o. M) Treating RN: Cornell Barman Primary Care Provider: Harrel Lemon Other Clinician: Referring Provider: Harrel Lemon Treating Provider/Extender: Skipper Cliche in Treatment: 11 Verbal / Phone Orders: No Diagnosis Coding ICD-10 Coding Code Description I87.2 Venous insufficiency (chronic) (peripheral) I89.0 Lymphedema, not elsewhere classified L97.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 layer exposed L97.812 Non-pressure chronic ulcer of other part of right  lower leg with fat layer exposed [place] (primary) hypertension Discharge  From [facility] o Discharge from [place] [facility] o Wear compression garments daily. Put garments on first thing when you wake up and remove them before bed. o Moisturize legs daily after removing compression garments. o Elevate, Exercise Daily and Avoid Standing for Long Periods of Time. Electronic Signature(s) Signed: 08/04/2021 11:53:14 AM By: Gretta Cool, BSN, RN, CWS, Kim RN, BSN Signed: 08/04/2021 5:34:12 PM By: Worthy Keeler PA-C Entered By: Gretta Cool BSN, RN, CWS, Kim on 08/04/2021 11:47:26 [place], [place] ([id number]) -------------------------------------------------------------------------------- Problem List Details Patient Name: [name] Bean Date of Service: 08/04/2021 11:15 AM Medical Record Number: [id number] Patient Account Number: [id number] Date of Birth/Sex: Apr 11, 1935 (86 y.o. M) Treating RN: Cornell Barman Primary Care Provider: Harrel Lemon Other Clinician: Referring Provider: Harrel Lemon Treating Provider/Extender: Skipper Cliche in Treatment: 11 Active Problems ICD-10 Encounter Code Description Active Date MDM Diagnosis I87.2 Venous insufficiency (chronic) (peripheral) 05/17/2021 No Yes I89.0 Lymphedema, not elsewhere classified 05/17/2021 No Yes L97.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 layer 05/17/2021 No Yes exposed L97.812 Non-pressure chronic ulcer of other part of right lower leg with fat layer 07/21/2021 No Yes exposed [place] (primary) hypertension 05/17/2021 No Yes Inactive Problems Resolved Problems Electronic Signature(s) Signed: 08/04/2021 11:13:59 AM By: Worthy Keeler PA-C Entered By: Worthy Keeler on 08/04/2021 11:13:58 [facility], [place] ([id number]) -------------------------------------------------------------------------------- Progress Note Details Patient Name: [name] Bean Date of Service: 08/04/2021  11:15 AM Medical Record Number: [id number] Patient Account Number: [id number] Date of Birth/Sex: 1935-06-07 (86 y.o. M) Treating RN: Cornell Barman Primary Care Provider: Harrel Lemon Other Clinician: Referring Provider: Harrel Lemon Treating Provider/Extender: Skipper Cliche in Treatment: 11 Subjective Chief Complaint Information obtained from Patient Bilateral LE Ulcers History of Present Illness (HPI) 05/17/2021 upon evaluation today patient presents for initial evaluation here in the clinic concerning issues that he has been healing. This again is not terribly uncommon especially with the significant swelling that he is experiencing. He does not have the compression wraps or compression stockings he tells me has never been told he should wear those but he definitely should be wearing those. Fortunately there does not appear to be any evidence of active infection at this time which is great news systemically though locally I do see some evidence of maceration and there is some question as to whether or not honestly he may have some infection present. He definitely has some slough and biofilm that needs to be cleared away. Either way I think with appropriate compression we can definitely get things moving in the right direction. Patient does have a history of chronic venous stasis, lymphedema, and his wounds unfortunately are right in the lower extremity on the left where he does have significant swelling and lymphedema. This means this can make things somewhat more difficult to heal in general. Other than this he also has a history of hypertension. 05/24/2021 upon evaluation today patient appears to be doing decently well in regard to his wound. He has been tolerating the dressing changes without complication. Fortunately there does not appear to be any evidence of active infection at this point which is great news and overall I am pleased in that regard. Nonetheless I do believe that the  patient is still having quite a bit of discomfort at least when I cleaned the wound but overall I think we are headed in the appropriate direction. No fevers, chills, nausea, vomiting, or diarrhea. 06/09/2021 upon evaluation today patient appears to be doing well with regard to  his wound. He has been tolerating the dressing changes without complication. Fortunately there does not appear to be any signs of active infection which is great news and overall very pleased with where we stand today. Nonetheless there is been made to be some sharp debridement performed today. Also think that the patient probably needs a stronger compression I would recommend that we increase to a 4-layer compression wrap. 06/23/2021 upon evaluation today patient appears to be doing better in regard to his leg ulcer. He has been tolerating the dressing changes without complication and overall I am extremely pleased with where we stand I think he is headed in the appropriate direction and this is healing quite nicely. 06/30/2021 upon evaluation today patient appears to be doing well with regard to his wound. In fact this is measuring significantly smaller and overall seems to be doing great. I am very pleased I am hopeful to get this done soon and he will be out of here. He is definitely ready to get out of the Wrap as this is causing a lot of problems with his knee. 07/07/2021 upon evaluation today patient's wound is actually showing signs of excellent improvement which is great news. Fortunately I think he is headed in the right direction and were getting closer to complete resolution. 07/14/2021 upon evaluation today patient appears to be doing well with regard to his leg ulcers. Fortunately I do not see any signs of active infection which is great news. Overall I think that he is actually doing quite well. 07/21/2021 upon evaluation today patient appears to be doing well with regard to his leg ulcers. He is showing signs of  improvement though this is slow to heal but still is making progress. Fortunately there is no signs of active infection systemically which is great news. No fevers, chills, nausea, vomiting, or diarrhea. 07/26/2021 upon evaluation today patient appears to be doing well with regard to his wounds though the collagen is starting to stick badly to the wound bed. I think that were probably can need to switch things up I would recommend Xeroform and give this a shot over the next week as I think this will keep it moist and from drying out. Patient is in agreement with that plan. Overall I am otherwise extremely happy with where we stand today. 08/04/2021 upon evaluation today patient appears to be pretty much healed based on what I am seeing currently. I do not see any signs of active infection at this point which is great news and overall I am extremely pleased with where we stand today. Objective [place], [place] ([id number]) Well-nourished and well-hydrated in no acute distress. Vitals Time Taken: 11:12 AM, Height: 73 in, Weight: 260 lbs, BMI: 34.3, Temperature: 98.2 F, Pulse: 77 bpm, Respiratory Rate: 18 breaths/min, Blood Pressure: 122/72 mmHg. Respiratory normal breathing without difficulty. Psychiatric this patient is able to make decisions and demonstrates good insight into disease process. Alert and Oriented x 3. pleasant and cooperative. General Notes: Upon inspection patient's wounds pretty much appear to be completely healed he has just a tiny area that might be leaking a little bit on the right leg but this is from intensive purposes closed as well. I think he just probably needs to keep his bandage over this and I will still update sexually not even truly a wound at this point. His left leg is completely healed. Integumentary (Hair, Skin) Wound #1 status is Open. Original cause of wound was Bump. The date acquired was: 05/03/2021. The  wound has been in treatment 11  weeks. The wound is located on the Left,Distal,Medial Lower Leg. The wound measures 0cm length x 0cm width x 0cm depth; 0cm^2 area and 0cm^3 volume. There is a medium amount of serosanguineous drainage noted. Wound #2 status is Open. Original cause of wound was Gradually Appeared. The date acquired was: 07/21/2021. The wound has been in treatment 2 weeks. The wound is located on the Right Lower Leg. The wound measures 0cm length x 0cm width x 0cm depth; 0cm^2 area and 0cm^3 volume. There is a medium amount of serosanguineous drainage noted. Wound #3 status is Healed - Epithelialized. Original cause of wound was Gradually Appeared. The date acquired was: 07/19/2021. The wound has been in treatment 1 weeks. The wound is located on the Left,Medial,Posterior Lower Leg. The wound measures 0cm length x 0cm width x 0cm depth; 0cm^2 area and 0cm^3 volume. There is a small amount of serous drainage noted. 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 Put garments on first thing when you wake up and remove them before bed. Moisturize legs daily after removing compression garments. Elevate, Exercise Daily and Avoid Standing for Long Periods of Time. 1. Would recommend currently based on what I am seeing that we go ahead and discontinue wound care services as the patient appears to be completely healed at this time. He will continue just to put a bandage over the right leg to make sure this does not continue to drain. Overall however is not wearing any compression at all think he is going to do that to be perfectly  honest. 2. Also can recommend he should be elevating his legs much as possible and to be honest he should be wearing his compression socks as well if he does not do this I cannot be certain that he is good actually showed signs of improvement in general. We will see patient back for reevaluation in 1 week here in the clinic. If anything worsens or changes patient will contact our office for additional recommendations. Electronic Signature(s) Signed: 08/04/2021 4:53:28 PM By: Worthy Keeler PA-C Entered By: Worthy Keeler on 08/04/2021 16:53:28 [facility], [place] ([id number]) -------------------------------------------------------------------------------- SuperBill Details Patient Name: [name] Bean Date of Service: 08/04/2021 Medical Record Number: [id number] Patient Account Number: [id number] Date of Birth/Sex: 12-04-1934 (86 y.o. M) Treating RN: Cornell Barman Primary Care Provider: Harrel Lemon Other Clinician: Referring Provider: Harrel Lemon Treating Provider/Extender: Skipper Cliche in Treatment: 11 Diagnosis Coding ICD-10 Codes Code Description I87.2 Venous insufficiency (chronic) (peripheral) I89.0 Lymphedema, not elsewhere classified L97.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 layer exposed L97.812 Non-pressure chronic ulcer of other part of right lower leg with fat layer exposed [place] (primary) hypertension Facility Procedures CPT4 Code: [id number] Description: 99213 - WOUND CARE VISIT-LEV 3 EST PT Modifier: Quantity: 1 Physician Procedures CPT4 Code: [id number] Description: 06237 - WC PHYS LEVEL 3 - EST PT Modifier: Quantity: 1 CPT4 Code: Description: ICD-10 Diagnosis Description I87.2 Venous insufficiency (chronic) (peripheral) I89.0 Lymphedema, not elsewhere classified L97.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 lay L97.812 Non-pressure chronic ulcer of  other part of right lower leg with fat  la Modifier: er exposed yer exposed Quantity: Engineer, maintenance) Signed: 08/04/2021 4:54:04 PM By: Worthy Keeler PA-C Entered By: Worthy Keeler on 08/04/2021 16:54:04

## 2021-08-04 NOTE — Progress Notes (Addendum)
WISE, FEES ([id number]) Visit Report for 08/04/2021 Arrival Information Details Patient Name: [name] Bean, [name] Bean Date of Service: 08/04/2021 11:15 AM Medical Record Number: [id number] Patient Account Number: [id number] Date of Birth/Sex: November 07, 1934 (86 y.o. M) Treating RN: [name] Bean Primary Care [name] Bean: [name] Bean Other Clinician: Referring [name] Bean: [name] Bean Treating [name] Bean/Extender: [name] Bean in Treatment: 11 Visit Information History Since Last Visit Added or deleted any medications: No Patient Arrived: [name] Bean Any new allergies or adverse reactions: No Arrival Time: 11:08 Had a fall or experienced change in No Accompanied By: self activities of daily living that may affect Transfer Assistance: None risk of falls: Patient Identification Verified: Yes Hospitalized since last visit: No Secondary Verification Process Completed: Yes Has Dressing in Place as Prescribed: Yes Patient Has Alerts: Yes Pain Present Now: Yes Patient Alerts: Patient on Blood Thinner Aspirin 81mg  Electronic Signature(s) Signed: 08/04/2021 11:53:14 AM By: [name] Bean, BSN, RN, CWS, Kim RN, BSN Entered By: [name] Bean, BSN, RN, CWS, [name] Bean on 08/04/2021 11:12:46 [name] Bean ([id number]) -------------------------------------------------------------------------------- Clinic Level of Care Assessment Details Patient Name: [name] Bean Date of Service: 08/04/2021 11:15 AM Medical Record Number: [id number] Patient Account Number: [id number] Date of Birth/Sex: 08/24/1935 (86 y.o. M) Treating RN: [name] Bean Primary Care [name] Bean: [name] Bean Other Clinician: Referring [name] Bean: [name] Bean Treating [name] Bean/Extender: [name] Bean in Treatment: 11 Clinic Level of Care Assessment Items TOOL 4 Quantity Score []  - Use when only an EandM is performed on FOLLOW-UP visit 0 ASSESSMENTS - Nursing Assessment / Reassessment X - Reassessment of Co-morbidities (includes updates in patient status) 1 10 X- 1  5 Reassessment of Adherence to Treatment Plan ASSESSMENTS - Wound and Skin Assessment / Reassessment []  - Simple Wound Assessment / Reassessment - one wound 0 X- 2 5 Complex Wound Assessment / Reassessment - multiple wounds []  - 0 Dermatologic / Skin Assessment (not related to wound area) ASSESSMENTS - Focused Assessment []  - Circumferential Edema Measurements - multi extremities 0 []  - 0 Nutritional Assessment / Counseling / Intervention []  - 0 Lower Extremity Assessment (monofilament, tuning fork, pulses) []  - 0 Peripheral Arterial Disease Assessment (using hand held doppler) ASSESSMENTS - Ostomy and/or Continence Assessment and Care []  - Incontinence Assessment and Management 0 []  - 0 Ostomy Care Assessment and Management (repouching, etc.) PROCESS - Coordination of Care X - Simple Patient / Family Education for ongoing care 1 15 []  - 0 Complex (extensive) Patient / Family Education for ongoing care []  - 0 Staff obtains Programmer, systems, Records, Test Results / Process Orders []  - 0 Staff telephones HHA, Nursing Homes / Clarify orders / etc []  - 0 Routine Transfer to another Facility (non-emergent condition) []  - 0 Routine Hospital Admission (non-emergent condition) []  - 0 New Admissions / Biomedical engineer / Ordering NPWT, Apligraf, etc. []  - 0 Emergency Hospital Admission (emergent condition) X- 1 10 Simple Discharge Coordination []  - 0 Complex (extensive) Discharge Coordination PROCESS - Special Needs []  - Pediatric / Minor Patient Management 0 []  - 0 Isolation Patient Management []  - 0 Hearing / Language / Visual special needs []  - 0 Assessment of Community assistance (transportation, D/C planning, etc.) []  - 0 Additional assistance / Altered mentation []  - 0 Support Surface(s) Assessment (bed, cushion, seat, etc.) INTERVENTIONS - Wound Cleansing / Measurement Biffle, Levis ([id number]) X- 1 5 Simple Wound Cleansing - one wound []  - 0 Complex Wound  Cleansing - multiple wounds X- 1 5 Wound Imaging (photographs - any number of wounds) []  - 0 Wound Tracing (instead of photographs) X- 1 5  Simple Wound Measurement - one wound []  - 0 Complex Wound Measurement - multiple wounds INTERVENTIONS - Wound Dressings X - Small Wound Dressing one or multiple wounds 1 10 []  - 0 Medium Wound Dressing one or multiple wounds []  - 0 Large Wound Dressing one or multiple wounds []  - 0 Application of Medications - topical []  - 0 Application of Medications - injection INTERVENTIONS - Miscellaneous []  - External ear exam 0 []  - 0 Specimen Collection (cultures, biopsies, blood, body fluids, etc.) []  - 0 Specimen(s) / Culture(s) sent or taken to Lab for analysis []  - 0 Patient Transfer (multiple staff / Civil Service fast streamer / Similar devices) []  - 0 Simple Staple / Suture removal (25 or less) []  - 0 Complex Staple / Suture removal (26 or more) []  - 0 Hypo / Hyperglycemic Management (close monitor of Blood Glucose) []  - 0 Ankle / Brachial Index (ABI) - do not check if billed separately X- 1 5 Vital Signs Has the patient been seen at the hospital within the last three years: Yes Total Score: 80 Level Of Care: New/Established - Level 3 Electronic Signature(s) Signed: 08/04/2021 11:53:14 AM By: [name] Bean, BSN, RN, CWS, Kim RN, BSN Entered By: [name] Bean, BSN, RN, CWS, [name] Bean on 08/04/2021 11:48:59 [name] Bean ([id number]) -------------------------------------------------------------------------------- Encounter Discharge Information Details Patient Name: [name] Bean Date of Service: 08/04/2021 11:15 AM Medical Record Number: [id number] Patient Account Number: [id number] Date of Birth/Sex: 06/18/1935 (86 y.o. M) Treating RN: [name] Bean Primary Care [name] Bean: [name] Bean Other Clinician: Referring [name] Bean: [name] Bean Treating [name] Bean/Extender: [name] Bean in Treatment: 11 Encounter Discharge Information Items Discharge Condition:  Stable Ambulatory Status: Walker Discharge Destination: Home Transportation: Private Auto Accompanied By: self Schedule Follow-up Appointment: No Clinical Summary of Care: Electronic Signature(s) Signed: 08/04/2021 11:53:14 AM By: [name] Bean, BSN, RN, CWS, Kim RN, BSN Entered By: [name] Bean, BSN, RN, CWS, [name] Bean on 08/04/2021 11:50:47 [name] Bean ([id number]) -------------------------------------------------------------------------------- Lower Extremity Assessment Details Patient Name: [name] Bean Date of Service: 08/04/2021 11:15 AM Medical Record Number: [id number] Patient Account Number: [id number] Date of Birth/Sex: [birth year] (86 y.o. M) Treating RN: [name] Bean Primary Care Sharada Albornoz: [name] Bean Other Clinician: Referring Anmarie Fukushima: [name] Bean Treating Erionna Strum/Extender: Jeri Cos Weeks in Treatment: 11 Edema Assessment Assessed: [Left: No] [Right: No] [Left: Edema] [Right: :] Calf Left: Right: Point of Measurement: 40 cm From Medial Instep 43.5 cm Ankle Left: Right: Point of Measurement: 14 cm From Medial Instep 28 cm Vascular Assessment Pulses: Dorsalis Pedis Palpable: [Left:Yes] Electronic Signature(s) Signed: 08/04/2021 11:53:14 AM By: [name] Bean, BSN, RN, CWS, Kim RN, BSN Entered By: [name] Bean, BSN, RN, CWS, [name] Bean on 08/04/2021 11:28:09 [name] Bean ([id number]) -------------------------------------------------------------------------------- Multi Wound Chart Details Patient Name: [name] Bean Date of Service: 08/04/2021 11:15 AM Medical Record Number: [id number] Patient Account Number: [id number] Date of Birth/Sex: 1935/03/03 (86 y.o. M) Treating RN: [name] Bean Primary Care Sagal Gayton: [name] Bean Other Clinician: Referring Vanesa Renier: [name] Bean Treating Aryana Wonnacott/Extender: Jeri Cos Weeks in Treatment: 11 Vital Signs Height(in): 73 Pulse(bpm): 43 Weight(lbs): 260 Blood Pressure(mmHg): 122/72 Body Mass Index(BMI): 34 Temperature(F): 98.2 Respiratory  Rate(breaths/min): 18 Photos: Wound Location: Left, Distal, Medial Lower Leg Right Lower Leg Left, Medial, Posterior Lower Leg Wounding Event: Bump Gradually Appeared Gradually Appeared Primary Etiology: Trauma, Other Venous Leg Ulcer Venous Leg Ulcer Secondary Etiology: Lymphedema N/A N/A Date Acquired: 05/03/2021 07/21/2021 07/19/2021 Weeks of Treatment: 11 2 1  Wound Status: Open Open Open Clustered Wound: Yes No No Measurements L x W x D (cm) 0x0x0 0x0x0 0.5x0.2x0.1 Area (cm) : 0 0 0.079 Volume (cm) :  0 0 0.008 % Reduction in Area: 100.00% 100.00% 68.50% % Reduction in Volume: 100.00% 100.00% 68.00% Classification: Full Thickness Without Exposed Partial Thickness Partial Thickness Support Structures Exudate Amount: Medium Medium Small Exudate Type: Serosanguineous Serosanguineous Serous Exudate Color: red, brown red, brown amber Treatment Notes Electronic Signature(s) Signed: 08/04/2021 11:53:14 AM By: [name] Bean, BSN, RN, CWS, Kim RN, BSN Entered By: [name] Bean, BSN, RN, CWS, [name] Bean on 08/04/2021 11:40:31 [name] Bean ([id number]) -------------------------------------------------------------------------------- Multi-Disciplinary Care Plan Details Patient Name: [name] Bean Date of Service: 08/04/2021 11:15 AM Medical Record Number: [id number] Patient Account Number: [id number] Date of Birth/Sex: [birth year] (86 y.o. M) Treating RN: [name] Bean Primary Care Ayodeji Keimig: [name] Bean Other Clinician: Referring Rashaun Curl: [name] Bean Treating Pansy Ostrovsky/Extender: [name] Bean in Treatment: 11 Active Inactive Electronic Signature(s) Signed: 08/12/2021 9:41:51 AM By: [name] Bean, BSN, RN, CWS, Kim RN, BSN Previous Signature: 08/04/2021 11:53:14 AM Version By: [name] Bean, BSN, RN, CWS, Kim RN, BSN Entered By: [name] Bean, BSN, RN, CWS, [name] Bean on 08/12/2021 09:41:51 [name] Bean ([id number]) -------------------------------------------------------------------------------- Pain Assessment Details Patient Name:  [name] Bean Date of Service: 08/04/2021 11:15 AM Medical Record Number: [id number] Patient Account Number: [id number] Date of Birth/Sex: 1935-03-03 (86 y.o. M) Treating RN: [name] Bean Primary Care Teyonna Plaisted: [name] Bean Other Clinician: Referring Ulysse Siemen: [name] Bean Treating Davina Howlett/Extender: [name] Bean in Treatment: 11 Active Problems Location of Pain Severity and Description of Pain Patient Has Paino Yes Site Locations Rate the pain. Current Pain Level: 7 Worst Pain Level: 10 Least Pain Level: 6 Pain Management and Medication Current Pain Management: Electronic Signature(s) Signed: 08/04/2021 11:53:14 AM By: [name] Bean, BSN, RN, CWS, Kim RN, BSN Entered By: [name] Bean, BSN, RN, CWS, [name] Bean on 08/04/2021 11:13:57 [name] Bean ([id number]) -------------------------------------------------------------------------------- Patient/Caregiver Education Details Patient Name: [name] Bean Date of Service: 08/04/2021 11:15 AM Medical Record Number: [id number] Patient Account Number: [id number] Date of Birth/Gender: June 26, 1935 (86 y.o. M) Treating RN: [name] Bean Primary Care Physician: [name] Bean Other Clinician: Referring Physician: Harrel Bean Treating Physician/Extender: [name] Bean in Treatment: 11 Education Assessment Education Provided To: Patient Education Topics Provided Venous: Handouts: Controlling Swelling with Compression Stockings Methods: Explain/Verbal Responses: State content correctly [employer]) Signed: 08/04/2021 11:53:14 AM By: [name] Bean, BSN, RN, CWS, Kim RN, BSN Entered By: [name] Bean, BSN, RN, CWS, [name] Bean on 08/04/2021 11:49:59 [name] Bean ([id number]) -------------------------------------------------------------------------------- Wound Assessment Details Patient Name: [name] Bean Date of Service: 08/04/2021 11:15 AM Medical Record Number: [id number] Patient Account Number: [id number] Date of Birth/Sex: [birth year] (86 y.o. M) Treating RN: [name] Bean Primary Care Uri Covey: [name] Bean Other Clinician: Referring Rio Kidane: [name] Bean Treating Kaiven Vester/Extender: Jeri Cos Weeks in Treatment: 11 Wound Status Wound Number: 1 Primary Etiology: Trauma, Other Wound Location: Left, Distal, Medial Lower Leg Secondary Etiology: Lymphedema Wounding Event: Bump Wound Status: Open Date Acquired: 05/03/2021 Weeks Of Treatment: 11 Clustered Wound: Yes Photos Photo Uploaded By: [name] Bean, BSN, RN, CWS, [name] Bean on 08/04/2021 11:26:26 Wound Measurements Length: (cm) 0 Width: (cm) 0 Depth: (cm) 0 Area: (cm) Volume: (cm) % Reduction in Area: 100% % Reduction in Volume: 100% 0 0 Wound Description Classification: Full Thickness Without Exposed Support Structu Exudate Amount: Medium Exudate Type: Serosanguineous Exudate Color: red, brown res Electronic Signature(s) Signed: 08/04/2021 11:53:14 AM By: [name] Bean, BSN, RN, CWS, Kim RN, BSN Entered By: [name] Bean, BSN, RN, CWS, [name] Bean on 08/04/2021 11:25:33 [name] Bean ([id number]) -------------------------------------------------------------------------------- Wound Assessment Details Patient Name: [name] Bean Date of Service: 08/04/2021 11:15 AM Medical Record Number: [id number] Patient Account Number: [id number] Date of Birth/Sex: 1935-02-07 (86 y.o. M) Treating RN: [name] Bean Primary Care Ahsley Attwood: Edwina Barth,  John Other Clinician: Referring Ezekeil Bethel: [name] Bean Treating Lorelie Biermann/Extender: Jeri Cos Weeks in Treatment: 11 Wound Status Wound Number: 2 Primary Etiology: Venous Leg Ulcer Wound Location: Right Lower Leg Wound Status: Open Wounding Event: Gradually Appeared Date Acquired: 07/21/2021 Weeks Of Treatment: 2 Clustered Wound: No Photos Photo Uploaded By: [name] Bean, BSN, RN, CWS, [name] Bean on 08/04/2021 11:26:27 Wound Measurements Length: (cm) 0 Width: (cm) 0 Depth: (cm) 0 Area: (cm) 0 Volume: (cm) 0 % Reduction in Area: 100% % Reduction in Volume: 100% Wound  Description Classification: Partial Thickness Exudate Amount: Medium Exudate Type: Serosanguineous Exudate Color: red, brown Electronic Signature(s) Signed: 08/04/2021 11:53:14 AM By: [name] Bean, BSN, RN, CWS, Kim RN, BSN Entered By: [name] Bean, BSN, RN, CWS, [name] Bean on 08/04/2021 11:25:33 [name] Bean ([id number]) -------------------------------------------------------------------------------- Wound Assessment Details Patient Name: [name] Bean Date of Service: 08/04/2021 11:15 AM Medical Record Number: [id number] Patient Account Number: [id number] Date of Birth/Sex: 07/06/1935 (86 y.o. M) Treating RN: [name] Bean Primary Care Gevon Markus: [name] Bean Other Clinician: Referring Beckie Viscardi: [name] Bean Treating Jony Ladnier/Extender: Jeri Cos Weeks in Treatment: 11 Wound Status Wound Number: 3 Primary Etiology: Venous Leg Ulcer Wound Location: Left, Medial, Posterior Lower Leg Wound Status: Healed - Epithelialized Wounding Event: Gradually Appeared Date Acquired: 07/19/2021 Weeks Of Treatment: 1 Clustered Wound: No Photos Photo Uploaded By: [name] Bean, BSN, RN, CWS, [name] Bean on 08/04/2021 11:26:39 Wound Measurements Length: (cm) 0 Width: (cm) 0 Depth: (cm) 0 Area: (cm) 0 Volume: (cm) 0 % Reduction in Area: 100% % Reduction in Volume: 100% Wound Description Classification: Partial Thickness Exudate Amount: Small Exudate Type: Serous Exudate Color: Publishing copy Signature(s) Signed: 08/04/2021 11:53:14 AM By: [name] Bean, BSN, RN, CWS, Kim RN, BSN Entered By: [name] Bean, BSN, RN, CWS, [name] Bean on 08/04/2021 11:46:15 [name] Bean ([id number]) -------------------------------------------------------------------------------- Vitals Details Patient Name: [name] Bean Date of Service: 08/04/2021 11:15 AM Medical Record Number: [id number] Patient Account Number: [id number] Date of Birth/Sex: 18-Feb-1935 (86 y.o. M) Treating RN: [name] Bean Primary Care Melondy Blanchard: [name] Bean Other Clinician: Referring Izumi Mixon:  [name] Bean Treating Perlie Stene/Extender: [name] Bean in Treatment: 11 Vital Signs Time Taken: 11:12 Temperature (F): 98.2 Height (in): 73 Pulse (bpm): 77 Weight (lbs): 260 Respiratory Rate (breaths/min): 18 Body Mass Index (BMI): 34.3 Blood Pressure (mmHg): 122/72 Reference Range: 80 - 120 mg / dl Electronic Signature(s) Signed: 08/04/2021 11:53:14 AM By: [name] Bean, BSN, RN, CWS, Kim RN, BSN Entered By: [name] Bean, BSN, RN, CWS, [name] Bean on 08/04/2021 11:13:11

## 2021-08-10 ENCOUNTER — Encounter: Payer: Self-pay | Admitting: Urology

## 2021-08-10 ENCOUNTER — Other Ambulatory Visit: Payer: Self-pay | Admitting: Urology

## 2021-08-10 ENCOUNTER — Ambulatory Visit (INDEPENDENT_AMBULATORY_CARE_PROVIDER_SITE_OTHER): Payer: Medicare Other | Admitting: Urology

## 2021-08-10 ENCOUNTER — Other Ambulatory Visit: Payer: Self-pay

## 2021-08-10 VITALS — BP 153/79 | HR 84 | Ht 72.0 in | Wt 250.0 lb

## 2021-08-10 DIAGNOSIS — R3915 Urgency of urination: Secondary | ICD-10-CM

## 2021-08-10 DIAGNOSIS — R339 Retention of urine, unspecified: Secondary | ICD-10-CM

## 2021-08-10 DIAGNOSIS — R350 Frequency of micturition: Secondary | ICD-10-CM | POA: Diagnosis not present

## 2021-08-10 DIAGNOSIS — R300 Dysuria: Secondary | ICD-10-CM | POA: Diagnosis not present

## 2021-08-10 DIAGNOSIS — N3941 Urge incontinence: Secondary | ICD-10-CM | POA: Diagnosis not present

## 2021-08-10 LAB — MICROSCOPIC EXAMINATION: RBC, Urine: NONE SEEN /hpf (ref 0–2)

## 2021-08-10 LAB — URINALYSIS, COMPLETE
Bilirubin, UA: NEGATIVE
Glucose, UA: NEGATIVE
Ketones, UA: NEGATIVE
Nitrite, UA: NEGATIVE
Protein,UA: NEGATIVE
Specific Gravity, UA: 1.01 (ref 1.005–1.030)
Urobilinogen, Ur: 0.2 mg/dL (ref 0.2–1.0)
pH, UA: 7 (ref 5.0–7.5)

## 2021-08-10 LAB — BLADDER SCAN AMB NON-IMAGING: Scan Result: 121

## 2021-08-10 MED ORDER — GEMTESA 75 MG PO TABS: 75.0000 mg | ORAL_TABLET | Freq: Every day | ORAL | 0 refills | Status: DC

## 2021-08-10 MED ORDER — CIPROFLOXACIN HCL 500 MG PO TABS: 500.0000 mg | ORAL_TABLET | Freq: Two times a day (BID) | ORAL | 0 refills | Status: DC

## 2021-08-10 NOTE — Progress Notes (Signed)
08/10/2021 3:49 PM   [name] [birth year] [id number]  Referring provider: Baxter Hire, MD [place],  [place] [address]  Chief Complaint  Patient presents with   Urinary Frequency    Urologic history: 1.  Lower urinary tract symptoms - Chronic urinary frequency, urgency, urge incontinence - No significant provement tamsulosin, oxybutynin and Myrbetriq - Cystoscopy 2020 with meatal stricture.  TUR changes with adenoma regrowth -Cystoscopy 01/2021 without stricture   2.  Chronic bacteriuria - Only complains of malodorous urine   3.  History of hypogonadism - Previously on TRT  HPI: 86 y.o. male presents for an acute appointment with worsening LUTS.  1 week history of worsening frequency, urgency and dysuria No fever, chills or gross hematuria Occasional episodes of urge incontinence  PMH: Past Medical History:  Diagnosis Date   Arthritis    left knee;    Hyperlipidemia    Urethral stricture     Surgical History: No past surgical history on file.  Home Medications:  Allergies as of 08/10/2021       Reactions   Amoxicillin Swelling   Amoxicillin-pot Clavulanate Hives, Shortness Of Breath, Swelling   Pt states he had to go to the hospital because of this situation Pt states he had to go to the hospital because of this situation   Mecobalamin Nausea Only        Medication List        Accurate as of August 10, 2021  3:49 PM. If you have any questions, ask your nurse or doctor.          STOP taking these medications    oxybutynin 10 MG 24 hr tablet Commonly known as: DITROPAN-XL Stopped by: Abbie Sons, MD       TAKE these medications    albuterol 108 (90 Base) MCG/ACT inhaler Commonly known as: VENTOLIN HFA Inhale into the lungs.   aspirin EC 81 MG tablet Take 81 mg by mouth.   augmented betamethasone dipropionate 0.05 % ointment Commonly known as: DIPROLENE-AF APPLY TO AFFECTED AREAS TWICE DAILY UNTIL  CLEAR   azelastine 0.1 % nasal spray Commonly known as: ASTELIN SPRAY ONCE IN EACH NOSTRIL TWICE DAILY   bisacodyl 5 MG EC tablet Commonly known as: DULCOLAX daily.   ciprofloxacin 500 MG tablet Commonly known as: CIPRO Take 1 tablet (500 mg total) by mouth every 12 (twelve) hours. Started by: Abbie Sons, MD   diclofenac Sodium 1 % Gel Commonly known as: VOLTAREN APPLY 4 GRAMS TO AFFECTED AREA 4 TIMES A DAY   docusate sodium 100 MG capsule Commonly known as: COLACE Take by mouth.   doxycycline 100 MG capsule Commonly known as: VIBRAMYCIN Take 100 mg by mouth 2 (two) times daily.   DULoxetine 20 MG capsule Commonly known as: CYMBALTA Take 20 mg by mouth daily.   fluticasone 50 MCG/ACT nasal spray Commonly known as: FLONASE SPRAY 2 SPRAYS INTO EACH NOSTRIL EVERY DAY   gabapentin 300 MG capsule Commonly known as: NEURONTIN Take 300 mg by mouth 2 (two) times daily.   Gemtesa 75 MG Tabs Generic drug: Vibegron Take 75 mg by mouth daily. Started by: Abbie Sons, MD   loratadine 10 MG tablet Commonly known as: CLARITIN TAKE 1 TABLET (10 MG TOTAL) BY MOUTH ONCE DAILY. (NOT COVER)   meloxicam 15 MG tablet Commonly known as: MOBIC Take 15 mg by mouth daily.   methenamine 1 g tablet Commonly known as: HIPREX Take 1 tablet (1 g  total) by mouth 2 (two) times daily with a meal.   metolazone 2.5 MG tablet Commonly known as: ZAROXOLYN Take 2.5 mg by mouth every other day.   mupirocin ointment 2 % Commonly known as: BACTROBAN APPLY TO AFFECTED AREA 3 TIMES A DAY FOR 7 DAYS   omeprazole 40 MG capsule Commonly known as: PRILOSEC Take 40 mg by mouth daily.   oxyCODONE-acetaminophen 10-325 MG tablet Commonly known as: PERCOCET Take 1 tablet by mouth 4 (four) times daily as needed.   potassium chloride SA 20 MEQ tablet Commonly known as: KLOR-CON M TAKE 1 TABLET (20 MEQ TOTAL) BY MOUTH 3 (THREE) TIMES DAILY.   tamsulosin 0.4 MG Caps capsule Commonly  known as: FLOMAX Take 0.4 mg by mouth daily.   Testosterone 10 MG/ACT (2%) Gel Apply 20 mg topically daily.   torsemide 20 MG tablet Commonly known as: DEMADEX Take 1 tablet by mouth daily.   triamcinolone 0.025 % cream Commonly known as: KENALOG triamcinolone acetonide 0.025 % topical cream   Vitamin D3 25 MCG (1000 UT) Caps Take by mouth.        Allergies:  Allergies  Allergen Reactions   Amoxicillin Swelling   Amoxicillin-Pot Clavulanate Hives, Shortness Of Breath and Swelling    Pt states he had to go to the hospital because of this situation Pt states he had to go to the hospital because of this situation    Mecobalamin Nausea Only    Family History: No family history on file.  Social History:  reports that he quit smoking about 40 years ago. His smoking use included cigarettes. He has never used smokeless tobacco. He reports that he does not drink alcohol and does not use drugs.   Physical Exam: BP (!) 153/79   Pulse 84   Ht 6' (1.829 m)   Wt 250 lb (113.4 kg)   BMI 33.91 kg/m   Constitutional:  Alert and oriented, No acute distress. HEENT: [place] AT, moist mucus membranes.  Trachea midline, no masses. Cardiovascular: No clubbing, cyanosis, or edema. Respiratory: Normal respiratory effort, no increased work of breathing. GI: Abdomen is soft, nontender, nondistended, no abdominal masses GU: No CVA tenderness Skin: No rashes, bruises or suspicious lesions. Neurologic: Grossly intact, no focal deficits, moving all 4 extremities. Psychiatric: Normal mood and affect.  Laboratory Data:  Urinalysis Dipstick trace blood/1+ leukocytes Microscopy 11-30 WBC, moderate bacteria  Assessment & Plan:    1. Urinary frequency, urgency, urge incontinence Bladder scan PVR 121 mL Urinalysis with pyuria and symptoms most likely secondary to UTI Urine culture ordered Based on last culture and allergies Rx Cipro 500 mg twice daily sent to pharmacy Call for worsening  symptoms or development of fever  2. Dysuria As above   Abbie Sons, [place] [address] [place], [place] [address] [phone]

## 2021-08-13 LAB — CULTURE, URINE COMPREHENSIVE

## 2021-08-15 ENCOUNTER — Telehealth: Payer: Self-pay | Admitting: *Deleted

## 2021-08-15 NOTE — Telephone Encounter (Signed)
-----   Message from Abbie Sons, MD sent at 08/13/2021  9:53 AM EST ----- Urine culture was + and sensitive to the prescribed antibiotic

## 2021-08-15 NOTE — Telephone Encounter (Signed)
Notified patient as instructed, patient pleased. Discussed follow-up appointments, patient agrees  

## 2021-08-30 ENCOUNTER — Other Ambulatory Visit (INDEPENDENT_AMBULATORY_CARE_PROVIDER_SITE_OTHER): Payer: Self-pay | Admitting: Nurse Practitioner

## 2021-08-30 ENCOUNTER — Ambulatory Visit (INDEPENDENT_AMBULATORY_CARE_PROVIDER_SITE_OTHER): Payer: Medicare Other | Admitting: Vascular Surgery

## 2021-08-30 ENCOUNTER — Other Ambulatory Visit: Payer: Self-pay

## 2021-08-30 ENCOUNTER — Encounter (INDEPENDENT_AMBULATORY_CARE_PROVIDER_SITE_OTHER): Payer: Self-pay | Admitting: Vascular Surgery

## 2021-08-30 ENCOUNTER — Ambulatory Visit (INDEPENDENT_AMBULATORY_CARE_PROVIDER_SITE_OTHER): Payer: Medicare Other

## 2021-08-30 ENCOUNTER — Other Ambulatory Visit (INDEPENDENT_AMBULATORY_CARE_PROVIDER_SITE_OTHER): Payer: Self-pay | Admitting: Surgery

## 2021-08-30 VITALS — BP 147/85 | HR 73 | Resp 17 | Ht 73.0 in | Wt 253.0 lb

## 2021-08-30 DIAGNOSIS — I723 Aneurysm of iliac artery: Secondary | ICD-10-CM

## 2021-08-30 DIAGNOSIS — R609 Edema, unspecified: Secondary | ICD-10-CM | POA: Diagnosis not present

## 2021-08-30 DIAGNOSIS — R600 Localized edema: Secondary | ICD-10-CM | POA: Diagnosis not present

## 2021-08-30 DIAGNOSIS — E785 Hyperlipidemia, unspecified: Secondary | ICD-10-CM

## 2021-08-30 DIAGNOSIS — L97221 Non-pressure chronic ulcer of left calf limited to breakdown of skin: Secondary | ICD-10-CM

## 2021-08-30 DIAGNOSIS — S80822A Blister (nonthermal), left lower leg, initial encounter: Secondary | ICD-10-CM | POA: Diagnosis not present

## 2021-08-30 DIAGNOSIS — I10 Essential (primary) hypertension: Secondary | ICD-10-CM | POA: Diagnosis not present

## 2021-08-30 DIAGNOSIS — I739 Peripheral vascular disease, unspecified: Secondary | ICD-10-CM

## 2021-08-30 DIAGNOSIS — I890 Lymphedema, not elsewhere classified: Secondary | ICD-10-CM

## 2021-08-30 NOTE — Progress Notes (Signed)
Patient ID: [name] Bean, male   DOB: 09-Sep-1934, 86 y.o.   MRN: [id number]  Chief Complaint  Patient presents with   New Patient (Initial Visit)    ABI , ultrasound    HPI [name] Bean is a 86 y.o. male.  I am asked to see the patient by Dr. Roland Rack for evaluation of left leg swelling and ulceration.  We had seen the patient previously back in 2018 for iliac artery aneurysms.  Our last note says he was moving and we have not seen him since then.  It does not appear as if his iliac artery aneurysms have been checked in the last couple of years although I do not know for sure.  He does not recall it being checked.  His issue now is of nonhealing ulcerations on the left calf and lower leg as well as marked leg swelling.  He needs his left knee replaced for severe degenerative arthritis, but with the ulceration and swelling that has had to be put on hold.  He apparently at one point was seeing the wound center but has not seen them recently.  He still has weeping ulceration.  He has a self applied Ace bandage on the left leg.  He has right leg stasis dermatitis and significant swelling although not as bad as the left.  No fevers or chills or signs of systemic infection.  No chest pain or shortness of breath.  To assess his arterial perfusion for wound healing, ABIs were done today and were normal at 1.23 on the right and 1.26 on the left with triphasic waveforms and normal digital pressures consistent with no arterial insufficiency.     Past Medical History:  Diagnosis Date   Arthritis    left knee;    Hyperlipidemia    Urethral stricture     No past surgical history on file.   Family History  Problem Relation Age of Onset   Diabetes Brother   No bleeding disorders, clotting disorders, autoimmune diseases, or aneurysms   Social History   Tobacco Use   Smoking status: Former    Types: Cigarettes    Quit date: 1982    Years since quitting: 41.0   Smokeless tobacco: Never  Vaping Use    Vaping Use: Never used  Substance Use Topics   Alcohol use: No   Drug use: No     Allergies  Allergen Reactions   Amoxicillin Swelling   Amoxicillin-Pot Clavulanate Hives, Shortness Of Breath and Swelling    Pt states he had to go to the hospital because of this situation Pt states he had to go to the hospital because of this situation    Mecobalamin Nausea Only    Current Outpatient Medications  Medication Sig Dispense Refill   albuterol (PROVENTIL HFA;VENTOLIN HFA) 108 (90 Base) MCG/ACT inhaler Inhale into the lungs.     aspirin EC 81 MG tablet Take 81 mg by mouth.     augmented betamethasone dipropionate (DIPROLENE-AF) 0.05 % ointment APPLY TO AFFECTED AREAS TWICE DAILY UNTIL CLEAR  2   azelastine (ASTELIN) 0.1 % nasal spray SPRAY ONCE IN EACH NOSTRIL TWICE DAILY     bisacodyl (DULCOLAX) 5 MG EC tablet daily.     Cholecalciferol (VITAMIN D3) 1000 units CAPS Take by mouth.     ciprofloxacin (CIPRO) 500 MG tablet Take 1 tablet (500 mg total) by mouth every 12 (twelve) hours. 14 tablet 0   diclofenac Sodium (VOLTAREN) 1 % GEL APPLY 4 GRAMS TO  AFFECTED AREA 4 TIMES A DAY     docusate sodium (COLACE) 100 MG capsule Take by mouth.     fluticasone (FLONASE) 50 MCG/ACT nasal spray SPRAY 2 SPRAYS INTO EACH NOSTRIL EVERY DAY  2   loratadine (CLARITIN) 10 MG tablet TAKE 1 TABLET (10 MG TOTAL) BY MOUTH ONCE DAILY. (NOT COVER)  11   meloxicam (MOBIC) 15 MG tablet Take 15 mg by mouth daily.     metolazone (ZAROXOLYN) 2.5 MG tablet Take 2.5 mg by mouth every other day.     mupirocin ointment (BACTROBAN) 2 % APPLY TO AFFECTED AREA 3 TIMES A DAY FOR 7 DAYS     omeprazole (PRILOSEC) 40 MG capsule Take 40 mg by mouth daily.     oxyCODONE-acetaminophen (PERCOCET) 10-325 MG tablet Take 1 tablet by mouth 4 (four) times daily as needed.     potassium chloride SA (KLOR-CON) 20 MEQ tablet TAKE 1 TABLET (20 MEQ TOTAL) BY MOUTH 3 (THREE) TIMES DAILY.     tamsulosin (FLOMAX) 0.4 MG CAPS capsule Take  0.4 mg by mouth daily.     Testosterone 10 MG/ACT (2%) GEL Apply 20 mg topically daily.     torsemide (DEMADEX) 20 MG tablet Take 1 tablet by mouth daily.     triamcinolone (KENALOG) 0.025 % cream triamcinolone acetonide 0.025 % topical cream     doxycycline (VIBRAMYCIN) 100 MG capsule Take 100 mg by mouth 2 (two) times daily. (Patient not taking: Reported on 08/30/2021)     DULoxetine (CYMBALTA) 20 MG capsule Take 20 mg by mouth daily. (Patient not taking: Reported on 08/30/2021)     gabapentin (NEURONTIN) 300 MG capsule Take 300 mg by mouth 2 (two) times daily. (Patient not taking: Reported on 08/30/2021)     methenamine (HIPREX) 1 g tablet Take 1 tablet (1 g total) by mouth 2 (two) times daily with a meal. (Patient not taking: Reported on 08/30/2021) 60 tablet 3   Vibegron (GEMTESA) 75 MG TABS Take 75 mg by mouth daily. (Patient not taking: Reported on 08/30/2021) 28 tablet 0   No current facility-administered medications for this visit.      REVIEW OF SYSTEMS (Negative unless checked)  Constitutional: [] Weight loss  [] Fever  [] Chills Cardiac: [] Chest pain   [] Chest pressure   [] Palpitations   [] Shortness of breath when laying flat   [] Shortness of breath at rest   [x] Shortness of breath with exertion. Vascular:  [] Pain in legs with walking   [] Pain in legs at rest   [] Pain in legs when laying flat   [] Claudication   [] Pain in feet when walking  [] Pain in feet at rest  [] Pain in feet when laying flat   [] History of DVT   [] Phlebitis   [x] Swelling in legs   [] Varicose veins   [x] Non-healing ulcers Pulmonary:   [] Uses home oxygen   [] Productive cough   [] Hemoptysis   [] Wheeze  [] COPD   [] Asthma Neurologic:  [] Dizziness  [] Blackouts   [] Seizures   [] History of stroke   [] History of TIA  [] Aphasia   [] Temporary blindness   [] Dysphagia   [] Weakness or numbness in arms   [] Weakness or numbness in legs Musculoskeletal:  [x] Arthritis   [] Joint swelling   [x] Joint pain   [] Low back pain Hematologic:   [] Easy bruising  [] Easy bleeding   [] Hypercoagulable state   [] Anemic  [] Hepatitis Gastrointestinal:  [] Blood in stool   [] Vomiting blood  [] Gastroesophageal reflux/heartburn   [] Abdominal pain Genitourinary:  [] Chronic kidney disease   [] Difficult urination  []   Frequent urination  [] Burning with urination   [] Hematuria Skin:  [] Rashes   [x] Ulcers   [x] Wounds Psychological:  [] History of anxiety   []  History of major depression.    Physical Exam BP (!) 147/85 (BP Location: Right Arm)    Pulse 73    Resp 17    Ht 6\' 1"  (1.854 m)    Wt 253 lb (114.8 kg)    BMI 33.38 kg/m  Gen:  WD/WN, NAD.  Appears younger than stated age Head: [place]/AT, No temporalis wasting.  Ear/Nose/Throat: Hearing grossly intact, nares w/o erythema or drainage, oropharynx w/o Erythema/Exudate Eyes: Conjunctiva clear, sclera non-icteric  Neck: trachea midline.  No JVD.  Pulmonary:  Good air movement, respirations not labored, no use of accessory muscles  Cardiac: RRR, no JVD Vascular:  Vessel Right Left  Radial Palpable Palpable                          DP 1+ NP  PT NP NP   Gastrointestinal:. No masses, surgical incisions, or scars. Musculoskeletal: M/S 5/5 throughout.  Extremities without ischemic changes.  No deformity or atrophy.  2+ right lower extremity edema, 3-4+ left lower extremity edema.  Superficial wounds on the left calf and lower leg.  Moderate stasis dermatitis changes present bilaterally Neurologic: Sensation grossly intact in extremities.  Symmetrical.  Speech is fluent. Motor exam as listed above. Psychiatric: Judgment intact, Mood & affect appropriate for pt's clinical situation. Dermatologic: Left calf wounds as described above    Radiology No results found.  Labs Recent Results (from the past 2160 hour(s))  CULTURE, URINE COMPREHENSIVE     Status: Abnormal   Collection Time: 06/10/21  8:52 AM   Specimen: Urine   UR  Result Value Ref Range   Urine Culture, Comprehensive Final report  (A)    Organism ID, Bacteria Escherichia coli (A)     Comment: Cefazolin <=4 ug/mL Cefazolin with an MIC <=16 predicts susceptibility to the oral agents cefaclor, cefdinir, cefpodoxime, cefprozil, cefuroxime, cephalexin, and loracarbef when used for therapy of uncomplicated urinary tract infections due to E. coli, Klebsiella pneumoniae, and Proteus mirabilis. Greater than 100,000 colony forming units per mL    ANTIMICROBIAL SUSCEPTIBILITY Comment     Comment:       ** S = Susceptible; I = Intermediate; R = Resistant **                    P = Positive; N = Negative             MICS are expressed in micrograms per mL    Antibiotic                 RSLT#1    RSLT#2    RSLT#3    RSLT#4 Amoxicillin/Clavulanic Acid    S Ampicillin                     S Cefepime                       S Ceftriaxone                    S Cefuroxime                     S Ciprofloxacin                  S Ertapenem  S Gentamicin                     S Imipenem                       S Levofloxacin                   S Meropenem                      S Nitrofurantoin                 S Piperacillin/Tazobactam        S Tetracycline                   R Tobramycin                     S Trimethoprim/Sulfa             R   Urinalysis, Complete     Status: Abnormal   Collection Time: 06/10/21  8:52 AM  Result Value Ref Range   Specific Gravity, UA 1.020 1.005 - 1.030   pH, UA 7.0 5.0 - 7.5   Color, UA Yellow Yellow   Appearance Ur Cloudy (A) Clear   Leukocytes,UA 1+ (A) Negative   Protein,UA Negative Negative/Trace   Glucose, UA Negative Negative   Ketones, UA Negative Negative   RBC, UA Trace (A) Negative   Bilirubin, UA Negative Negative   Urobilinogen, Ur 0.2 0.2 - 1.0 mg/dL   Nitrite, UA Positive (A) Negative   Microscopic Examination See below:   Microscopic Examination     Status: Abnormal   Collection Time: 06/10/21  8:52 AM   Urine  Result Value Ref Range   WBC, UA >30 (A) 0 - 5  /hpf   RBC 0-2 0 - 2 /hpf   Epithelial Cells (non renal) 0-10 0 - 10 /hpf   Bacteria, UA Many (A) None seen/Few  Bladder Scan (Post Void Residual) in office     Status: None   Collection Time: 06/10/21  9:03 AM  Result Value Ref Range   Scan Result 66   Urinalysis, Complete     Status: Abnormal   Collection Time: 08/10/21  1:53 PM  Result Value Ref Range   Specific Gravity, UA 1.010 1.005 - 1.030   pH, UA 7.0 5.0 - 7.5   Color, UA Yellow Yellow   Appearance Ur Clear Clear   Leukocytes,UA 1+ (A) Negative   Protein,UA Negative Negative/Trace   Glucose, UA Negative Negative   Ketones, UA Negative Negative   RBC, UA Trace (A) Negative   Bilirubin, UA Negative Negative   Urobilinogen, Ur 0.2 0.2 - 1.0 mg/dL   Nitrite, UA Negative Negative   Microscopic Examination See below:   Microscopic Examination     Status: Abnormal   Collection Time: 08/10/21  1:53 PM   Urine  Result Value Ref Range   WBC, UA 11-30 (A) 0 - 5 /hpf   RBC None seen 0 - 2 /hpf   Epithelial Cells (non renal) 0-10 0 - 10 /hpf   Bacteria, UA Moderate (A) None seen/Few  Bladder Scan (Post Void Residual) in office     Status: None   Collection Time: 08/10/21  2:51 PM  Result Value Ref Range   Scan Result 121   CULTURE, URINE COMPREHENSIVE     Status: Abnormal   Collection Time:  08/10/21  3:20 PM   Specimen: Urine   UR  Result Value Ref Range   Urine Culture, Comprehensive Final report (A)    Organism ID, Bacteria Escherichia coli (A)     Comment: Cefazolin <=4 ug/mL Cefazolin with an MIC <=16 predicts susceptibility to the oral agents cefaclor, cefdinir, cefpodoxime, cefprozil, cefuroxime, cephalexin, and loracarbef when used for therapy of uncomplicated urinary tract infections due to E. coli, Klebsiella pneumoniae, and Proteus mirabilis. Greater than 100,000 colony forming units per mL    ANTIMICROBIAL SUSCEPTIBILITY Comment     Comment:       ** S = Susceptible; I = Intermediate; R = Resistant **                     P = Positive; N = Negative             MICS are expressed in micrograms per mL    Antibiotic                 RSLT#1    RSLT#2    RSLT#3    RSLT#4 Amoxicillin/Clavulanic Acid    S Ampicillin                     S Cefepime                       S Ceftriaxone                    S Cefuroxime                     S Ciprofloxacin                  S Ertapenem                      S Gentamicin                     S Imipenem                       S Levofloxacin                   S Meropenem                      S Nitrofurantoin                 S Piperacillin/Tazobactam        S Tetracycline                   R Tobramycin                     S Trimethoprim/Sulfa             R     Assessment/Plan: Essential hypertension, benign blood pressure control important in reducing the progression of atherosclerotic disease and aneurysmal degeneration. On appropriate oral medications.    Iliac artery aneurysm [facility]) We last saw him for this about 4-1/2 years ago.  Was followed in [place] for some time.  We will need to see when this was last checked and this should be followed at least annually if not more frequently if it has enlarged since his last visit here.  Hyperlipidemia lipid control important in reducing the progression of atherosclerotic disease. Continue statin therapy  Lymphedema of both lower extremities His swelling is quite pronounced particularly in the left leg.  We will get a venous reflux study in the near future at his convenience.  A lymphedema pump would likely be an excellent adjuvant therapy to help improve his swelling long-term.  Lower limb ulcer, calf, left, limited to breakdown of skin (HCC) 3 layer Unna boot was placed today and will be changed weekly.  His arterial studies were normal so he has adequate arterial perfusion for wound healing.  We will get a venous reflux study in the near future to see if venous reflux is contributing to his swelling and high  pressures.  He has significant lymphedema that is not adequately controlled.  He needs to wear compression every day and elevate his legs is much as possible.  A lymphedema pump would be excellent adjuvant therapy as well.      Leotis Pain 08/30/2021, 11:22 AM   This note was created with Dragon medical transcription system.  Any errors from dictation are unintentional.

## 2021-08-30 NOTE — Assessment & Plan Note (Signed)
3 layer Unna boot was placed today and will be changed weekly.  His arterial studies were normal so he has adequate arterial perfusion for wound healing.  We will get a venous reflux study in the near future to see if venous reflux is contributing to his swelling and high pressures.  He has significant lymphedema that is not adequately controlled.  He needs to wear compression every day and elevate his legs is much as possible.  A lymphedema pump would be excellent adjuvant therapy as well.

## 2021-08-30 NOTE — Assessment & Plan Note (Signed)
His swelling is quite pronounced particularly in the left leg.  We will get a venous reflux study in the near future at his convenience.  A lymphedema pump would likely be an excellent adjuvant therapy to help improve his swelling long-term.

## 2021-08-30 NOTE — Assessment & Plan Note (Signed)
lipid control important in reducing the progression of atherosclerotic disease. Continue statin therapy  

## 2021-09-07 ENCOUNTER — Ambulatory Visit (INDEPENDENT_AMBULATORY_CARE_PROVIDER_SITE_OTHER): Payer: Medicare Other | Admitting: Nurse Practitioner

## 2021-09-07 VITALS — BP 145/81 | HR 79 | Resp 17 | Ht 73.0 in | Wt 260.0 lb

## 2021-09-07 DIAGNOSIS — L97221 Non-pressure chronic ulcer of left calf limited to breakdown of skin: Secondary | ICD-10-CM | POA: Diagnosis not present

## 2021-09-07 NOTE — Progress Notes (Signed)
History of Present Illness  There is no documented history at this time  Assessments & Plan   There are no diagnoses linked to this encounter.    Additional instructions  Subjective:  Patient presents with venous ulcer of the Left lower extremity.    Procedure:  3 layer unna wrap was placed Left lower extremity.   Plan:   Follow up in one week.  

## 2021-09-11 ENCOUNTER — Encounter (INDEPENDENT_AMBULATORY_CARE_PROVIDER_SITE_OTHER): Payer: Self-pay | Admitting: Nurse Practitioner

## 2021-09-14 ENCOUNTER — Telehealth: Payer: Self-pay | Admitting: Urology

## 2021-09-14 NOTE — Telephone Encounter (Signed)
Pt stopped by office to let us know his meds are not working.  He would like for you to give him a call.

## 2021-09-16 ENCOUNTER — Other Ambulatory Visit: Payer: Self-pay

## 2021-09-16 ENCOUNTER — Ambulatory Visit (INDEPENDENT_AMBULATORY_CARE_PROVIDER_SITE_OTHER): Payer: Medicare Other | Admitting: Nurse Practitioner

## 2021-09-16 ENCOUNTER — Encounter (INDEPENDENT_AMBULATORY_CARE_PROVIDER_SITE_OTHER): Payer: Self-pay

## 2021-09-16 ENCOUNTER — Ambulatory Visit (INDEPENDENT_AMBULATORY_CARE_PROVIDER_SITE_OTHER): Payer: Medicare Other

## 2021-09-16 VITALS — BP 125/78 | HR 84 | Resp 16

## 2021-09-16 DIAGNOSIS — L97221 Non-pressure chronic ulcer of left calf limited to breakdown of skin: Secondary | ICD-10-CM

## 2021-09-16 DIAGNOSIS — R600 Localized edema: Secondary | ICD-10-CM | POA: Diagnosis not present

## 2021-09-16 NOTE — Progress Notes (Signed)
History of Present Illness  There is no documented history at this time  Assessments & Plan   There are no diagnoses linked to this encounter.    Additional instructions  Subjective:  Patient presents with venous ulcer of the Left lower extremity.    Procedure:  3 layer unna wrap was placed Left lower extremity.   Plan:   Follow up in one week.  

## 2021-09-18 ENCOUNTER — Encounter (INDEPENDENT_AMBULATORY_CARE_PROVIDER_SITE_OTHER): Payer: Self-pay | Admitting: Nurse Practitioner

## 2021-09-23 ENCOUNTER — Ambulatory Visit (INDEPENDENT_AMBULATORY_CARE_PROVIDER_SITE_OTHER): Payer: Medicare Other | Admitting: Nurse Practitioner

## 2021-09-23 ENCOUNTER — Encounter (INDEPENDENT_AMBULATORY_CARE_PROVIDER_SITE_OTHER): Payer: Self-pay | Admitting: Nurse Practitioner

## 2021-09-23 ENCOUNTER — Other Ambulatory Visit: Payer: Self-pay

## 2021-09-23 VITALS — BP 106/69 | HR 84 | Resp 16

## 2021-09-23 DIAGNOSIS — L97221 Non-pressure chronic ulcer of left calf limited to breakdown of skin: Secondary | ICD-10-CM

## 2021-09-23 NOTE — Progress Notes (Signed)
History of Present Illness  There is no documented history at this time  Assessments & Plan   There are no diagnoses linked to this encounter.    Additional instructions  Subjective:  Patient presents with venous ulcer of the Left lower extremity.    Procedure:  3 layer unna wrap was placed Left lower extremity.   Plan:   Follow up in one week.  

## 2021-09-24 ENCOUNTER — Encounter (INDEPENDENT_AMBULATORY_CARE_PROVIDER_SITE_OTHER): Payer: Self-pay | Admitting: Nurse Practitioner

## 2021-09-27 ENCOUNTER — Ambulatory Visit: Payer: Medicare Other | Admitting: Urology

## 2021-09-30 ENCOUNTER — Telehealth: Payer: Self-pay | Admitting: *Deleted

## 2021-09-30 ENCOUNTER — Other Ambulatory Visit: Payer: Self-pay

## 2021-09-30 ENCOUNTER — Ambulatory Visit (INDEPENDENT_AMBULATORY_CARE_PROVIDER_SITE_OTHER): Payer: Medicare Other | Admitting: Nurse Practitioner

## 2021-09-30 ENCOUNTER — Encounter (INDEPENDENT_AMBULATORY_CARE_PROVIDER_SITE_OTHER): Payer: Self-pay | Admitting: Nurse Practitioner

## 2021-09-30 VITALS — BP 139/84 | HR 74 | Resp 16 | Wt 250.0 lb

## 2021-09-30 DIAGNOSIS — I890 Lymphedema, not elsewhere classified: Secondary | ICD-10-CM

## 2021-09-30 DIAGNOSIS — E785 Hyperlipidemia, unspecified: Secondary | ICD-10-CM

## 2021-09-30 DIAGNOSIS — L97221 Non-pressure chronic ulcer of left calf limited to breakdown of skin: Secondary | ICD-10-CM

## 2021-09-30 MED ORDER — SULFAMETHOXAZOLE-TRIMETHOPRIM 800-160 MG PO TABS: 1.0000 | ORAL_TABLET | Freq: Two times a day (BID) | ORAL | 0 refills | Status: DC

## 2021-09-30 NOTE — Telephone Encounter (Signed)
Pt calling requesting records be sent to [facility]. I advised  pt that he needs to fill out a record request form and we can [facility] to [facility].

## 2021-10-01 ENCOUNTER — Encounter (INDEPENDENT_AMBULATORY_CARE_PROVIDER_SITE_OTHER): Payer: Self-pay | Admitting: Nurse Practitioner

## 2021-10-01 NOTE — Progress Notes (Signed)
Subjective:    Patient ID: [name] Bean, male    DOB: [birth year], 87 y.o.   MRN: [id number] Chief Complaint  Patient presents with   Follow-up    4 week unna check    [name] Bean is a 87 year old male that presents today for follow-up evaluation after being in [place] [facility] for several weeks.  Today the wounds are mostly healed but there is still some remaining on the left lower extremity.  He also notes that the right lower extremity is swollen and tender.  There is evidence of cellulitis on the right lower extremity.  He denies any fevers or chills.  There is no open wounds or ulcerations on the right but there have been evidence of several blisters.   Review of Systems  Cardiovascular:  Positive for leg swelling.  Skin:  Positive for wound.  All other systems reviewed and are negative.     Objective:   Physical Exam Vitals reviewed.  HENT:     Head: Normocephalic.  Cardiovascular:     Rate and Rhythm: Normal rate.     Pulses:          Dorsalis pedis pulses are 1+ on the right side.       Posterior tibial pulses are 1+ on the right side.  Pulmonary:     Effort: Pulmonary effort is normal.  Skin:    General: Skin is warm and dry.  Neurological:     Mental Status: He is alert and oriented to person, place, and time.  Psychiatric:        Mood and Affect: Mood normal.        Behavior: Behavior normal.        Thought Content: Thought content normal.        Judgment: Judgment normal.    BP 139/84 (BP Location: Right Arm)    Pulse 74    Resp 16    Wt 250 lb (113.4 kg)    BMI 32.98 kg/m   Past Medical History:  Diagnosis Date   Arthritis    left knee;    Hyperlipidemia    Urethral stricture     Social History   Socioeconomic History   Marital status: Single    Spouse name: Not on file   Number of children: Not on file   Years of education: Not on file   Highest education level: Not on file  Occupational History   Not on file  Tobacco Use   Smoking status: Former     Types: Cigarettes    Quit date: 42    Years since quitting: 41.1   Smokeless tobacco: Never  Vaping Use   Vaping Use: Never used  Substance and Sexual Activity   Alcohol use: No   Drug use: No   Sexual activity: Yes  Other Topics Concern   Not on file  Social History Narrative   Not on file   Social Determinants of Health   Financial Resource Strain: Not on file  Food Insecurity: Not on file  Transportation Needs: Not on file  Physical Activity: Not on file  Stress: Not on file  Social Connections: Not on file  Intimate Partner Violence: Not on file    History reviewed. No pertinent surgical history.  Family History  Problem Relation Age of Onset   Diabetes Brother     Allergies  Allergen Reactions   Amoxicillin Swelling   Amoxicillin-Pot Clavulanate Hives, Shortness Of Breath and Swelling    Pt states he  had to go to the hospital because of this situation Pt states he had to go to the hospital because of this situation    Mecobalamin Nausea Only    CBC Latest Ref Rng & Units 07/16/2014  WBC 3.8 - 10.6 x10 3/mm 3 6.9  Hemoglobin 13.0 - 18.0 g/dL 12.0(L)  Hematocrit 40.0 - 52.0 % 37.0(L)  Platelets 150 - 440 x10 3/mm 3 184      CMP  No results found for: NA, K, CL, CO2, GLUCOSE, BUN, CREATININE, CALCIUM, PROT, ALBUMIN, AST, ALT, ALKPHOS, BILITOT, GFRNONAA, GFRAA   VAS [place] ABI WITH/WO TBI  Result Date: 08/31/2021  LOWER EXTREMITY DOPPLER STUDY Patient Name:  [name] Bean  Date of Exam:   08/30/2021 Medical Rec #: [id number]    Accession #:    [id number] Date of Birth: [birth year]    Patient Gender: M Patient Age:   76 years Exam Location:  [facility] Procedure:      VAS [place] ABI WITH/WO TBI Referring Phys: Leotis Pain --------------------------------------------------------------------------------  Indications: Lyphmedema Lt Leg.  Performing Technologist: Almira Coaster RVS  Examination Guidelines: A complete evaluation includes at minimum, Doppler  waveform signals and systolic blood pressure reading at the level of bilateral brachial, anterior tibial, and posterior tibial arteries, when vessel segments are accessible. Bilateral testing is considered an integral part of a complete examination. Photoelectric Plethysmograph (PPG) waveforms and toe systolic pressure readings are included as required and additional duplex testing as needed. Limited examinations for reoccurring indications may be performed as noted.  ABI Findings: +---------+------------------+-----+---------+--------+  Right     Rt Pressure (mmHg) Index Waveform  Comment   +---------+------------------+-----+---------+--------+  Brachial  118                                          +---------+------------------+-----+---------+--------+  ATA       145                      triphasic 1.23      +---------+------------------+-----+---------+--------+  PTA       137                1.16  triphasic           +---------+------------------+-----+---------+--------+  Great Toe 116                0.98  Normal              +---------+------------------+-----+---------+--------+ +---------+------------------+-----+---------+-------+  Left      Lt Pressure (mmHg) Index Waveform  Comment  +---------+------------------+-----+---------+-------+  Brachial  115                                         +---------+------------------+-----+---------+-------+  ATA       125                      triphasic 1.06     +---------+------------------+-----+---------+-------+  PTA       149                1.26  triphasic          +---------+------------------+-----+---------+-------+  Great Toe 129                1.09  Normal             +---------+------------------+-----+---------+-------+ +-------+-----------+-----------+------------+------------+  ABI/TBI Today's ABI Today's TBI Previous ABI Previous TBI  +-------+-----------+-----------+------------+------------+  Right   1.23        .98                                     +-------+-----------+-----------+------------+------------+  Left    1.26        1.09                                   +-------+-----------+-----------+------------+------------+  Summary: Right: Resting right ankle-brachial index is within normal range. No evidence of significant right lower extremity arterial disease. The right toe-brachial index is normal. Left: Resting left ankle-brachial index is within normal range. No evidence of significant left lower extremity arterial disease. The left toe-brachial index is normal.  *See table(s) above for measurements and observations.  Electronically signed by Leotis Pain MD on 08/31/2021 at 8:50:46 AM.    Final        Assessment & Plan:   1. Lower limb ulcer, calf, left, limited to breakdown of skin ([place]) No surgery or intervention at this point in time.    I have had a long discussion with the patient regarding venous insufficiency and why it  causes symptoms, specifically venous ulceration . I have discussed with the patient the chronic skin changes that accompany venous insufficiency and the long term sequela such as infection and recurring  ulceration.  Patient will be placed in [employer] which will be changed weekly drainage permitting.  In addition, behavioral modification including several periods of elevation of the lower extremities during the day will be continued. Achieving a position with the ankles at heart level was stressed to the patient  The patient is instructed to begin routine exercise, especially walking on a daily basis  Patient should undergo duplex ultrasound of the venous system to ensure that DVT or reflux is not present.  Following the review of the ultrasound the patient will follow up in four weeks to reassess the degree of swelling and the control that Unna therapy is offering.   The patient can be assessed for graduated compression stockings or wraps as well as a Lymph Pump once the ulcers are healed.   2.  Hyperlipidemia, unspecified hyperlipidemia type Continue statin as ordered and reviewed, no changes at this time   3. Lymphedema of both lower extremities No surgery or intervention at this point in time.    I have reviewed my discussion with the patient regarding venous insufficiency and secondary lymph edema and why it  causes symptoms. I have discussed with the patient the chronic skin changes that accompany these problems and the long term sequela such as ulceration and infection.  Patient will continue wearing graduated compression stockings class 1 (20-30 mmHg) on a daily basis a prescription was given to the patient to keep this updated. The patient will  put the stockings on first thing in the morning and removing them in the evening. The patient is instructed specifically not to sleep in the stockings.  In addition, behavioral modification including elevation during the day will be continued.  Diet and salt restriction was also discussed.   Current Outpatient Medications on File Prior to Visit  Medication Sig Dispense Refill   albuterol (PROVENTIL HFA;VENTOLIN HFA) 108 (90 Base) MCG/ACT inhaler Inhale into the lungs.  amitriptyline (ELAVIL) 10 MG tablet Take by mouth.     aspirin EC 81 MG tablet Take 81 mg by mouth.     augmented betamethasone dipropionate (DIPROLENE-AF) 0.05 % ointment APPLY TO AFFECTED AREAS TWICE DAILY UNTIL CLEAR  2   azelastine (ASTELIN) 0.1 % nasal spray SPRAY ONCE IN EACH NOSTRIL TWICE DAILY     bisacodyl (DULCOLAX) 5 MG EC tablet daily.     Cholecalciferol (VITAMIN D3) 1000 units CAPS Take by mouth.     diclofenac Sodium (VOLTAREN) 1 % GEL APPLY 4 GRAMS TO AFFECTED AREA 4 TIMES A DAY     docusate sodium (COLACE) 100 MG capsule Take by mouth.     fluticasone (FLONASE) 50 MCG/ACT nasal spray SPRAY 2 SPRAYS INTO EACH NOSTRIL EVERY DAY  2   gabapentin (NEURONTIN) 300 MG capsule Take 300 mg by mouth 2 (two) times daily.     meloxicam (MOBIC) 15 MG tablet Take 15  mg by mouth daily.     metolazone (ZAROXOLYN) 2.5 MG tablet Take 2.5 mg by mouth every other day.     mupirocin ointment (BACTROBAN) 2 % APPLY TO AFFECTED AREA 3 TIMES A DAY FOR 7 DAYS     omeprazole (PRILOSEC) 40 MG capsule Take 40 mg by mouth daily.     oxyCODONE-acetaminophen (PERCOCET) 10-325 MG tablet Take 1 tablet by mouth 4 (four) times daily as needed.     potassium chloride SA (KLOR-CON) 20 MEQ tablet TAKE 1 TABLET (20 MEQ TOTAL) BY MOUTH 3 (THREE) TIMES DAILY.     tamsulosin (FLOMAX) 0.4 MG CAPS capsule Take 0.4 mg by mouth daily.     Testosterone 10 MG/ACT (2%) GEL Apply 20 mg topically daily.     torsemide (DEMADEX) 20 MG tablet Take 1 tablet by mouth daily.     triamcinolone (KENALOG) 0.025 % cream triamcinolone acetonide 0.025 % topical cream     DULoxetine (CYMBALTA) 20 MG capsule Take 20 mg by mouth daily. (Patient not taking: Reported on 08/30/2021)     loratadine (CLARITIN) 10 MG tablet TAKE 1 TABLET (10 MG TOTAL) BY MOUTH ONCE DAILY. (NOT COVER) (Patient not taking: Reported on 09/30/2021)  11   Vibegron (GEMTESA) 75 MG TABS Take 75 mg by mouth daily. (Patient not taking: Reported on 08/30/2021) 28 tablet 0   No current facility-administered medications on file prior to visit.    There are no Patient Instructions on file for this visit. No follow-ups on file.   Kris Hartmann, NP

## 2021-10-07 ENCOUNTER — Encounter (INDEPENDENT_AMBULATORY_CARE_PROVIDER_SITE_OTHER): Payer: Self-pay

## 2021-10-07 ENCOUNTER — Ambulatory Visit (INDEPENDENT_AMBULATORY_CARE_PROVIDER_SITE_OTHER): Payer: Medicare Other | Admitting: Nurse Practitioner

## 2021-10-07 ENCOUNTER — Other Ambulatory Visit: Payer: Self-pay

## 2021-10-07 VITALS — BP 115/73 | HR 66 | Resp 15

## 2021-10-07 DIAGNOSIS — L97221 Non-pressure chronic ulcer of left calf limited to breakdown of skin: Secondary | ICD-10-CM | POA: Diagnosis not present

## 2021-10-07 NOTE — Progress Notes (Signed)
History of Present Illness  There is no documented history at this time  Assessments & Plan   There are no diagnoses linked to this encounter.    Additional instructions  Subjective:  Patient presents with venous ulcer of the Bilateral lower extremity.    Procedure:  3 layer unna wrap was placed Bilateral lower extremity.   Plan:   Follow up in one week.  

## 2021-10-14 ENCOUNTER — Encounter (INDEPENDENT_AMBULATORY_CARE_PROVIDER_SITE_OTHER): Payer: Self-pay

## 2021-10-14 ENCOUNTER — Ambulatory Visit (INDEPENDENT_AMBULATORY_CARE_PROVIDER_SITE_OTHER): Payer: Medicare Other | Admitting: Nurse Practitioner

## 2021-10-14 ENCOUNTER — Other Ambulatory Visit: Payer: Self-pay

## 2021-10-14 VITALS — BP 140/75 | HR 73 | Resp 16 | Wt 246.4 lb

## 2021-10-14 DIAGNOSIS — L97221 Non-pressure chronic ulcer of left calf limited to breakdown of skin: Secondary | ICD-10-CM | POA: Diagnosis not present

## 2021-10-14 NOTE — Progress Notes (Signed)
History of Present Illness  There is no documented history at this time  Assessments & Plan   There are no diagnoses linked to this encounter.    Additional instructions  Subjective:  Patient presents with venous ulcer of the Bilateral lower extremity.    Procedure:  3 layer unna wrap was placed Bilateral lower extremity.   Plan:   Follow up in one week.  

## 2021-10-15 ENCOUNTER — Encounter (INDEPENDENT_AMBULATORY_CARE_PROVIDER_SITE_OTHER): Payer: Self-pay | Admitting: Nurse Practitioner

## 2021-10-21 ENCOUNTER — Ambulatory Visit (INDEPENDENT_AMBULATORY_CARE_PROVIDER_SITE_OTHER): Payer: Medicare Other | Admitting: Nurse Practitioner

## 2021-10-21 ENCOUNTER — Other Ambulatory Visit: Payer: Self-pay

## 2021-10-21 VITALS — BP 164/76 | HR 71 | Resp 17 | Ht 73.0 in | Wt 253.0 lb

## 2021-10-21 DIAGNOSIS — L97221 Non-pressure chronic ulcer of left calf limited to breakdown of skin: Secondary | ICD-10-CM

## 2021-10-21 NOTE — Progress Notes (Signed)
History of Present Illness  There is no documented history at this time  Assessments & Plan   There are no diagnoses linked to this encounter.    Additional instructions  Subjective:  Patient presents with venous ulcer of the Bilateral lower extremity.    Procedure:  3 layer unna wrap was placed Bilateral lower extremity.   Plan:   Follow up in one week.  

## 2021-10-23 ENCOUNTER — Encounter (INDEPENDENT_AMBULATORY_CARE_PROVIDER_SITE_OTHER): Payer: Self-pay | Admitting: Nurse Practitioner

## 2021-10-25 ENCOUNTER — Encounter (INDEPENDENT_AMBULATORY_CARE_PROVIDER_SITE_OTHER): Payer: Self-pay

## 2021-10-25 ENCOUNTER — Ambulatory Visit (INDEPENDENT_AMBULATORY_CARE_PROVIDER_SITE_OTHER): Payer: Medicare Other | Admitting: Nurse Practitioner

## 2021-10-25 ENCOUNTER — Other Ambulatory Visit: Payer: Self-pay

## 2021-10-25 VITALS — BP 114/76 | HR 78 | Resp 16 | Wt 251.0 lb

## 2021-10-25 DIAGNOSIS — L97221 Non-pressure chronic ulcer of left calf limited to breakdown of skin: Secondary | ICD-10-CM

## 2021-10-25 NOTE — Progress Notes (Signed)
Patient was taking out of unna wraps today and will start wearing compressions stockings. Patient was advise to to wear compression socks in the morning and remove at night. Patient will reach out if he needs to return back into bilateral unna wraps. Patient will keep follow up as schedule.

## 2021-10-27 ENCOUNTER — Ambulatory Visit (INDEPENDENT_AMBULATORY_CARE_PROVIDER_SITE_OTHER): Payer: Medicare Other | Admitting: Nurse Practitioner

## 2021-10-27 ENCOUNTER — Other Ambulatory Visit: Payer: Self-pay

## 2021-10-27 ENCOUNTER — Encounter (INDEPENDENT_AMBULATORY_CARE_PROVIDER_SITE_OTHER): Payer: Medicare Other

## 2021-10-27 ENCOUNTER — Encounter (INDEPENDENT_AMBULATORY_CARE_PROVIDER_SITE_OTHER): Payer: Self-pay | Admitting: Nurse Practitioner

## 2021-10-27 VITALS — BP 112/72 | HR 71 | Resp 16

## 2021-10-27 DIAGNOSIS — L97221 Non-pressure chronic ulcer of left calf limited to breakdown of skin: Secondary | ICD-10-CM

## 2021-10-27 NOTE — Progress Notes (Signed)
History of Present Illness  There is no documented history at this time  Assessments & Plan   There are no diagnoses linked to this encounter.    Additional instructions  Subjective:  Patient presents with venous ulcer of the Bilateral lower extremity.    Procedure:  3 layer unna wrap was placed Bilateral lower extremity.   Plan:   Follow up in one week.  

## 2021-10-28 ENCOUNTER — Encounter (INDEPENDENT_AMBULATORY_CARE_PROVIDER_SITE_OTHER): Payer: Medicare Other | Admitting: Nurse Practitioner

## 2021-10-28 ENCOUNTER — Encounter (INDEPENDENT_AMBULATORY_CARE_PROVIDER_SITE_OTHER): Payer: Medicare Other

## 2021-10-29 ENCOUNTER — Encounter (INDEPENDENT_AMBULATORY_CARE_PROVIDER_SITE_OTHER): Payer: Self-pay | Admitting: Nurse Practitioner

## 2021-10-30 ENCOUNTER — Encounter (INDEPENDENT_AMBULATORY_CARE_PROVIDER_SITE_OTHER): Payer: Self-pay | Admitting: Nurse Practitioner

## 2021-11-04 ENCOUNTER — Encounter (INDEPENDENT_AMBULATORY_CARE_PROVIDER_SITE_OTHER): Payer: Self-pay | Admitting: Nurse Practitioner

## 2021-11-04 ENCOUNTER — Encounter (INDEPENDENT_AMBULATORY_CARE_PROVIDER_SITE_OTHER): Payer: Medicare Other

## 2021-11-04 ENCOUNTER — Other Ambulatory Visit: Payer: Self-pay

## 2021-11-04 ENCOUNTER — Ambulatory Visit (INDEPENDENT_AMBULATORY_CARE_PROVIDER_SITE_OTHER): Payer: Medicare Other | Admitting: Nurse Practitioner

## 2021-11-04 ENCOUNTER — Other Ambulatory Visit (INDEPENDENT_AMBULATORY_CARE_PROVIDER_SITE_OTHER): Payer: Self-pay | Admitting: Nurse Practitioner

## 2021-11-04 VITALS — BP 119/73 | HR 67 | Resp 16 | Wt 253.0 lb

## 2021-11-04 DIAGNOSIS — I10 Essential (primary) hypertension: Secondary | ICD-10-CM

## 2021-11-04 DIAGNOSIS — I890 Lymphedema, not elsewhere classified: Secondary | ICD-10-CM | POA: Diagnosis not present

## 2021-11-04 DIAGNOSIS — E785 Hyperlipidemia, unspecified: Secondary | ICD-10-CM

## 2021-11-05 ENCOUNTER — Encounter (INDEPENDENT_AMBULATORY_CARE_PROVIDER_SITE_OTHER): Payer: Self-pay | Admitting: Nurse Practitioner

## 2021-11-05 NOTE — Progress Notes (Signed)
? ?Subjective:  ? ? Patient ID: [name] Bean, male    DOB: 08-24-1935, 86 y.o.   MRN: [id number] ?Chief Complaint  ?Patient presents with  ? Follow-up  ?  Unna wrap follow up  ? ? ?[name] Bean is an 87 year old male that presents today for follow-up evaluation after being in bilateral arrives.  He was taken out of the wraps early about a week ago and subsequently developed a blister on his right leg.  The patient notes that he does have to urinate frequently and this causes him not to take his diuretics as often as prescribed.  He denies any fevers or chills. ? ? ?Review of Systems  ?Cardiovascular:  Positive for leg swelling.  ?Musculoskeletal:  Positive for gait problem.  ?All other systems reviewed and are negative. ? ?   ?Objective:  ? Physical Exam ?Vitals reviewed.  ?HENT:  ?   Head: Normocephalic.  ?Cardiovascular:  ?   Rate and Rhythm: Normal rate.  ?   Pulses: Normal pulses.  ?Pulmonary:  ?   Effort: Pulmonary effort is normal.  ?Skin: ?   General: Skin is warm and dry.  ?Neurological:  ?   Mental Status: He is alert and oriented to person, place, and time.  ?Psychiatric:     ?   Mood and Affect: Mood normal.     ?   Behavior: Behavior normal.     ?   Thought Content: Thought content normal.     ?   Judgment: Judgment normal.  ? ? ?BP 119/73 (BP Location: Left Arm)   Pulse 67   Resp 16   Wt 253 lb (114.8 kg)   BMI 33.38 kg/m?  ? ?Past Medical History:  ?Diagnosis Date  ? Arthritis   ? left knee;   ? Hyperlipidemia   ? Urethral stricture   ? ? ?Social History  ? ?Socioeconomic History  ? Marital status: Single  ?  Spouse name: Not on file  ? Number of children: Not on file  ? Years of education: Not on file  ? Highest education level: Not on file  ?Occupational History  ? Not on file  ?Tobacco Use  ? Smoking status: Former  ?  Types: Cigarettes  ?  Quit date: 76  ?  Years since quitting: 41.1  ? Smokeless tobacco: Never  ?Vaping Use  ? Vaping Use: Never used  ?Substance and Sexual Activity  ? Alcohol  use: No  ? Drug use: No  ? Sexual activity: Yes  ?Other Topics Concern  ? Not on file  ?Social History Narrative  ? Not on file  ? ?Social Determinants of Health  ? ?Financial Resource Strain: Not on file  ?Food Insecurity: Not on file  ?Transportation Needs: Not on file  ?Physical Activity: Not on file  ?Stress: Not on file  ?Social Connections: Not on file  ?Intimate Partner Violence: Not on file  ? ? ?History reviewed. No pertinent surgical history. ? ?Family History  ?Problem Relation Age of Onset  ? Diabetes Brother   ? ? ?Allergies  ?Allergen Reactions  ? Amoxicillin Swelling  ? Amoxicillin-Pot Clavulanate Hives, Shortness Of Breath and Swelling  ?  Pt states he had to go to the hospital because of this situation ?Pt states he had to go to the hospital because of this situation ?  ? Mecobalamin Nausea Only  ? ? ?CBC Latest Ref Rng & Units 07/16/2014  ?WBC 3.8 - 10.6 x10 3/mm 3 6.9  ?Hemoglobin 13.0 -  18.0 g/dL 12.0(L)  ?Hematocrit 40.0 - 52.0 % 37.0(L)  ?Platelets 150 - 440 x10 3/mm 3 184  ? ? ? ? ?CMP  ?No results found for: NA, K, CL, CO2, GLUCOSE, BUN, CREATININE, CALCIUM, PROT, ALBUMIN, AST, ALT, ALKPHOS, BILITOT, GFRNONAA, GFRAA ? ? ?No results found. ? ?   ?Assessment & Plan:  ? ?1. Lymphedema of both lower extremities ?The patient notes that he has not been taking his diuretics as prescribed.  He notes that he may take his torsemide once every other day.  Patient is advised to take his torsemide as prescribed as well as the Zaroxolyn as this will help with the swelling and likely help with the blisters that continue to form.  We will have the patient placed in Unna wraps to be wrapped on a weekly basis to..  With continued control of swelling. ? ?2. Essential hypertension, benign ?Continue antihypertensive medications as already ordered, these medications have been reviewed and there are no changes at this time.  ? ?3. Hyperlipidemia, unspecified hyperlipidemia type ?Continue statin as ordered and  reviewed, no changes at this time  ? ? ?Current Outpatient Medications on File Prior to Visit  ?Medication Sig Dispense Refill  ? albuterol (PROVENTIL HFA;VENTOLIN HFA) 108 (90 Base) MCG/ACT inhaler Inhale into the lungs.    ? amitriptyline (ELAVIL) 10 MG tablet Take by mouth.    ? aspirin EC 81 MG tablet Take 81 mg by mouth.    ? augmented betamethasone dipropionate (DIPROLENE-AF) 0.05 % ointment APPLY TO AFFECTED AREAS TWICE DAILY UNTIL CLEAR  2  ? azelastine (ASTELIN) 0.1 % nasal spray SPRAY ONCE IN EACH NOSTRIL TWICE DAILY    ? bisacodyl (DULCOLAX) 5 MG EC tablet daily.    ? Cholecalciferol (VITAMIN D3) 1000 units CAPS Take by mouth.    ? diclofenac Sodium (VOLTAREN) 1 % GEL APPLY 4 GRAMS TO AFFECTED AREA 4 TIMES A DAY    ? docusate sodium (COLACE) 100 MG capsule Take by mouth.    ? fluticasone (FLONASE) 50 MCG/ACT nasal spray SPRAY 2 SPRAYS INTO EACH NOSTRIL EVERY DAY  2  ? gabapentin (NEURONTIN) 300 MG capsule Take 300 mg by mouth 2 (two) times daily.    ? meloxicam (MOBIC) 15 MG tablet Take 15 mg by mouth daily.    ? metolazone (ZAROXOLYN) 2.5 MG tablet Take 2.5 mg by mouth every other day.    ? mupirocin ointment (BACTROBAN) 2 % APPLY TO AFFECTED AREA 3 TIMES A DAY FOR 7 DAYS    ? omeprazole (PRILOSEC) 40 MG capsule Take 40 mg by mouth daily.    ? oxyCODONE-acetaminophen (PERCOCET) 10-325 MG tablet Take 1 tablet by mouth 4 (four) times daily as needed.    ? potassium chloride SA (KLOR-CON) 20 MEQ tablet TAKE 1 TABLET (20 MEQ TOTAL) BY MOUTH 3 (THREE) TIMES DAILY.    ? sulfamethoxazole-trimethoprim (BACTRIM DS) 800-160 MG tablet Take 1 tablet by mouth 2 (two) times daily. 14 tablet 0  ? tamsulosin (FLOMAX) 0.4 MG CAPS capsule Take 0.4 mg by mouth daily.    ? Testosterone 10 MG/ACT (2%) GEL Apply 20 mg topically daily.    ? torsemide (DEMADEX) 20 MG tablet Take 1 tablet by mouth daily.    ? triamcinolone (KENALOG) 0.025 % cream triamcinolone acetonide 0.025 % topical cream    ? DULoxetine (CYMBALTA) 20 MG  capsule Take 20 mg by mouth daily. (Patient not taking: Reported on 08/30/2021)    ? loratadine (CLARITIN) 10 MG tablet TAKE 1  TABLET (10 MG TOTAL) BY MOUTH ONCE DAILY. (NOT COVER) (Patient not taking: Reported on 09/30/2021)  11  ? Vibegron (GEMTESA) 75 MG TABS Take 75 mg by mouth daily. (Patient not taking: Reported on 08/30/2021) 28 tablet 0  ? ?No current facility-administered medications on file prior to visit.  ? ? ?There are no Patient Instructions on file for this visit. ?No follow-ups on file. ? ? ?Kris Hartmann, NP ? ? ?

## 2021-11-07 ENCOUNTER — Other Ambulatory Visit: Payer: Self-pay | Admitting: Internal Medicine

## 2021-11-07 DIAGNOSIS — R1084 Generalized abdominal pain: Secondary | ICD-10-CM

## 2021-11-07 DIAGNOSIS — E785 Hyperlipidemia, unspecified: Secondary | ICD-10-CM

## 2021-11-08 LAB — AEROBIC CULTURE

## 2021-11-08 LAB — SPECIMEN STATUS REPORT

## 2021-11-09 ENCOUNTER — Ambulatory Visit: Payer: Medicare Other | Admitting: Urology

## 2021-11-11 ENCOUNTER — Other Ambulatory Visit: Payer: Self-pay

## 2021-11-11 ENCOUNTER — Ambulatory Visit (INDEPENDENT_AMBULATORY_CARE_PROVIDER_SITE_OTHER): Payer: Medicare Other | Admitting: Nurse Practitioner

## 2021-11-11 VITALS — BP 122/70 | HR 76 | Resp 17 | Ht 74.0 in | Wt 248.0 lb

## 2021-11-11 DIAGNOSIS — I890 Lymphedema, not elsewhere classified: Secondary | ICD-10-CM

## 2021-11-11 NOTE — Progress Notes (Signed)
History of Present Illness  There is no documented history at this time  Assessments & Plan   There are no diagnoses linked to this encounter.    Additional instructions  Subjective:  Patient presents with venous ulcer of the Bilateral lower extremity.    Procedure:  3 layer unna wrap was placed Bilateral lower extremity.   Plan:   Follow up in one week.  

## 2021-11-18 ENCOUNTER — Ambulatory Visit (INDEPENDENT_AMBULATORY_CARE_PROVIDER_SITE_OTHER): Payer: Medicare Other | Admitting: Nurse Practitioner

## 2021-11-18 ENCOUNTER — Encounter (INDEPENDENT_AMBULATORY_CARE_PROVIDER_SITE_OTHER): Payer: Medicare Other

## 2021-11-18 ENCOUNTER — Encounter (INDEPENDENT_AMBULATORY_CARE_PROVIDER_SITE_OTHER): Payer: Self-pay

## 2021-11-18 ENCOUNTER — Other Ambulatory Visit: Payer: Self-pay

## 2021-11-18 VITALS — BP 136/78 | HR 76 | Resp 16

## 2021-11-18 DIAGNOSIS — I890 Lymphedema, not elsewhere classified: Secondary | ICD-10-CM

## 2021-11-18 NOTE — Progress Notes (Signed)
History of Present Illness  There is no documented history at this time  Assessments & Plan   There are no diagnoses linked to this encounter.    Additional instructions  Subjective:  Patient presents with venous ulcer of the Bilateral lower extremity.    Procedure:  3 layer unna wrap was placed Bilateral lower extremity.   Plan:   Follow up in one week.  

## 2021-11-19 ENCOUNTER — Encounter (INDEPENDENT_AMBULATORY_CARE_PROVIDER_SITE_OTHER): Payer: Self-pay | Admitting: Nurse Practitioner

## 2021-11-20 ENCOUNTER — Encounter (INDEPENDENT_AMBULATORY_CARE_PROVIDER_SITE_OTHER): Payer: Self-pay | Admitting: Nurse Practitioner

## 2021-11-22 ENCOUNTER — Ambulatory Visit
Admission: RE | Admit: 2021-11-22 | Discharge: 2021-11-22 | Disposition: A | Discharge status: Home / Self Care | Payer: Medicare Other | Source: Ambulatory Visit | Attending: Internal Medicine | Admitting: Internal Medicine

## 2021-11-22 ENCOUNTER — Other Ambulatory Visit: Payer: Self-pay

## 2021-11-22 DIAGNOSIS — E785 Hyperlipidemia, unspecified: Secondary | ICD-10-CM | POA: Diagnosis present

## 2021-11-22 DIAGNOSIS — R1084 Generalized abdominal pain: Secondary | ICD-10-CM | POA: Diagnosis present

## 2021-11-22 MED ORDER — IOHEXOL 300 MG/ML  SOLN
100.0000 mL | Freq: Once | INTRAMUSCULAR | Status: AC | PRN
  Administered 2021-11-22: 100 mL via INTRAVENOUS

## 2021-11-25 ENCOUNTER — Encounter (INDEPENDENT_AMBULATORY_CARE_PROVIDER_SITE_OTHER): Payer: Self-pay

## 2021-11-25 ENCOUNTER — Encounter (INDEPENDENT_AMBULATORY_CARE_PROVIDER_SITE_OTHER): Payer: Medicare Other

## 2021-11-25 ENCOUNTER — Encounter (INDEPENDENT_AMBULATORY_CARE_PROVIDER_SITE_OTHER): Payer: Medicare Other | Admitting: Nurse Practitioner

## 2021-11-25 ENCOUNTER — Other Ambulatory Visit: Payer: Self-pay

## 2021-11-25 ENCOUNTER — Ambulatory Visit (INDEPENDENT_AMBULATORY_CARE_PROVIDER_SITE_OTHER): Payer: Medicare Other | Admitting: Nurse Practitioner

## 2021-11-25 VITALS — BP 145/82 | HR 79 | Resp 16

## 2021-11-25 DIAGNOSIS — L97221 Non-pressure chronic ulcer of left calf limited to breakdown of skin: Secondary | ICD-10-CM

## 2021-11-25 NOTE — Progress Notes (Signed)
History of Present Illness  There is no documented history at this time  Assessments & Plan   There are no diagnoses linked to this encounter.    Additional instructions  Subjective:  Patient presents with venous ulcer of the Bilateral lower extremity.    Procedure:  3 layer unna wrap was placed Bilateral lower extremity.   Plan:   Follow up in one week.  

## 2021-12-02 ENCOUNTER — Ambulatory Visit (INDEPENDENT_AMBULATORY_CARE_PROVIDER_SITE_OTHER): Payer: Medicare Other | Admitting: Nurse Practitioner

## 2021-12-02 ENCOUNTER — Ambulatory Visit (INDEPENDENT_AMBULATORY_CARE_PROVIDER_SITE_OTHER): Payer: Medicare Other | Admitting: Vascular Surgery

## 2021-12-02 ENCOUNTER — Ambulatory Visit (INDEPENDENT_AMBULATORY_CARE_PROVIDER_SITE_OTHER): Payer: Medicare Other

## 2021-12-02 ENCOUNTER — Encounter (INDEPENDENT_AMBULATORY_CARE_PROVIDER_SITE_OTHER): Payer: Medicare Other

## 2021-12-02 ENCOUNTER — Encounter (INDEPENDENT_AMBULATORY_CARE_PROVIDER_SITE_OTHER): Payer: Self-pay | Admitting: Nurse Practitioner

## 2021-12-02 VITALS — BP 126/70 | HR 76

## 2021-12-02 DIAGNOSIS — I890 Lymphedema, not elsewhere classified: Secondary | ICD-10-CM

## 2021-12-02 DIAGNOSIS — I10 Essential (primary) hypertension: Secondary | ICD-10-CM | POA: Diagnosis not present

## 2021-12-02 DIAGNOSIS — I723 Aneurysm of iliac artery: Secondary | ICD-10-CM

## 2021-12-02 DIAGNOSIS — I7143 Infrarenal abdominal aortic aneurysm, without rupture: Secondary | ICD-10-CM

## 2021-12-03 ENCOUNTER — Encounter (INDEPENDENT_AMBULATORY_CARE_PROVIDER_SITE_OTHER): Payer: Self-pay | Admitting: Nurse Practitioner

## 2021-12-03 NOTE — Progress Notes (Signed)
? ?Subjective:  ? ? Patient ID: [name] Bean, male    DOB: April 19, 1935, 87 y.o.   MRN: [id number] ?Chief Complaint  ?Patient presents with  ? Follow-up  ?  ultrasound  ? ? ?[name] Bean is an 87 year old male that returns today for abdominal aortic aneurysm evaluation as well as of his lymphedema.  The patient's blisters and ulcerations are much better.  The patient has had some difficulties with controlling his lymphedema and due to this he has not been able to proceed with the surgery.  He endorses being compliant with his Unna boots as well as elevating his legs.  He is also taking his medications as prescribed.  Patient also has a known abdominal aortic aneurysm.  Today it measures 3.8 cm.  The previous measurement was done in 2018 and it was 3.2 cm.  He also has some dilatation of the iliac arteries with the right being 1.8 cm and the left being 1.5 cm. ? ? ?Review of Systems  ?Cardiovascular:  Positive for leg swelling.  ?All other systems reviewed and are negative. ? ?   ?Objective:  ? Physical Exam ?Vitals reviewed.  ?HENT:  ?   Head: Normocephalic.  ?Cardiovascular:  ?   Rate and Rhythm: Normal rate.  ?Pulmonary:  ?   Effort: Pulmonary effort is normal.  ?Musculoskeletal:  ?   Right lower leg: 2+ Edema present.  ?   Left lower leg: 2+ Edema present.  ?Skin: ?   General: Skin is warm and dry.  ?   Comments: Dermal thickening with stasis dermatitis bilaterally  ?Neurological:  ?   Mental Status: He is alert and oriented to person, place, and time.  ?Psychiatric:     ?   Mood and Affect: Mood normal.     ?   Behavior: Behavior normal.     ?   Thought Content: Thought content normal.     ?   Judgment: Judgment normal.  ? ? ?BP 126/70 (BP Location: Right Arm)   Pulse 76  ? ?Past Medical History:  ?Diagnosis Date  ? Arthritis   ? left knee;   ? Hyperlipidemia   ? Urethral stricture   ? ? ?Social History  ? ?Socioeconomic History  ? Marital status: Single  ?  Spouse name: Not on file  ? Number of children: Not on  file  ? Years of education: Not on file  ? Highest education level: Not on file  ?Occupational History  ? Not on file  ?Tobacco Use  ? Smoking status: Former  ?  Types: Cigarettes  ?  Quit date: 46  ?  Years since quitting: 41.2  ? Smokeless tobacco: Never  ?Vaping Use  ? Vaping Use: Never used  ?Substance and Sexual Activity  ? Alcohol use: No  ? Drug use: No  ? Sexual activity: Yes  ?Other Topics Concern  ? Not on file  ?Social History Narrative  ? Not on file  ? ?Social Determinants of Health  ? ?Financial Resource Strain: Not on file  ?Food Insecurity: Not on file  ?Transportation Needs: Not on file  ?Physical Activity: Not on file  ?Stress: Not on file  ?Social Connections: Not on file  ?Intimate Partner Violence: Not on file  ? ? ?History reviewed. No pertinent surgical history. ? ?Family History  ?Problem Relation Age of Onset  ? Diabetes Brother   ? ? ?Allergies  ?Allergen Reactions  ? Amoxicillin Swelling  ? Amoxicillin-Pot Clavulanate Hives, Shortness Of Breath and Swelling  ?  Pt states he had to go to the hospital because of this situation ?Pt states he had to go to the hospital because of this situation ?  ? Mecobalamin Nausea Only  ? ? ? ?  Latest Ref Rng & Units 07/16/2014  ? 11:29 AM  ?CBC  ?WBC 3.8 - 10.6 x10 3/mm 3 6.9    ?Hemoglobin 13.0 - 18.0 g/dL 12.0    ?Hematocrit 40.0 - 52.0 % 37.0    ?Platelets 150 - 440 x10 3/mm 3 184    ? ? ? ? ?CMP  ?No results found for: NA, K, CL, CO2, GLUCOSE, BUN, CREATININE, CALCIUM, PROT, ALBUMIN, AST, ALT, ALKPHOS, BILITOT, GFRNONAA, GFRAA ? ? ?No results found. ? ?   ?Assessment & Plan:  ? ?1. Lymphedema of both lower extremities ?Recommend: ? ?No surgery or intervention at this point in time.   ? ?I have reviewed my previous discussion with the patient regarding swelling and why it causes symptoms.  Patient will continue wearing graduated compression stockings class 1 (20-30 mmHg) on a daily basis. The patient will  beginning wearing the stockings first thing  in the morning and removing them in the evening. The patient is instructed specifically not to sleep in the stockings.   ? ?In addition, behavioral modification including several periods of elevation of the lower extremities during the day will be continued.  This was reviewed with the patient during the initial visit. ? ?The patient will also continue routine exercise, especially walking on a daily basis as was discussed during the initial visit.   ? ?Despite conservative treatments including graduated compression therapy class 1 and behavioral modification including exercise and elevation the patient  has not obtained adequate control of the lymphedema.  The patient still has stage 3 lymphedema and therefore, I believe that a lymph pump should be added to improve the control of the patient's lymphedema.  Additionally, a lymph pump is warranted because it will reduce the risk of cellulitis and ulceration in the future. ? ?Patient will continue in [place] wraps to help maintain his swelling until we can obtain his pump, which will be instrumental in helping him to maintain his lymphedema. ? ?2. Infrarenal abdominal aortic aneurysm (AAA) without rupture ([place]) ?No surgery or intervention at this time. ?The patient has an asymptomatic abdominal aortic aneurysm that is less than 4 cm in maximal diameter.  I have discussed the natural history of abdominal aortic aneurysm and the small risk of rupture for aneurysm less than 5 cm in size.  However, as these small aneurysms tend to enlarge over time, continued surveillance with ultrasound or CT scan is mandatory.  ?I have also discussed optimizing medical management with hypertension and lipid control and the importance of abstinence from tobacco.  The patient is also encouraged to exercise a minimum of 30 minutes 4 times a week.  ?Should the patient develop new onset abdominal or back pain or signs of peripheral embolization they are instructed to seek medical attention  immediately and to alert the physician providing care that they have an aneurysm.  ?The patient voices their understanding. ?The patient will return in 12 months with an aortic duplex.  ? ?3. Iliac artery aneurysm ([place]) ?Currently the size is stable with the right side being largest at 1.8 cm.  We will continue to monitor with abdominal aortic aneurysm annually ? ?4. Essential hypertension, benign ?Continue antihypertensive medications as already ordered, these medications have been reviewed and there are no changes at this time.  ? ? ?  Current Outpatient Medications on File Prior to Visit  ?Medication Sig Dispense Refill  ? albuterol (PROVENTIL HFA;VENTOLIN HFA) 108 (90 Base) MCG/ACT inhaler Inhale into the lungs.    ? amitriptyline (ELAVIL) 10 MG tablet Take by mouth.    ? aspirin EC 81 MG tablet Take 81 mg by mouth.    ? augmented betamethasone dipropionate (DIPROLENE-AF) 0.05 % ointment APPLY TO AFFECTED AREAS TWICE DAILY UNTIL CLEAR  2  ? azelastine (ASTELIN) 0.1 % nasal spray SPRAY ONCE IN EACH NOSTRIL TWICE DAILY    ? bisacodyl (DULCOLAX) 5 MG EC tablet daily.    ? Cholecalciferol (VITAMIN D3) 1000 units CAPS Take by mouth.    ? diclofenac Sodium (VOLTAREN) 1 % GEL APPLY 4 GRAMS TO AFFECTED AREA 4 TIMES A DAY    ? docusate sodium (COLACE) 100 MG capsule Take by mouth.    ? DULoxetine (CYMBALTA) 20 MG capsule Take 20 mg by mouth daily.    ? fluticasone (FLONASE) 50 MCG/ACT nasal spray SPRAY 2 SPRAYS INTO EACH NOSTRIL EVERY DAY  2  ? gabapentin (NEURONTIN) 300 MG capsule Take 300 mg by mouth 2 (two) times daily.    ? loratadine (CLARITIN) 10 MG tablet   11  ? meloxicam (MOBIC) 15 MG tablet Take 15 mg by mouth daily.    ? metolazone (ZAROXOLYN) 2.5 MG tablet Take 2.5 mg by mouth every other day.    ? mupirocin ointment (BACTROBAN) 2 % APPLY TO AFFECTED AREA 3 TIMES A DAY FOR 7 DAYS    ? MYRBETRIQ 50 MG TB24 tablet Take 50 mg by mouth daily.    ? omeprazole (PRILOSEC) 40 MG capsule Take 40 mg by mouth daily.    ?  oxyCODONE-acetaminophen (PERCOCET) 10-325 MG tablet Take 1 tablet by mouth 4 (four) times daily as needed.    ? potassium chloride SA (KLOR-CON) 20 MEQ tablet TAKE 1 TABLET (20 MEQ TOTAL) BY MOUTH 3 (THREE) TI

## 2021-12-07 ENCOUNTER — Encounter (INDEPENDENT_AMBULATORY_CARE_PROVIDER_SITE_OTHER): Payer: Self-pay | Admitting: Nurse Practitioner

## 2021-12-07 ENCOUNTER — Ambulatory Visit (INDEPENDENT_AMBULATORY_CARE_PROVIDER_SITE_OTHER): Payer: Medicare Other | Admitting: Nurse Practitioner

## 2021-12-07 VITALS — BP 145/81 | HR 73 | Resp 16

## 2021-12-07 DIAGNOSIS — I890 Lymphedema, not elsewhere classified: Secondary | ICD-10-CM | POA: Diagnosis not present

## 2021-12-07 NOTE — Progress Notes (Signed)
History of Present Illness  There is no documented history at this time  Assessments & Plan   There are no diagnoses linked to this encounter.    Additional instructions  Subjective:  Patient presents with venous ulcer of the Bilateral lower extremity.    Procedure:  3 layer unna wrap was placed Bilateral lower extremity.   Plan:   Follow up in one week.  

## 2021-12-09 ENCOUNTER — Ambulatory Visit (INDEPENDENT_AMBULATORY_CARE_PROVIDER_SITE_OTHER): Payer: Medicare Other | Admitting: Vascular Surgery

## 2021-12-12 ENCOUNTER — Other Ambulatory Visit (INDEPENDENT_AMBULATORY_CARE_PROVIDER_SITE_OTHER): Payer: Self-pay | Admitting: Nurse Practitioner

## 2021-12-14 ENCOUNTER — Ambulatory Visit (INDEPENDENT_AMBULATORY_CARE_PROVIDER_SITE_OTHER): Payer: Medicare Other | Admitting: Nurse Practitioner

## 2021-12-14 ENCOUNTER — Encounter (INDEPENDENT_AMBULATORY_CARE_PROVIDER_SITE_OTHER): Payer: Self-pay | Admitting: Nurse Practitioner

## 2021-12-14 VITALS — BP 142/74 | HR 74 | Resp 16

## 2021-12-14 DIAGNOSIS — I890 Lymphedema, not elsewhere classified: Secondary | ICD-10-CM

## 2021-12-14 NOTE — Progress Notes (Signed)
History of Present Illness  There is no documented history at this time  Assessments & Plan   There are no diagnoses linked to this encounter.    Additional instructions  Subjective:  Patient presents with venous ulcer of the Bilateral lower extremity.    Procedure:  3 layer unna wrap was placed Bilateral lower extremity.   Plan:   Follow up in one week.  

## 2021-12-16 ENCOUNTER — Ambulatory Visit (INDEPENDENT_AMBULATORY_CARE_PROVIDER_SITE_OTHER): Payer: Medicare Other | Admitting: Vascular Surgery

## 2021-12-18 ENCOUNTER — Encounter (INDEPENDENT_AMBULATORY_CARE_PROVIDER_SITE_OTHER): Payer: Self-pay | Admitting: Nurse Practitioner

## 2021-12-19 ENCOUNTER — Encounter (INDEPENDENT_AMBULATORY_CARE_PROVIDER_SITE_OTHER): Payer: Self-pay | Admitting: Nurse Practitioner

## 2021-12-21 ENCOUNTER — Ambulatory Visit (INDEPENDENT_AMBULATORY_CARE_PROVIDER_SITE_OTHER): Payer: Medicare Other | Admitting: Nurse Practitioner

## 2021-12-21 ENCOUNTER — Encounter (INDEPENDENT_AMBULATORY_CARE_PROVIDER_SITE_OTHER): Payer: Self-pay | Admitting: Nurse Practitioner

## 2021-12-21 VITALS — BP 106/75 | HR 83 | Resp 16

## 2021-12-21 DIAGNOSIS — I890 Lymphedema, not elsewhere classified: Secondary | ICD-10-CM

## 2021-12-21 NOTE — Progress Notes (Signed)
History of Present Illness  There is no documented history at this time  Assessments & Plan   There are no diagnoses linked to this encounter.    Additional instructions  Subjective:  Patient presents with venous ulcer of the Bilateral lower extremity.    Procedure:  3 layer unna wrap was placed Bilateral lower extremity.   Plan:   Follow up in one week.  

## 2021-12-28 ENCOUNTER — Encounter (INDEPENDENT_AMBULATORY_CARE_PROVIDER_SITE_OTHER): Payer: Self-pay

## 2021-12-28 ENCOUNTER — Ambulatory Visit (INDEPENDENT_AMBULATORY_CARE_PROVIDER_SITE_OTHER): Payer: Medicare Other | Admitting: Nurse Practitioner

## 2021-12-28 VITALS — BP 117/75 | HR 76 | Resp 16

## 2021-12-28 DIAGNOSIS — L97221 Non-pressure chronic ulcer of left calf limited to breakdown of skin: Secondary | ICD-10-CM

## 2021-12-28 NOTE — Progress Notes (Signed)
History of Present Illness  There is no documented history at this time  Assessments & Plan   There are no diagnoses linked to this encounter.    Additional instructions  Subjective:  Patient presents with venous ulcer of the Bilateral lower extremity.    Procedure:  3 layer unna wrap was placed Bilateral lower extremity.   Plan:   Follow up in one week.  

## 2021-12-29 ENCOUNTER — Encounter (INDEPENDENT_AMBULATORY_CARE_PROVIDER_SITE_OTHER): Payer: Self-pay | Admitting: Nurse Practitioner

## 2022-01-01 ENCOUNTER — Encounter (INDEPENDENT_AMBULATORY_CARE_PROVIDER_SITE_OTHER): Payer: Self-pay | Admitting: Nurse Practitioner

## 2022-01-06 ENCOUNTER — Ambulatory Visit (INDEPENDENT_AMBULATORY_CARE_PROVIDER_SITE_OTHER): Payer: Medicare Other | Admitting: Vascular Surgery

## 2022-01-06 ENCOUNTER — Encounter (INDEPENDENT_AMBULATORY_CARE_PROVIDER_SITE_OTHER): Payer: Self-pay | Admitting: Vascular Surgery

## 2022-01-06 VITALS — BP 101/63 | HR 83 | Resp 17

## 2022-01-06 DIAGNOSIS — E785 Hyperlipidemia, unspecified: Secondary | ICD-10-CM

## 2022-01-06 DIAGNOSIS — I7143 Infrarenal abdominal aortic aneurysm, without rupture: Secondary | ICD-10-CM | POA: Diagnosis not present

## 2022-01-06 DIAGNOSIS — I890 Lymphedema, not elsewhere classified: Secondary | ICD-10-CM

## 2022-01-06 DIAGNOSIS — I10 Essential (primary) hypertension: Secondary | ICD-10-CM | POA: Diagnosis not present

## 2022-01-06 DIAGNOSIS — I723 Aneurysm of iliac artery: Secondary | ICD-10-CM

## 2022-01-06 NOTE — Assessment & Plan Note (Signed)
Swelling is under much better control with weekly Unna boots for the past several months.  He has a few small superficial scabs on the right leg and 1 on the left leg now but these are nearly healed.  I would think another few weeks and Unna boots will get these healed up and then he can proceed with his knee surgery once his skin is healed.  Compression, elevation, and activity are going to be extremely important in keeping his swelling under control once we come out of Unna boots.  I would also expect him to swell significantly after his knee surgery, and he may need wraps to help keep this from ulcerating postoperatively.  Return to clinic in 1 month to recheck his legs.  3 layer Unna boots were placed bilaterally today. Change these weekly ?

## 2022-01-06 NOTE — Progress Notes (Signed)
? ? ?MRN : [id number] ? ?[name] Bean is a 87 y.o. ([birth year]) male who presents with chief complaint of No chief complaint on file. ?. ? ?History of Present Illness: Patient returns today in follow up of his leg swelling and ulceration.  He has been getting weekly Unna boots now for a couple of months and his legs are looking much better.  He needs to get his wounds healed and swelling under better control to be able to proceed with knee surgery this summer.  Things are looking much better.  His pain and heaviness in the legs is better. ?He also has a AAA with iliac artery aneurysms.  He recently had a CT scan of the abdomen pelvis which I have reviewed which shows a 3.2 cm aortic aneurysm and 2.2 and 1.8 cm right and left common iliac artery aneurysms.  These are stable. ? ?Current Outpatient Medications  ?Medication Sig Dispense Refill  ? albuterol (PROVENTIL HFA;VENTOLIN HFA) 108 (90 Base) MCG/ACT inhaler Inhale into the lungs.    ? amitriptyline (ELAVIL) 10 MG tablet Take by mouth.    ? aspirin EC 81 MG tablet Take 81 mg by mouth.    ? augmented betamethasone dipropionate (DIPROLENE-AF) 0.05 % ointment APPLY TO AFFECTED AREAS TWICE DAILY UNTIL CLEAR  2  ? azelastine (ASTELIN) 0.1 % nasal spray SPRAY ONCE IN EACH NOSTRIL TWICE DAILY    ? bisacodyl (DULCOLAX) 5 MG EC tablet daily.    ? Cholecalciferol (VITAMIN D3) 1000 units CAPS Take by mouth.    ? diclofenac Sodium (VOLTAREN) 1 % GEL APPLY 4 GRAMS TO AFFECTED AREA 4 TIMES A DAY    ? docusate sodium (COLACE) 100 MG capsule Take by mouth.    ? DULoxetine (CYMBALTA) 20 MG capsule Take 20 mg by mouth daily.    ? fluticasone (FLONASE) 50 MCG/ACT nasal spray SPRAY 2 SPRAYS INTO EACH NOSTRIL EVERY DAY  2  ? loratadine (CLARITIN) 10 MG tablet   11  ? meloxicam (MOBIC) 15 MG tablet Take 15 mg by mouth daily.    ? metolazone (ZAROXOLYN) 2.5 MG tablet Take 2.5 mg by mouth every other day.    ? mupirocin ointment (BACTROBAN) 2 % APPLY TO AFFECTED AREA 3 TIMES A DAY FOR 7  DAYS    ? MYRBETRIQ 50 MG TB24 tablet Take 50 mg by mouth daily.    ? omeprazole (PRILOSEC) 40 MG capsule Take 40 mg by mouth daily.    ? oxyCODONE-acetaminophen (PERCOCET) 10-325 MG tablet Take 1 tablet by mouth 4 (four) times daily as needed.    ? potassium chloride SA (KLOR-CON) 20 MEQ tablet TAKE 1 TABLET (20 MEQ TOTAL) BY MOUTH 3 (THREE) TIMES DAILY.    ? predniSONE (DELTASONE) 20 MG tablet Take by mouth.    ? tamsulosin (FLOMAX) 0.4 MG CAPS capsule Take 0.4 mg by mouth daily.    ? Testosterone 10 MG/ACT (2%) GEL Apply 20 mg topically daily.    ? torsemide (DEMADEX) 20 MG tablet Take 1 tablet by mouth daily.    ? triamcinolone (KENALOG) 0.025 % cream triamcinolone acetonide 0.025 % topical cream    ? Vibegron (GEMTESA) 75 MG TABS Take 75 mg by mouth daily. 28 tablet 0  ? gabapentin (NEURONTIN) 300 MG capsule Take 300 mg by mouth 2 (two) times daily. (Patient not taking: Reported on 01/06/2022)    ? ?No current facility-administered medications for this visit.  ? ? ?Past Medical History:  ?Diagnosis Date  ? Arthritis   ?  left knee;   ? Hyperlipidemia   ? Urethral stricture   ? ? ?No past surgical history on file. ? ? ?Social History  ? ?Tobacco Use  ? Smoking status: Former  ?  Types: Cigarettes  ?  Quit date: 67  ?  Years since quitting: 41.3  ? Smokeless tobacco: Never  ?Vaping Use  ? Vaping Use: Never used  ?Substance Use Topics  ? Alcohol use: No  ? Drug use: No  ? ? ? ? ?Family History  ?Problem Relation Age of Onset  ? Diabetes Brother   ? ? ?Allergies  ?Allergen Reactions  ? Amoxicillin Swelling  ? Amoxicillin-Pot Clavulanate Hives, Shortness Of Breath and Swelling  ?  Pt states he had to go to the hospital because of this situation ?Pt states he had to go to the hospital because of this situation ?  ? Mecobalamin Nausea Only  ? ? ?REVIEW OF SYSTEMS (Negative unless checked) ?  ?Constitutional: '[]'$ Weight loss  '[]'$ Fever  '[]'$ Chills ?Cardiac: '[]'$ Chest pain   '[]'$ Chest pressure   '[]'$ Palpitations   '[]'$ Shortness of  breath when laying flat   '[]'$ Shortness of breath at rest   '[x]'$ Shortness of breath with exertion. ?Vascular:  '[]'$ Pain in legs with walking   '[]'$ Pain in legs at rest   '[]'$ Pain in legs when laying flat   '[]'$ Claudication   '[]'$ Pain in feet when walking  '[]'$ Pain in feet at rest  '[]'$ Pain in feet when laying flat   '[]'$ History of DVT   '[]'$ Phlebitis   '[x]'$ Swelling in legs   '[]'$ Varicose veins   '[x]'$ Non-healing ulcers ?Pulmonary:   '[]'$ Uses home oxygen   '[]'$ Productive cough   '[]'$ Hemoptysis   '[]'$ Wheeze  '[]'$ COPD   '[]'$ Asthma ?Neurologic:  '[]'$ Dizziness  '[]'$ Blackouts   '[]'$ Seizures   '[]'$ History of stroke   '[]'$ History of TIA  '[]'$ Aphasia   '[]'$ Temporary blindness   '[]'$ Dysphagia   '[]'$ Weakness or numbness in arms   '[]'$ Weakness or numbness in legs ?Musculoskeletal:  '[x]'$ Arthritis   '[]'$ Joint swelling   '[x]'$ Joint pain   '[]'$ Low back pain ?Hematologic:  '[]'$ Easy bruising  '[]'$ Easy bleeding   '[]'$ Hypercoagulable state   '[]'$ Anemic  '[]'$ Hepatitis ?Gastrointestinal:  '[]'$ Blood in stool   '[]'$ Vomiting blood  '[]'$ Gastroesophageal reflux/heartburn   '[]'$ Abdominal pain ?Genitourinary:  '[]'$ Chronic kidney disease   '[]'$ Difficult urination  '[]'$ Frequent urination  '[]'$ Burning with urination   '[]'$ Hematuria ?Skin:  '[]'$ Rashes   '[x]'$ Ulcers   '[x]'$ Wounds ?Psychological:  '[]'$ History of anxiety   '[]'$  History of major depression. ? ?Physical Examination ? ?BP 101/63 (BP Location: Right Arm)   Pulse 83   Resp 17  ?Gen:  WD/WN, NAD ?Head: [place]/AT, No temporalis wasting. ?Ear/Nose/Throat: Hearing grossly intact, nares w/o erythema or drainage ?Eyes: Conjunctiva clear. Sclera non-icteric ?Neck: Supple.  Trachea midline ?Pulmonary:  Good air movement, no use of accessory muscles.  ?Cardiac: Irregular ?Vascular:  ?Vessel Right Left  ?Radial Palpable Palpable  ?    ? ?Musculoskeletal: M/S 5/5 throughout.  No deformity or atrophy.  A few small superficial scabs on the right leg and 1 on the left leg.  1+ right lower extremity edema, 1-2+ left lower extremity edema. ?Neurologic: Sensation grossly intact in extremities.  Symmetrical.   Speech is fluent.  ?Psychiatric: Judgment intact, Mood & affect appropriate for pt's clinical situation. ?Dermatologic: Superficial scabs on the calves as described above ? ? ? ? ? ?Labs ?Recent Results (from the past 2160 hour(s))  ?Aerobic culture     Status: None  ? Collection Time: 11/04/21  2:10 AM  ?Result Value Ref  Range  ? Aerobic Bacterial Culture Final report   ? Organism ID, Bacteria Routine flora   ?  Comment: Light growth  ?Specimen status report     Status: None  ? Collection Time: 11/04/21  2:10 AM  ?Result Value Ref Range  ? specimen status report Comment   ?  Comment: Specimen Identification Status ?Specimen Identification Status ?Please be advised that a minor name discrepancy has occurred. The ?name on the sample(s) does not match the  name on the request form. ?All tests requested are in progress. ?NAME ON [place] [id number] ?  ? ? ?Radiology ?No results found. ? ?Assessment/Plan ?Essential hypertension, benign ?blood pressure control important in reducing the progression of atherosclerotic disease and aneurysmal degeneration. On appropriate oral medications. ?  ?  ?Iliac artery aneurysm ([place]) ?Recent CT scan of the abdomen pelvis which I reviewed demonstrating a 3.2 cm abdominal aortic aneurysm and 2.2 and 1.8 cm right and left common iliac artery aneurysms.  Stable.  Can be checked in 1 year. ?  ?Hyperlipidemia ?lipid control important in reducing the progression of atherosclerotic disease. Continue statin therapy ? ?Lymphedema of both lower extremities ?Swelling is under much better control with weekly Unna boots for the past several months.  He has a few small superficial scabs on the right leg and 1 on the left leg now but these are nearly healed.  I would think another few weeks and Unna boots will get these healed up and then he can proceed with his knee surgery once his skin is healed.  Compression, elevation, and activity are going to be extremely important in keeping his swelling under  control once we come out of Unna boots.  I would also expect him to swell significantly after his knee surgery, and he may need wraps to help keep this from ulcerating postoperatively.  Return to clinic in 1 mo

## 2022-01-11 ENCOUNTER — Ambulatory Visit (INDEPENDENT_AMBULATORY_CARE_PROVIDER_SITE_OTHER): Payer: Medicare Other | Admitting: Nurse Practitioner

## 2022-01-11 VITALS — BP 101/67 | HR 80 | Resp 16

## 2022-01-11 DIAGNOSIS — I890 Lymphedema, not elsewhere classified: Secondary | ICD-10-CM | POA: Diagnosis not present

## 2022-01-11 NOTE — Progress Notes (Signed)
History of Present Illness  There is no documented history at this time  Assessments & Plan   There are no diagnoses linked to this encounter.    Additional instructions  Subjective:  Patient presents with venous ulcer of the Bilateral lower extremity.    Procedure:  3 layer unna wrap was placed Bilateral lower extremity.   Plan:   Follow up in one week.  

## 2022-01-17 ENCOUNTER — Encounter (INDEPENDENT_AMBULATORY_CARE_PROVIDER_SITE_OTHER): Payer: Self-pay | Admitting: Nurse Practitioner

## 2022-01-18 ENCOUNTER — Ambulatory Visit (INDEPENDENT_AMBULATORY_CARE_PROVIDER_SITE_OTHER): Payer: Medicare Other | Admitting: Nurse Practitioner

## 2022-01-18 ENCOUNTER — Encounter (INDEPENDENT_AMBULATORY_CARE_PROVIDER_SITE_OTHER): Payer: Self-pay | Admitting: Nurse Practitioner

## 2022-01-18 VITALS — BP 118/70 | HR 76 | Resp 16

## 2022-01-18 DIAGNOSIS — L97221 Non-pressure chronic ulcer of left calf limited to breakdown of skin: Secondary | ICD-10-CM

## 2022-01-18 NOTE — Progress Notes (Signed)
History of Present Illness  There is no documented history at this time  Assessments & Plan   There are no diagnoses linked to this encounter.    Additional instructions  Subjective:  Patient presents with venous ulcer of the Bilateral lower extremity.    Procedure:  3 layer unna wrap was placed Bilateral lower extremity.   Plan:   Follow up in one week.  

## 2022-01-23 ENCOUNTER — Encounter (INDEPENDENT_AMBULATORY_CARE_PROVIDER_SITE_OTHER): Payer: Self-pay | Admitting: Nurse Practitioner

## 2022-01-25 ENCOUNTER — Encounter (INDEPENDENT_AMBULATORY_CARE_PROVIDER_SITE_OTHER): Payer: Self-pay | Admitting: Nurse Practitioner

## 2022-01-25 ENCOUNTER — Ambulatory Visit (INDEPENDENT_AMBULATORY_CARE_PROVIDER_SITE_OTHER): Payer: Medicare Other | Admitting: Nurse Practitioner

## 2022-01-25 VITALS — BP 130/76 | HR 72 | Resp 16

## 2022-01-25 DIAGNOSIS — L97221 Non-pressure chronic ulcer of left calf limited to breakdown of skin: Secondary | ICD-10-CM

## 2022-01-25 NOTE — Progress Notes (Signed)
History of Present Illness  There is no documented history at this time  Assessments & Plan   There are no diagnoses linked to this encounter.    Additional instructions  Subjective:  Patient presents with venous ulcer of the Bilateral lower extremity.    Procedure:  3 layer unna wrap was placed Bilateral lower extremity.   Plan:   Follow up in one week.  

## 2022-01-27 ENCOUNTER — Ambulatory Visit (INDEPENDENT_AMBULATORY_CARE_PROVIDER_SITE_OTHER): Payer: Medicare Other | Admitting: Nurse Practitioner

## 2022-01-27 ENCOUNTER — Encounter (INDEPENDENT_AMBULATORY_CARE_PROVIDER_SITE_OTHER): Payer: Self-pay

## 2022-01-27 VITALS — BP 129/76 | HR 81 | Resp 16

## 2022-01-27 DIAGNOSIS — L97221 Non-pressure chronic ulcer of left calf limited to breakdown of skin: Secondary | ICD-10-CM | POA: Diagnosis not present

## 2022-01-27 NOTE — Progress Notes (Unsigned)
History of Present Illness  There is no documented history at this time  Assessments & Plan   There are no diagnoses linked to this encounter.    Additional instructions  Subjective:  Patient presents with venous ulcer of the Bilateral lower extremity.    Procedure:  3 layer unna wrap was placed Bilateral lower extremity.   Plan:   Follow up in one week.  

## 2022-01-30 ENCOUNTER — Encounter (INDEPENDENT_AMBULATORY_CARE_PROVIDER_SITE_OTHER): Payer: Self-pay | Admitting: Nurse Practitioner

## 2022-02-01 ENCOUNTER — Ambulatory Visit (INDEPENDENT_AMBULATORY_CARE_PROVIDER_SITE_OTHER): Payer: Medicare Other | Admitting: Nurse Practitioner

## 2022-02-01 ENCOUNTER — Encounter (INDEPENDENT_AMBULATORY_CARE_PROVIDER_SITE_OTHER): Payer: Self-pay | Admitting: Nurse Practitioner

## 2022-02-01 VITALS — BP 119/78 | HR 80 | Resp 16

## 2022-02-01 DIAGNOSIS — I890 Lymphedema, not elsewhere classified: Secondary | ICD-10-CM | POA: Diagnosis not present

## 2022-02-01 NOTE — Progress Notes (Signed)
History of Present Illness  There is no documented history at this time  Assessments & Plan   There are no diagnoses linked to this encounter.    Additional instructions  Subjective:  Patient presents with venous ulcer of the Left lower extremity.    Procedure:  3 layer unna wrap was placed Left lower extremity.   Plan:   Follow up in one week.  

## 2022-02-06 ENCOUNTER — Encounter (INDEPENDENT_AMBULATORY_CARE_PROVIDER_SITE_OTHER): Payer: Self-pay | Admitting: Nurse Practitioner

## 2022-02-08 ENCOUNTER — Encounter (INDEPENDENT_AMBULATORY_CARE_PROVIDER_SITE_OTHER): Payer: Self-pay | Admitting: Nurse Practitioner

## 2022-02-08 ENCOUNTER — Ambulatory Visit (INDEPENDENT_AMBULATORY_CARE_PROVIDER_SITE_OTHER): Payer: Medicare Other | Admitting: Nurse Practitioner

## 2022-02-08 VITALS — BP 142/81 | HR 82 | Resp 16 | Wt 251.4 lb

## 2022-02-08 DIAGNOSIS — I10 Essential (primary) hypertension: Secondary | ICD-10-CM

## 2022-02-08 DIAGNOSIS — I890 Lymphedema, not elsewhere classified: Secondary | ICD-10-CM | POA: Diagnosis not present

## 2022-02-08 DIAGNOSIS — E785 Hyperlipidemia, unspecified: Secondary | ICD-10-CM | POA: Diagnosis not present

## 2022-02-14 ENCOUNTER — Telehealth (INDEPENDENT_AMBULATORY_CARE_PROVIDER_SITE_OTHER): Payer: Self-pay | Admitting: Nurse Practitioner

## 2022-02-14 NOTE — Telephone Encounter (Signed)
Patient called in stating his has scabs on his legs. And is wanting to know of any recommendations he can use for them     Please call and advise

## 2022-02-15 NOTE — Telephone Encounter (Signed)
He may not need to use anything if they are just scabs.  That's usually an indication the leg is attempting to heal.  However if it is an open blister or leaking fluid, he may need to go back in wraps, especially if he hadn't been using compression

## 2022-02-15 NOTE — Telephone Encounter (Signed)
Patient has been made aware with medical recommendations and verbalized understanding

## 2022-02-21 ENCOUNTER — Encounter (INDEPENDENT_AMBULATORY_CARE_PROVIDER_SITE_OTHER): Payer: Self-pay | Admitting: Nurse Practitioner

## 2022-02-21 NOTE — Progress Notes (Signed)
Subjective:    Patient ID: [name] Bean, male    DOB: 1935-02-09, 87 y.o.   MRN: [id number] Chief Complaint  Patient presents with   Follow-up    Unna wrap check    [name] Bean is an 87 year old male who presents today for follow-up evaluation of his lymphedema and lower extremity edema.  The swelling is much improved.  Currently there are no open wounds or ulcerations.  There is no current evidence of cellulitis.  The patient notes that he has been elevating his lower extremities in addition to taking his diuretics as prescribed.    Review of Systems  Cardiovascular:  Positive for leg swelling.  Musculoskeletal:  Positive for gait problem.  All other systems reviewed and are negative.      Objective:   Physical Exam Vitals reviewed.  HENT:     Head: Normocephalic.  Cardiovascular:     Rate and Rhythm: Normal rate.  Pulmonary:     Effort: Pulmonary effort is normal.  Musculoskeletal:     Right lower leg: Edema present.     Left lower leg: Edema present.  Skin:    General: Skin is warm and dry.  Neurological:     Mental Status: He is alert and oriented to person, place, and time.  Psychiatric:        Mood and Affect: Mood normal.        Behavior: Behavior normal.        Thought Content: Thought content normal.        Judgment: Judgment normal.     BP (!) 142/81 (BP Location: Left Arm)   Pulse 82   Resp 16   Wt 251 lb 6.4 oz (114 kg)   BMI 32.28 kg/m   Past Medical History:  Diagnosis Date   Arthritis    left knee;    Hyperlipidemia    Urethral stricture     Social History   Socioeconomic History   Marital status: Single    Spouse name: Not on file   Number of children: Not on file   Years of education: Not on file   Highest education level: Not on file  Occupational History   Not on file  Tobacco Use   Smoking status: Former    Types: Cigarettes    Quit date: 29    Years since quitting: 41.4   Smokeless tobacco: Never  Vaping Use   Vaping  Use: Never used  Substance and Sexual Activity   Alcohol use: No   Drug use: No   Sexual activity: Yes  Other Topics Concern   Not on file  Social History Narrative   Not on file   Social Determinants of Health   Financial Resource Strain: Not on file  Food Insecurity: Not on file  Transportation Needs: Not on file  Physical Activity: Not on file  Stress: Not on file  Social Connections: Not on file  Intimate Partner Violence: Not on file    History reviewed. No pertinent surgical history.  Family History  Problem Relation Age of Onset   Diabetes Brother     Allergies  Allergen Reactions   Amoxicillin Swelling   Amoxicillin-Pot Clavulanate Hives, Shortness Of Breath and Swelling    Pt states he had to go to the hospital because of this situation Pt states he had to go to the hospital because of this situation    Mecobalamin Nausea Only       Latest Ref Rng & Units 07/16/2014  11:29 AM  CBC  WBC 3.8 - 10.6 x10 3/mm 3 6.9   Hemoglobin 13.0 - 18.0 g/dL 12.0   Hematocrit 40.0 - 52.0 % 37.0   Platelets 150 - 440 x10 3/mm 3 184       CMP  No results found for: "NA", "K", "CL", "CO2", "GLUCOSE", "BUN", "CREATININE", "CALCIUM", "PROT", "ALBUMIN", "AST", "ALT", "ALKPHOS", "BILITOT", "GFRNONAA", "GFRAA"   No results found.     Assessment & Plan:   1. Lymphedema of both lower extremities We will remove the patient from [facility] today as the swelling is much improved.  Patient is advised to continue to take his Lasix and to continue with utilizing medical grade compression.  He is reminded elevating the legs.  Patient is advised to contact us sooner if he begins to have issues with weeping.  We will have patient return in 6 weeks for reevaluation.  Currently patient is likely acceptable for undergoing surgery.  2. Essential hypertension, benign Continue antihypertensive medications as already ordered, these medications have been reviewed and there are no changes  at this time.   3. Hyperlipidemia, unspecified hyperlipidemia type Continue statin as ordered and reviewed, no changes at this time    Current Outpatient Medications on File Prior to Visit  Medication Sig Dispense Refill   albuterol (PROVENTIL HFA;VENTOLIN HFA) 108 (90 Base) MCG/ACT inhaler Inhale into the lungs.     amitriptyline (ELAVIL) 10 MG tablet Take by mouth.     aspirin EC 81 MG tablet Take 81 mg by mouth.     augmented betamethasone dipropionate (DIPROLENE-AF) 0.05 % ointment APPLY TO AFFECTED AREAS TWICE DAILY UNTIL CLEAR  2   azelastine (ASTELIN) 0.1 % nasal spray SPRAY ONCE IN EACH NOSTRIL TWICE DAILY     bisacodyl (DULCOLAX) 5 MG EC tablet daily.     Cholecalciferol (VITAMIN D3) 1000 units CAPS Take by mouth.     diclofenac Sodium (VOLTAREN) 1 % GEL APPLY 4 GRAMS TO AFFECTED AREA 4 TIMES A DAY     docusate sodium (COLACE) 100 MG capsule Take by mouth.     DULoxetine (CYMBALTA) 20 MG capsule Take 20 mg by mouth daily.     fluticasone (FLONASE) 50 MCG/ACT nasal spray SPRAY 2 SPRAYS INTO EACH NOSTRIL EVERY DAY  2   loratadine (CLARITIN) 10 MG tablet   11   meloxicam (MOBIC) 15 MG tablet Take 15 mg by mouth daily.     metolazone (ZAROXOLYN) 2.5 MG tablet Take 2.5 mg by mouth every other day.     mupirocin ointment (BACTROBAN) 2 % APPLY TO AFFECTED AREA 3 TIMES A DAY FOR 7 DAYS     MYRBETRIQ 50 MG TB24 tablet Take 50 mg by mouth daily.     omeprazole (PRILOSEC) 40 MG capsule Take 40 mg by mouth daily.     oxyCODONE-acetaminophen (PERCOCET) 10-325 MG tablet Take 1 tablet by mouth 4 (four) times daily as needed.     potassium chloride SA (KLOR-CON) 20 MEQ tablet TAKE 1 TABLET (20 MEQ TOTAL) BY MOUTH 3 (THREE) TIMES DAILY.     predniSONE (DELTASONE) 20 MG tablet Take by mouth.     tamsulosin (FLOMAX) 0.4 MG CAPS capsule Take 0.4 mg by mouth daily.     Testosterone 10 MG/ACT (2%) GEL Apply 20 mg topically daily.     torsemide (DEMADEX) 20 MG tablet Take 1 tablet by mouth daily.      triamcinolone (KENALOG) 0.025 % cream triamcinolone acetonide 0.025 % topical cream  Vibegron (GEMTESA) 75 MG TABS Take 75 mg by mouth daily. 28 tablet 0   gabapentin (NEURONTIN) 300 MG capsule Take 300 mg by mouth 2 (two) times daily. (Patient not taking: Reported on 01/06/2022)     No current facility-administered medications on file prior to visit.    There are no Patient Instructions on file for this visit. No follow-ups on file.   Kris Hartmann, NP

## 2022-03-15 ENCOUNTER — Emergency Department
Admission: EM | Admit: 2022-03-15 | Discharge: 2022-03-15 | Discharge status: Against Medical Advice | Payer: Medicare Other

## 2022-03-15 NOTE — ED Notes (Signed)
No answer when called several times from lobby 

## 2022-03-16 ENCOUNTER — Telehealth (INDEPENDENT_AMBULATORY_CARE_PROVIDER_SITE_OTHER): Payer: Self-pay | Admitting: Vascular Surgery

## 2022-03-17 ENCOUNTER — Ambulatory Visit (INDEPENDENT_AMBULATORY_CARE_PROVIDER_SITE_OTHER): Payer: Medicare Other | Admitting: Vascular Surgery

## 2022-03-21 ENCOUNTER — Ambulatory Visit (INDEPENDENT_AMBULATORY_CARE_PROVIDER_SITE_OTHER): Payer: Medicare Other | Admitting: Vascular Surgery

## 2022-03-23 ENCOUNTER — Emergency Department: Payer: Medicare Other

## 2022-03-23 ENCOUNTER — Other Ambulatory Visit: Payer: Self-pay

## 2022-03-23 ENCOUNTER — Inpatient Hospital Stay
Admission: EM | Admit: 2022-03-23 | Discharge: 2022-04-05 | DRG: 473 | Disposition: A | Discharge status: Skilled Nursing Facility | Payer: Medicare Other | Attending: Internal Medicine | Admitting: Internal Medicine

## 2022-03-23 DIAGNOSIS — E669 Obesity, unspecified: Secondary | ICD-10-CM | POA: Diagnosis present

## 2022-03-23 DIAGNOSIS — M4807 Spinal stenosis, lumbosacral region: Secondary | ICD-10-CM | POA: Diagnosis present

## 2022-03-23 DIAGNOSIS — R29898 Other symptoms and signs involving the musculoskeletal system: Secondary | ICD-10-CM | POA: Diagnosis present

## 2022-03-23 DIAGNOSIS — M4802 Spinal stenosis, cervical region: Secondary | ICD-10-CM | POA: Diagnosis present

## 2022-03-23 DIAGNOSIS — Z7982 Long term (current) use of aspirin: Secondary | ICD-10-CM

## 2022-03-23 DIAGNOSIS — Z6829 Body mass index (BMI) 29.0-29.9, adult: Secondary | ICD-10-CM

## 2022-03-23 DIAGNOSIS — M5412 Radiculopathy, cervical region: Secondary | ICD-10-CM | POA: Diagnosis present

## 2022-03-23 DIAGNOSIS — Z880 Allergy status to penicillin: Secondary | ICD-10-CM

## 2022-03-23 DIAGNOSIS — Z791 Long term (current) use of non-steroidal anti-inflammatories (NSAID): Secondary | ICD-10-CM

## 2022-03-23 DIAGNOSIS — D638 Anemia in other chronic diseases classified elsewhere: Secondary | ICD-10-CM | POA: Diagnosis present

## 2022-03-23 DIAGNOSIS — Z91148 Patient's other noncompliance with medication regimen for other reason: Secondary | ICD-10-CM

## 2022-03-23 DIAGNOSIS — Z91199 Patient's noncompliance with other medical treatment and regimen due to unspecified reason: Secondary | ICD-10-CM

## 2022-03-23 DIAGNOSIS — I890 Lymphedema, not elsewhere classified: Secondary | ICD-10-CM

## 2022-03-23 DIAGNOSIS — M48061 Spinal stenosis, lumbar region without neurogenic claudication: Secondary | ICD-10-CM | POA: Diagnosis present

## 2022-03-23 DIAGNOSIS — M5001 Cervical disc disorder with myelopathy,  high cervical region: Principal | ICD-10-CM | POA: Diagnosis present

## 2022-03-23 DIAGNOSIS — M549 Dorsalgia, unspecified: Principal | ICD-10-CM

## 2022-03-23 DIAGNOSIS — M5136 Other intervertebral disc degeneration, lumbar region: Secondary | ICD-10-CM | POA: Diagnosis present

## 2022-03-23 DIAGNOSIS — I714 Abdominal aortic aneurysm, without rupture, unspecified: Secondary | ICD-10-CM | POA: Diagnosis present

## 2022-03-23 DIAGNOSIS — G959 Disease of spinal cord, unspecified: Secondary | ICD-10-CM | POA: Diagnosis present

## 2022-03-23 DIAGNOSIS — R9431 Abnormal electrocardiogram [ECG] [EKG]: Secondary | ICD-10-CM

## 2022-03-23 DIAGNOSIS — D649 Anemia, unspecified: Secondary | ICD-10-CM | POA: Diagnosis present

## 2022-03-23 DIAGNOSIS — K219 Gastro-esophageal reflux disease without esophagitis: Secondary | ICD-10-CM | POA: Diagnosis present

## 2022-03-23 DIAGNOSIS — G4733 Obstructive sleep apnea (adult) (pediatric): Secondary | ICD-10-CM | POA: Diagnosis present

## 2022-03-23 DIAGNOSIS — E785 Hyperlipidemia, unspecified: Secondary | ICD-10-CM | POA: Diagnosis present

## 2022-03-23 DIAGNOSIS — G4730 Sleep apnea, unspecified: Secondary | ICD-10-CM | POA: Diagnosis present

## 2022-03-23 DIAGNOSIS — Z87891 Personal history of nicotine dependence: Secondary | ICD-10-CM

## 2022-03-23 DIAGNOSIS — I10 Essential (primary) hypertension: Secondary | ICD-10-CM | POA: Diagnosis present

## 2022-03-23 DIAGNOSIS — R262 Difficulty in walking, not elsewhere classified: Secondary | ICD-10-CM | POA: Diagnosis present

## 2022-03-23 DIAGNOSIS — I723 Aneurysm of iliac artery: Secondary | ICD-10-CM | POA: Diagnosis present

## 2022-03-23 DIAGNOSIS — M6281 Muscle weakness (generalized): Secondary | ICD-10-CM

## 2022-03-23 DIAGNOSIS — J449 Chronic obstructive pulmonary disease, unspecified: Secondary | ICD-10-CM | POA: Diagnosis present

## 2022-03-23 DIAGNOSIS — D72829 Elevated white blood cell count, unspecified: Secondary | ICD-10-CM

## 2022-03-23 DIAGNOSIS — M1712 Unilateral primary osteoarthritis, left knee: Secondary | ICD-10-CM | POA: Diagnosis present

## 2022-03-23 DIAGNOSIS — Z888 Allergy status to other drugs, medicaments and biological substances status: Secondary | ICD-10-CM

## 2022-03-23 DIAGNOSIS — R202 Paresthesia of skin: Secondary | ICD-10-CM

## 2022-03-23 DIAGNOSIS — Z833 Family history of diabetes mellitus: Secondary | ICD-10-CM

## 2022-03-23 DIAGNOSIS — T501X6A Underdosing of loop [high-ceiling] diuretics, initial encounter: Secondary | ICD-10-CM | POA: Diagnosis present

## 2022-03-23 DIAGNOSIS — Z79899 Other long term (current) drug therapy: Secondary | ICD-10-CM

## 2022-03-23 LAB — URINALYSIS, ROUTINE W REFLEX MICROSCOPIC
Bilirubin Urine: NEGATIVE
Glucose, UA: NEGATIVE mg/dL
Hgb urine dipstick: NEGATIVE
Ketones, ur: NEGATIVE mg/dL
Leukocytes,Ua: NEGATIVE
Nitrite: NEGATIVE
Protein, ur: NEGATIVE mg/dL
Specific Gravity, Urine: 1.01 (ref 1.005–1.030)
pH: 8 (ref 5.0–8.0)

## 2022-03-23 LAB — CBC WITH DIFFERENTIAL/PLATELET
Abs Immature Granulocytes: 0.01 10*3/uL (ref 0.00–0.07)
Basophils Absolute: 0.1 10*3/uL (ref 0.0–0.1)
Basophils Relative: 1 %
Eosinophils Absolute: 0.4 10*3/uL (ref 0.0–0.5)
Eosinophils Relative: 5 %
HCT: 34.6 % — ABNORMAL LOW (ref 39.0–52.0)
Hemoglobin: 11.1 g/dL — ABNORMAL LOW (ref 13.0–17.0)
Immature Granulocytes: 0 %
Lymphocytes Relative: 40 %
Lymphs Abs: 2.8 10*3/uL (ref 0.7–4.0)
MCH: 28.4 pg (ref 26.0–34.0)
MCHC: 32.1 g/dL (ref 30.0–36.0)
MCV: 88.5 fL (ref 80.0–100.0)
Monocytes Absolute: 0.8 10*3/uL (ref 0.1–1.0)
Monocytes Relative: 12 %
Neutro Abs: 3 10*3/uL (ref 1.7–7.7)
Neutrophils Relative %: 42 %
Platelets: 204 10*3/uL (ref 150–400)
RBC: 3.91 MIL/uL — ABNORMAL LOW (ref 4.22–5.81)
RDW: 14.8 % (ref 11.5–15.5)
WBC: 7 10*3/uL (ref 4.0–10.5)
nRBC: 0 % (ref 0.0–0.2)

## 2022-03-23 LAB — COMPREHENSIVE METABOLIC PANEL
ALT: 25 U/L (ref 0–44)
AST: 29 U/L (ref 15–41)
Albumin: 4 g/dL (ref 3.5–5.0)
Alkaline Phosphatase: 98 U/L (ref 38–126)
Anion gap: 12 (ref 5–15)
BUN: 22 mg/dL (ref 8–23)
CO2: 28 mmol/L (ref 22–32)
Calcium: 9.6 mg/dL (ref 8.9–10.3)
Chloride: 100 mmol/L (ref 98–111)
Creatinine, Ser: 1.08 mg/dL (ref 0.61–1.24)
GFR, Estimated: >60 mL/min (ref >60)
Glucose, Bld: 100 mg/dL — ABNORMAL HIGH (ref 70–99)
Potassium: 3.9 mmol/L (ref 3.5–5.1)
Sodium: 140 mmol/L (ref 135–145)
Total Bilirubin: 0.6 mg/dL (ref 0.3–1.2)
Total Protein: 7.2 g/dL (ref 6.5–8.1)

## 2022-03-23 LAB — LIPASE, BLOOD: Lipase: 24 U/L (ref 11–51)

## 2022-03-23 LAB — CK: Total CK: 70 U/L (ref 49–397)

## 2022-03-23 LAB — TROPONIN I (HIGH SENSITIVITY): Troponin I (High Sensitivity): 9 ng/L (ref <18)

## 2022-03-23 MED ORDER — OXYCODONE-ACETAMINOPHEN 5-325 MG PO TABS
1.0000 | ORAL_TABLET | Freq: Once | ORAL | Status: AC
  Administered 2022-03-23: 1 via ORAL
  Filled 2022-03-23: qty 1

## 2022-03-23 MED ORDER — GADOBUTROL 1 MMOL/ML IV SOLN
10.0000 mL | Freq: Once | INTRAVENOUS | Status: AC | PRN
  Administered 2022-03-23: 10 mL via INTRAVENOUS

## 2022-03-23 NOTE — ED Provider Triage Note (Deleted)
Emergency Medicine Provider Triage Evaluation Note  Dickson Kostelnik , a 87 y.o. male  was evaluated in triage.  Pt complains of left flank/left abdominal pain x4 days.  Patient does have a history of kidney stones, feels similar to the last.  He has had some hematuria.  No dysuria.  No emesis, diarrhea, constipation.  Review of Systems  Positive: Patient with left flank/left abdominal pain, hematuria Negative: Nausea, vomiting, diarrhea, constipation  Physical Exam  Ht '6\' 2"'$  (1.88 m)   Wt 104.3 kg   BMI 29.53 kg/m  Gen:   Awake, no distress   Resp:  Normal effort  MSK:   Moves extremities without difficulty  Other:  Sounds present.  Tender along the left lower quadrant with left-sided CVA tenderness  Medical Decision Making  Medically screening exam initiated at 4:52 PM.  Appropriate orders placed.  Oval Cavazos was informed that the remainder of the evaluation will be completed by another provider, this initial triage assessment does not replace that evaluation, and the importance of remaining in the ED until their evaluation is complete.  Patient presents with left flank/left abdominal pain.  Has a history of kidney stones.  Will order labs, urinalysis, CT scan   Darletta Moll, PA-C 03/23/22 1652

## 2022-03-23 NOTE — ED Provider Notes (Addendum)
[facility] Provider Note    Event Date/Time   First MD Initiated Contact with Patient 03/23/22 2023     (approximate)   History   Neck Pain   HPI  [name] Bean is a 87 y.o. male with a past medical history of chronic arthritis in his back and left knee typically ambulatory with the assistance of a cane, HTN, HDL, chronic lymphedema, GERD, obesity, OSA, AAA, and diverticulitis who presents accompanied by son for evaluation of some acute on chronic fairly diffuse back pain associate with paresthesias in both arms as well as in his legs and worsening difficulty ambulating.  He states he is weak in both legs.  He did states the pain started the left shoulder and spread across to the right shoulder and then down his spine.  He denies any headache.  Symptoms all started about a week ago.  Since then he has had significant difficulty ambulating even with his cane.  No fevers, chest pain, abdominal pain, vomiting, diarrhea, rash and he is adamant he did not fall or have any injuries prior to the onset of this new symptoms.  States he is on aspirin but no other blood thinners.  He denies any IV drug use.  He denies any history of malignancy but does sometimes get steroids for his arthritis he reports.  Patient denies any fecal or urinary incontinence.  History reviewed. No pertinent surgical history.     Past Medical History:  Diagnosis Date   Arthritis    left knee;    Hyperlipidemia    Urethral stricture      Physical Exam  Triage Vital Signs: ED Triage Vitals  Enc Vitals Group     BP 03/23/22 1653 114/71     Pulse Rate 03/23/22 1653 84     Resp 03/23/22 1653 18     Temp 03/23/22 1653 98.2 F (36.8 C)     Temp Source 03/23/22 1653 Oral     SpO2 03/23/22 1653 100 %     Weight 03/23/22 1651 230 lb (104.3 kg)     Height 03/23/22 1651 '6\' 2"'$  (1.88 m)     Head Circumference --      Peak Flow --      Pain Score 03/23/22 1650 10     Pain Loc --      Pain Edu?  --      Excl. in [place]? --     Most recent vital signs: Vitals:   03/23/22 1653  BP: 114/71  Pulse: 84  Resp: 18  Temp: 98.2 F (36.8 C)  SpO2: 100%    General: Awake, no distress.  CV:  Good peripheral perfusion.  2+ radial pulses.  1+ PT pulses. Resp:  Normal effort.  Abd:  No distention.  Other:  Patient has symmetric strength in all extremities although this and globally weak in his lower extremities.  There is no clear focal tenderness as he seems fairly mildly tender throughout his spine from the thoracic to the lumbar spine.  Previous incision sites appear well-healed.  There is no overlying skin changes.  2+ bilateral patellar reflexes.   ED Results / Procedures / Treatments  Labs (all labs ordered are listed, but only abnormal results are displayed) Labs Reviewed  COMPREHENSIVE METABOLIC PANEL - Abnormal; Notable for the following components:      Result Value   Glucose, Bld 100 (*)    All other components within normal limits  CBC WITH DIFFERENTIAL/PLATELET - Abnormal;  Notable for the following components:   RBC 3.91 (*)    Hemoglobin 11.1 (*)    HCT 34.6 (*)    All other components within normal limits  URINALYSIS, ROUTINE W REFLEX MICROSCOPIC - Abnormal; Notable for the following components:   Color, Urine YELLOW (*)    APPearance CLEAR (*)    All other components within normal limits  LIPASE, BLOOD  CK  TROPONIN I (HIGH SENSITIVITY)     EKG  EKG is remarkable for sinus rhythm with a left bundle branch block and fairly diffuse nonspecific ST changes throughout with a ventricular of 76 and a QTc interval of 470 without other clearance of acute ischemia.   RADIOLOGY  MRI C/T and L-spine my interpretation without clear fracture but does show significant cervical stenosis.  I discussed with reading radiologist and agree with findings of severe spinal stenosis with compression of the spinal cord and signal change consistent with compressive myelopathy.  I also  agree with notation of moderate bilateral neural aminal foraminal stenosis at C5-C6 and mild right neural foraminal stenosis at L4-L5 and L5-S1 as well as notation of an aortic aneurysm.   PROCEDURES:  Critical Care performed: No  Procedures    MEDICATIONS ORDERED IN ED: Medications  oxyCODONE-acetaminophen (PERCOCET/ROXICET) 5-325 MG per tablet 1 tablet (1 tablet Oral Given 03/23/22 2102)  gadobutrol (GADAVIST) 1 MMOL/ML injection 10 mL (10 mLs Intravenous Contrast Given 03/23/22 2317)     IMPRESSION / MDM / ASSESSMENT AND PLAN / ED COURSE  I reviewed the triage vital signs and the nursing notes. Patient's presentation is most consistent with acute presentation with potential threat to life or bodily function.                               Differential diagnosis includes, but is not limited to possible cord compression from hematoma, disc, malignancy versus possible pathologic fracture.  His history exam at this time is not suggestive of preceding trauma or CVA or obvious infectious process.  EKG and troponin ordered in triage is not suggestive of ACS.  CBC shows no cytosis and hemoglobin 11.1.  Platelets are normal.  Lipase is WNL and not suggestive of pancreatitis.  CMP shows no significant electrolyte or metabolic derangements.  CK is unremarkable not suggestive of rhabdo or myositis.  MRI C/T and L-spine my interpretation without clear fracture but does show significant cervical stenosis.  I discussed with reading radiologist and agree with findings of severe spinal stenosis with compression of the spinal cord and signal change consistent with compressive myelopathy.  I also agree with notation of moderate bilateral neural aminal foraminal stenosis at C5-C6 and mild right neural foraminal stenosis at L4-L5 and L5-S1 as well as notation of an aortic aneurysm.  I discussed this MRI finding and patient's presentation with on-call neurosurgeon Dr. Joretta Bachelor recommended hospitalist  admission with plan to evaluate the patient tomorrow.  Patient will be admitted to hospitalist service for further evaluation and management.      FINAL CLINICAL IMPRESSION(S) / ED DIAGNOSES   Final diagnoses:  Acute bilateral back pain, unspecified back location  Paresthesia     Rx / DC Orders   ED Discharge Orders     None        Note:  This document was prepared using Dragon voice recognition software and may include unintentional dictation errors.   Lucrezia Starch, MD 03/23/22 Darci Needle    Hulan Saas  P, MD 03/23/22 2355

## 2022-03-23 NOTE — ED Triage Notes (Signed)
Pt states he has been having pain in his neck and all the way down into his feet x1 week- pt states that he also feels tingling in his arms and legs- pt states that his mobility has drastically decreased as well

## 2022-03-23 NOTE — ED Provider Triage Note (Signed)
Emergency Medicine Provider Triage Evaluation Note  [name] Bean , a 87 y.o. male  was evaluated in triage.  Pt complains of aching pain from his neck, through his arms, down his back, down both legs.  This been ongoing times a couple of days.  No headache, vision changes, numbness or tingling, unilateral weakness, chest pain, shortness of breath, abdominal pain, urinary symptoms.  Review of Systems  Positive: Musculoskeletal pain including the neck, back, arms and legs Negative: Chest pain, shortness of breath, cough, nasal congestion, sore throat, GI symptoms, urinary symptoms  Physical Exam  BP 114/71 (BP Location: Left Arm)   Pulse 84   Temp 98.2 F (36.8 C) (Oral)   Resp 18   Ht '6\' 2"'$  (1.88 m)   Wt 104.3 kg   SpO2 100%   BMI 29.53 kg/m  Gen:   Awake, no distress   Resp:  Normal effort  MSK:   Moves extremities without difficulty  Other:    Medical Decision Making  Medically screening exam initiated at 5:02 PM.  Appropriate orders placed.  [name] Bean was informed that the remainder of the evaluation will be completed by another provider, this initial triage assessment does not replace that evaluation, and the importance of remaining in the ED until their evaluation is complete.  Patient arrives with generalized aches and pains described as musculoskeletal pain starting in his neck going down both upper and lower extremities.  Patient with good pulses, sensation in all 4 extremities.  No recent trauma.  Patient denies any chest pain, shortness of breath, abdominal complaints.  Patient will have labs, chest x-ray, EKG at this time.   Darletta Moll, PA-C 03/23/22 1703

## 2022-03-24 ENCOUNTER — Inpatient Hospital Stay: Payer: Medicare Other

## 2022-03-24 ENCOUNTER — Encounter: Payer: Self-pay | Admitting: Internal Medicine

## 2022-03-24 ENCOUNTER — Other Ambulatory Visit: Payer: Self-pay

## 2022-03-24 ENCOUNTER — Ambulatory Visit (INDEPENDENT_AMBULATORY_CARE_PROVIDER_SITE_OTHER): Payer: Medicare Other | Admitting: Vascular Surgery

## 2022-03-24 DIAGNOSIS — M6281 Muscle weakness (generalized): Secondary | ICD-10-CM

## 2022-03-24 DIAGNOSIS — K219 Gastro-esophageal reflux disease without esophagitis: Secondary | ICD-10-CM | POA: Diagnosis present

## 2022-03-24 DIAGNOSIS — R202 Paresthesia of skin: Secondary | ICD-10-CM | POA: Diagnosis present

## 2022-03-24 DIAGNOSIS — T501X6A Underdosing of loop [high-ceiling] diuretics, initial encounter: Secondary | ICD-10-CM | POA: Diagnosis present

## 2022-03-24 DIAGNOSIS — Z888 Allergy status to other drugs, medicaments and biological substances status: Secondary | ICD-10-CM | POA: Diagnosis not present

## 2022-03-24 DIAGNOSIS — I890 Lymphedema, not elsewhere classified: Secondary | ICD-10-CM | POA: Diagnosis present

## 2022-03-24 DIAGNOSIS — R9431 Abnormal electrocardiogram [ECG] [EKG]: Secondary | ICD-10-CM

## 2022-03-24 DIAGNOSIS — M4802 Spinal stenosis, cervical region: Secondary | ICD-10-CM | POA: Diagnosis present

## 2022-03-24 DIAGNOSIS — G4733 Obstructive sleep apnea (adult) (pediatric): Secondary | ICD-10-CM | POA: Diagnosis present

## 2022-03-24 DIAGNOSIS — G992 Myelopathy in diseases classified elsewhere: Secondary | ICD-10-CM | POA: Insufficient documentation

## 2022-03-24 DIAGNOSIS — E785 Hyperlipidemia, unspecified: Secondary | ICD-10-CM | POA: Diagnosis present

## 2022-03-24 DIAGNOSIS — I714 Abdominal aortic aneurysm, without rupture, unspecified: Secondary | ICD-10-CM | POA: Diagnosis present

## 2022-03-24 DIAGNOSIS — Z91148 Patient's other noncompliance with medication regimen for other reason: Secondary | ICD-10-CM | POA: Diagnosis not present

## 2022-03-24 DIAGNOSIS — I10 Essential (primary) hypertension: Secondary | ICD-10-CM | POA: Diagnosis present

## 2022-03-24 DIAGNOSIS — J449 Chronic obstructive pulmonary disease, unspecified: Secondary | ICD-10-CM | POA: Diagnosis present

## 2022-03-24 DIAGNOSIS — M5412 Radiculopathy, cervical region: Secondary | ICD-10-CM | POA: Diagnosis present

## 2022-03-24 DIAGNOSIS — Z6829 Body mass index (BMI) 29.0-29.9, adult: Secondary | ICD-10-CM | POA: Diagnosis not present

## 2022-03-24 DIAGNOSIS — R262 Difficulty in walking, not elsewhere classified: Secondary | ICD-10-CM | POA: Diagnosis not present

## 2022-03-24 DIAGNOSIS — Z880 Allergy status to penicillin: Secondary | ICD-10-CM | POA: Diagnosis not present

## 2022-03-24 DIAGNOSIS — M5001 Cervical disc disorder with myelopathy,  high cervical region: Secondary | ICD-10-CM | POA: Diagnosis present

## 2022-03-24 DIAGNOSIS — Z833 Family history of diabetes mellitus: Secondary | ICD-10-CM | POA: Diagnosis not present

## 2022-03-24 DIAGNOSIS — M5136 Other intervertebral disc degeneration, lumbar region: Secondary | ICD-10-CM | POA: Diagnosis present

## 2022-03-24 DIAGNOSIS — M4807 Spinal stenosis, lumbosacral region: Secondary | ICD-10-CM | POA: Diagnosis present

## 2022-03-24 DIAGNOSIS — Z87891 Personal history of nicotine dependence: Secondary | ICD-10-CM | POA: Diagnosis not present

## 2022-03-24 DIAGNOSIS — G959 Disease of spinal cord, unspecified: Secondary | ICD-10-CM | POA: Diagnosis not present

## 2022-03-24 DIAGNOSIS — Z79899 Other long term (current) drug therapy: Secondary | ICD-10-CM | POA: Diagnosis not present

## 2022-03-24 DIAGNOSIS — E669 Obesity, unspecified: Secondary | ICD-10-CM | POA: Diagnosis present

## 2022-03-24 DIAGNOSIS — R29898 Other symptoms and signs involving the musculoskeletal system: Secondary | ICD-10-CM | POA: Diagnosis present

## 2022-03-24 DIAGNOSIS — M48061 Spinal stenosis, lumbar region without neurogenic claudication: Secondary | ICD-10-CM | POA: Diagnosis present

## 2022-03-24 DIAGNOSIS — M1712 Unilateral primary osteoarthritis, left knee: Secondary | ICD-10-CM | POA: Diagnosis present

## 2022-03-24 DIAGNOSIS — D638 Anemia in other chronic diseases classified elsewhere: Secondary | ICD-10-CM | POA: Diagnosis present

## 2022-03-24 DIAGNOSIS — I7143 Infrarenal abdominal aortic aneurysm, without rupture: Secondary | ICD-10-CM | POA: Diagnosis not present

## 2022-03-24 DIAGNOSIS — I723 Aneurysm of iliac artery: Secondary | ICD-10-CM | POA: Diagnosis present

## 2022-03-24 LAB — COMPREHENSIVE METABOLIC PANEL
ALT: 24 U/L (ref 0–44)
AST: 27 U/L (ref 15–41)
Albumin: 3.6 g/dL (ref 3.5–5.0)
Alkaline Phosphatase: 106 U/L (ref 38–126)
Anion gap: 4 — ABNORMAL LOW (ref 5–15)
BUN: 18 mg/dL (ref 8–23)
CO2: 28 mmol/L (ref 22–32)
Calcium: 9.7 mg/dL (ref 8.9–10.3)
Chloride: 108 mmol/L (ref 98–111)
Creatinine, Ser: 0.87 mg/dL (ref 0.61–1.24)
GFR, Estimated: >60 mL/min (ref >60)
Glucose, Bld: 108 mg/dL — ABNORMAL HIGH (ref 70–99)
Potassium: 4 mmol/L (ref 3.5–5.1)
Sodium: 140 mmol/L (ref 135–145)
Total Bilirubin: 0.8 mg/dL (ref 0.3–1.2)
Total Protein: 6.9 g/dL (ref 6.5–8.1)

## 2022-03-24 LAB — TYPE AND SCREEN
ABO/RH(D): A POS
Antibody Screen: NEGATIVE

## 2022-03-24 LAB — TSH: TSH: 1.307 u[IU]/mL (ref 0.350–4.500)

## 2022-03-24 LAB — HEMOGLOBIN A1C
Hgb A1c MFr Bld: 5.6 % (ref 4.8–5.6)
Mean Plasma Glucose: 114.02 mg/dL

## 2022-03-24 LAB — CBC
HCT: 35.6 % — ABNORMAL LOW (ref 39.0–52.0)
Hemoglobin: 11.4 g/dL — ABNORMAL LOW (ref 13.0–17.0)
MCH: 28.2 pg (ref 26.0–34.0)
MCHC: 32 g/dL (ref 30.0–36.0)
MCV: 88.1 fL (ref 80.0–100.0)
Platelets: 204 10*3/uL (ref 150–400)
RBC: 4.04 MIL/uL — ABNORMAL LOW (ref 4.22–5.81)
RDW: 14.7 % (ref 11.5–15.5)
WBC: 5.6 10*3/uL (ref 4.0–10.5)
nRBC: 0 % (ref 0.0–0.2)

## 2022-03-24 LAB — BRAIN NATRIURETIC PEPTIDE: B Natriuretic Peptide: 183.9 pg/mL — ABNORMAL HIGH (ref 0.0–100.0)

## 2022-03-24 MED ORDER — PANTOPRAZOLE SODIUM 40 MG PO TBEC
40.0000 mg | DELAYED_RELEASE_TABLET | Freq: Two times a day (BID) | ORAL | Status: DC
  Administered 2022-03-24 – 2022-04-05 (×25): 40 mg via ORAL
  Filled 2022-03-24 (×25): qty 1

## 2022-03-24 MED ORDER — DULOXETINE HCL 20 MG PO CPEP: 20.0000 mg | ORAL_CAPSULE | Freq: Every day | ORAL | Status: DC

## 2022-03-24 MED ORDER — DEXAMETHASONE SODIUM PHOSPHATE 4 MG/ML IJ SOLN
4.0000 mg | Freq: Two times a day (BID) | INTRAMUSCULAR | Status: AC
  Administered 2022-03-24 – 2022-03-25 (×4): 4 mg via INTRAVENOUS
  Filled 2022-03-24 (×4): qty 1

## 2022-03-24 MED ORDER — DEXAMETHASONE SODIUM PHOSPHATE 10 MG/ML IJ SOLN
10.0000 mg | Freq: Once | INTRAMUSCULAR | Status: AC
  Administered 2022-03-24: 10 mg via INTRAVENOUS
  Filled 2022-03-24: qty 1

## 2022-03-24 MED ORDER — HYDROCODONE-ACETAMINOPHEN 5-325 MG PO TABS
1.0000 | ORAL_TABLET | ORAL | Status: DC | PRN
  Administered 2022-03-24 – 2022-03-28 (×11): 1 via ORAL
  Filled 2022-03-24 (×11): qty 1

## 2022-03-24 MED ORDER — ACETAMINOPHEN 325 MG PO TABS
650.0000 mg | ORAL_TABLET | Freq: Four times a day (QID) | ORAL | Status: DC | PRN
Start: 2022-03-24 — End: 2022-04-05

## 2022-03-24 MED ORDER — HYDRALAZINE HCL 20 MG/ML IJ SOLN: 10.0000 mg | INTRAMUSCULAR | Status: DC | PRN

## 2022-03-24 MED ORDER — ALBUTEROL SULFATE (2.5 MG/3ML) 0.083% IN NEBU: 3.0000 mL | INHALATION_SOLUTION | Freq: Four times a day (QID) | RESPIRATORY_TRACT | Status: DC | PRN

## 2022-03-24 MED ORDER — ACETAMINOPHEN 650 MG RE SUPP: 650.0000 mg | Freq: Four times a day (QID) | RECTAL | Status: DC | PRN

## 2022-03-24 MED ORDER — MORPHINE SULFATE (PF) 2 MG/ML IV SOLN
2.0000 mg | INTRAVENOUS | Status: DC | PRN
  Administered 2022-03-26: 2 mg via INTRAVENOUS
  Filled 2022-03-24: qty 1

## 2022-03-24 MED ORDER — TAMSULOSIN HCL 0.4 MG PO CAPS
0.4000 mg | ORAL_CAPSULE | Freq: Every day | ORAL | Status: DC
  Administered 2022-03-24 – 2022-03-31 (×8): 0.4 mg via ORAL
  Filled 2022-03-24 (×8): qty 1

## 2022-03-24 MED ORDER — AMITRIPTYLINE HCL 10 MG PO TABS: 10.0000 mg | ORAL_TABLET | Freq: Every day | ORAL | Status: DC

## 2022-03-24 MED ORDER — SODIUM CHLORIDE 0.9% FLUSH
3.0000 mL | Freq: Two times a day (BID) | INTRAVENOUS | Status: DC
  Administered 2022-03-24 – 2022-04-05 (×26): 3 mL via INTRAVENOUS

## 2022-03-24 MED ORDER — FUROSEMIDE 20 MG PO TABS
20.0000 mg | ORAL_TABLET | Freq: Two times a day (BID) | ORAL | Status: DC
  Administered 2022-03-24 – 2022-03-25 (×4): 20 mg via ORAL
  Filled 2022-03-24 (×4): qty 1

## 2022-03-24 MED ORDER — SODIUM CHLORIDE 0.9 % IV SOLN: INTRAVENOUS | Status: DC

## 2022-03-24 MED ORDER — PANTOPRAZOLE SODIUM 40 MG IV SOLR
40.0000 mg | Freq: Two times a day (BID) | INTRAVENOUS | Status: DC
  Administered 2022-03-24: 40 mg via INTRAVENOUS
  Filled 2022-03-24: qty 10

## 2022-03-24 MED ORDER — ASPIRIN 81 MG PO TBEC: 81.0000 mg | DELAYED_RELEASE_TABLET | Freq: Every day | ORAL | Status: DC

## 2022-03-24 MED ORDER — TESTOSTERONE 10 MG/ACT (2%) TD GEL: 20.0000 mg | Freq: Every day | TRANSDERMAL | Status: DC

## 2022-03-24 NOTE — Assessment & Plan Note (Addendum)
Patient has a history of anemia and GI bleed.  avoid meloxicam and NSAIDs.

## 2022-03-24 NOTE — Assessment & Plan Note (Addendum)
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 Fall precautions. SNF at discharge

## 2022-03-24 NOTE — Progress Notes (Signed)
This is a no charge noticed patient was at needed this AM.  Patient seen and examined H&P reviewed.  [name] Bean is a 87 y.o. male seen in ed with complaints of neck pain. Pt has had previous cervical laminectomy.  Patient at baseline ambulates with a cane. Today patient reports paresthesias in both his arms as well as his legs and difficulty with ambulation and weakness.Pt has severe spinal stenosis on MRI done today and case was discussed with neurosurgery on-call, Dr. Cari Caraway  Patient reported has not been ambulatory much for the past couple of weeks due to these above issues.  Has not been taking his Lasix for the past week  Calm, NAD Cta no w/r Reg s1/s2 no gallop Soft benign +bs +pitting edema L>>R Aaoxox3  Mood and affect appropriate in current setting   A/P resume lasix Ck venous US r/o dvt  Plan for surgery on Monday Hold asa in preparation for above

## 2022-03-24 NOTE — H&P (Signed)
History and Physical    [name] Bean EVO:[id number] DOB: 12-08-1934 DOA: 03/23/2022  PCP: Baxter Hire, MD    Patient coming from:  Home    Chief Complaint:  Neck pain.     HPI:  [name] Bean is a 87 y.o. male seen in ed with complaints of neck pain. Pt has had previous cervical laminectomy.  Patient at baseline ambulates with a cane. Today patient reports paresthesias in both his arms as well as his legs and difficulty with ambulation and weakness. Patient reports pain that started in the left shoulder going to the right and also down his spine. Patient denies any headaches vision diplopia speech or gait issues otherwise any incontinence any bandlike pain. Patient also does not report any fevers or chills or falls or trauma chest pain shortness of breath abdominal pain fevers nausea vomiting diarrhea. Patient does report history of steroids for his arthritis. Pt has severe spinal stenosis on MRI done today and case was discussed with neurosurgery on-call, Dr. Cari Caraway Was agreed to see patient tomorrow as a consult and provide options. Pt has past medical history of allergies to amoxicillin, Augmentin, Mecobalamin.   ED Course:   Vitals:   03/23/22 1651 03/23/22 1653  BP:  114/71  Pulse:  84  Resp:  18  Temp:  98.2 F (36.8 C)  TempSrc:  Oral  SpO2:  100%  Weight: 104.3 kg   Height: '6\' 2"'$  (1.88 m)   In the emergency room patient is alert awake oriented afebrile oxygenating 100% on room air. CMP shows a glucose of 100 otherwise electrolytes and kidney function within normal limits as is the liver function test. CBC shows a normal white count of 7 hemoglobin of 11.1 normal platelets of 204. CPK of 70, troponin of 9. 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 3.5 cm abdominal aortic aneurysm with recommended follow-up every 2 years. In  the emergency room patient received Gadavist, Percocet.   Review of Systems:  Review of Systems  Musculoskeletal:  Positive for neck pain.  Neurological:  Positive for tingling and sensory change.  All other systems reviewed and are negative.  Past Medical History:  Diagnosis Date   Arthritis    left knee;    Hyperlipidemia    Urethral stricture     History reviewed. No pertinent surgical history.   reports that he quit smoking about 41 years ago. His smoking use included cigarettes. He has never used smokeless tobacco. He reports that he does not drink alcohol and does not use drugs.  Allergies  Allergen Reactions   Amoxicillin Swelling   Amoxicillin-Pot Clavulanate Hives, Shortness Of Breath and Swelling    Pt states he had to go to the hospital because of this situation Pt states he had to go to the hospital because of this situation    Mecobalamin Nausea Only    Family History  Problem Relation Age of Onset   Diabetes Brother     Prior to Admission medications   Medication Sig Start Date End Date Taking? Authorizing Provider  albuterol (PROVENTIL HFA;VENTOLIN HFA) 108 (90 Base) MCG/ACT inhaler Inhale into the lungs.    [provider]  amitriptyline (ELAVIL) 10 MG tablet Take by mouth. 09/08/21   [provider]  aspirin EC 81 MG tablet Take 81 mg by mouth. 09/16/12   [provider]  augmented betamethasone dipropionate (DIPROLENE-AF) 0.05 %  ointment APPLY TO AFFECTED AREAS TWICE DAILY UNTIL CLEAR 06/19/18   [provider]  azelastine (ASTELIN) 0.1 % nasal spray SPRAY ONCE IN EACH NOSTRIL TWICE DAILY 10/07/15   [provider]  bisacodyl (DULCOLAX) 5 MG EC tablet daily.    [provider]  Cholecalciferol (VITAMIN D3) 1000 units CAPS Take by mouth.    [provider]  diclofenac Sodium (VOLTAREN) 1 % GEL APPLY 4 GRAMS TO AFFECTED AREA 4 TIMES A DAY 08/17/20   [provider]  docusate sodium  (COLACE) 100 MG capsule Take by mouth.    [provider]  DULoxetine (CYMBALTA) 20 MG capsule Take 20 mg by mouth daily. 10/07/20   [provider]  fluticasone (FLONASE) 50 MCG/ACT nasal spray SPRAY 2 SPRAYS INTO EACH NOSTRIL EVERY DAY 06/14/18   [provider]  gabapentin (NEURONTIN) 300 MG capsule Take 300 mg by mouth 2 (two) times daily. Patient not taking: Reported on 01/06/2022 07/06/20   [provider]  loratadine (CLARITIN) 10 MG tablet  08/23/16   [provider]  meloxicam (MOBIC) 15 MG tablet Take 15 mg by mouth daily. 01/21/19   [provider]  metolazone (ZAROXOLYN) 2.5 MG tablet Take 2.5 mg by mouth every other day. 01/03/19   [provider]  mupirocin ointment (BACTROBAN) 2 % APPLY TO AFFECTED AREA 3 TIMES A DAY FOR 7 DAYS 04/08/19   [provider]  MYRBETRIQ 50 MG TB24 tablet Take 50 mg by mouth daily. 11/21/21   [provider]  omeprazole (PRILOSEC) 40 MG capsule Take 40 mg by mouth daily. 11/02/15   [provider]  oxyCODONE-acetaminophen (PERCOCET) 10-325 MG tablet Take 1 tablet by mouth 4 (four) times daily as needed. 04/06/19   [provider]  potassium chloride SA (KLOR-CON) 20 MEQ tablet TAKE 1 TABLET (20 MEQ TOTAL) BY MOUTH 3 (THREE) TIMES DAILY. 09/13/15   [provider]  predniSONE (DELTASONE) 20 MG tablet Take by mouth. 11/28/21   [provider]  tamsulosin (FLOMAX) 0.4 MG CAPS capsule Take 0.4 mg by mouth daily. 12/22/19   [provider]  Testosterone 10 MG/ACT (2%) GEL Apply 20 mg topically daily. 05/17/21   [provider]  torsemide (DEMADEX) 20 MG tablet Take 1 tablet by mouth daily. 05/30/21   [provider]  triamcinolone (KENALOG) 0.025 % cream triamcinolone acetonide 0.025 % topical cream 01/14/14   [provider]  Vibegron (GEMTESA) 75 MG TABS Take 75 mg by mouth daily. 08/10/21   Abbie Sons, MD    Physical  Exam: Vitals:   03/23/22 1651 03/23/22 1653  BP:  114/71  Pulse:  84  Resp:  18  Temp:  98.2 F (36.8 C)  TempSrc:  Oral  SpO2:  100%  Weight: 104.3 kg   Height: '6\' 2"'$  (1.88 m)    Physical Exam Vitals and nursing note reviewed.  Constitutional:      General: He is not in acute distress.    Appearance: Normal appearance. He is not ill-appearing, toxic-appearing or diaphoretic.  HENT:     Head: Normocephalic and atraumatic.     Right Ear: Hearing and external ear normal.     Left Ear: Hearing and external ear normal.     Nose: Nose normal. No nasal deformity.     Mouth/Throat:     Lips: Pink.     Mouth: Mucous membranes are moist.     Tongue: No lesions.     Pharynx: Oropharynx is  clear.  Eyes:     Extraocular Movements: Extraocular movements intact.     Pupils: Pupils are equal, round, and reactive to light.  Cardiovascular:     Rate and Rhythm: Normal rate and regular rhythm.     Pulses: Normal pulses.     Heart sounds: Normal heart sounds.  Pulmonary:     Effort: Pulmonary effort is normal.     Breath sounds: Normal breath sounds.  Abdominal:     General: Bowel sounds are normal. There is no distension.     Palpations: Abdomen is soft. There is no mass.     Tenderness: There is no abdominal tenderness. There is no guarding.     Hernia: A hernia is present.  Musculoskeletal:     Right lower leg: Edema present.     Left lower leg: Edema present.  Skin:    General: Skin is warm.  Neurological:     General: No focal deficit present.     Mental Status: He is alert and oriented to person, place, and time.     Cranial Nerves: Cranial nerves 2-12 are intact. No cranial nerve deficit.     Motor: Motor function is intact. No tremor or atrophy.     Deep Tendon Reflexes: Reflexes abnormal.     Reflex Scores:      Bicep reflexes are 1+ on the right side and 1+ on the left side. Psychiatric:        Attention and Perception: Attention normal.        Mood and Affect: Mood  normal.        Speech: Speech normal.        Behavior: Behavior normal. Behavior is cooperative.        Cognition and Memory: Cognition normal.      Labs on Admission: I have personally reviewed following labs and imaging studies BMET Recent Labs  Lab 03/23/22 1655  NA 140  K 3.9  CL 100  CO2 28  BUN 22  CREATININE 1.08  GLUCOSE 100*   Electrolytes Recent Labs  Lab 03/23/22 1655  CALCIUM 9.6   Sepsis Markers No results for input(s): "LATICACIDVEN", "PROCALCITON", "O2SATVEN" in the last 168 hours. ABG No results for input(s): "PHART", "PCO2ART", "PO2ART" in the last 168 hours. Liver Enzymes Recent Labs  Lab 03/23/22 1655  AST 29  ALT 25  ALKPHOS 98  BILITOT 0.6  ALBUMIN 4.0   Cardiac Enzymes No results for input(s): "TROPONINI", "PROBNP" in the last 168 hours. No results found for: "DDIMER" Coag's No results for input(s): "APTT", "INR" in the last 168 hours.  No results found for this or any previous visit (from the past 240 hour(s)).   Current Facility-Administered Medications:    0.9 %  sodium chloride infusion, , Intravenous, Continuous, Florina Ou V, MD   acetaminophen (TYLENOL) tablet 650 mg, 650 mg, Oral, Q6H PRN **OR** acetaminophen (TYLENOL) suppository 650 mg, 650 mg, Rectal, Q6H PRN, Posey Pronto, Loura Pitt V, MD   albuterol (VENTOLIN HFA) 108 (90 Base) MCG/ACT inhaler 2 puff, 2 puff, Inhalation, Q6H PRN, Para Skeans, MD   amitriptyline (ELAVIL) tablet 10 mg, 10 mg, Oral, QHS, Ladarrell Cornwall V, MD   aspirin EC tablet 81 mg, 81 mg, Oral, Daily, Courtnay Petrilla V, MD   dexamethasone (DECADRON) injection 10 mg, 10 mg, Intravenous, Once, Florina Ou V, MD   dexamethasone (DECADRON) injection 4 mg, 4 mg, Intravenous, Q12H, Dayana Dalporto V, MD   DULoxetine (CYMBALTA) DR capsule 20 mg, 20 mg, Oral,  Daily, Para Skeans, MD   hydrALAZINE (APRESOLINE) injection 10 mg, 10 mg, Intravenous, Q4H PRN, Para Skeans, MD   HYDROcodone-acetaminophen (NORCO/VICODIN) 5-325 MG per  tablet 1 tablet, 1 tablet, Oral, Q4H PRN, Para Skeans, MD   morphine (PF) 2 MG/ML injection 2 mg, 2 mg, Intravenous, Q4H PRN, Para Skeans, MD   pantoprazole (PROTONIX) injection 40 mg, 40 mg, Intravenous, Q12H, Cuca Benassi V, MD   sodium chloride flush (NS) 0.9 % injection 3 mL, 3 mL, Intravenous, Q12H, Dayna Alia V, MD   tamsulosin (FLOMAX) capsule 0.4 mg, 0.4 mg, Oral, Daily, Florina Ou V, MD   Testosterone 10 MG/ACT (2%) GEL 20 mg, 20 mg, Topical, Daily, Para Skeans, MD  Current Outpatient Medications:    albuterol (PROVENTIL HFA;VENTOLIN HFA) 108 (90 Base) MCG/ACT inhaler, Inhale into the lungs., Disp: , Rfl:    amitriptyline (ELAVIL) 10 MG tablet, Take by mouth., Disp: , Rfl:    aspirin EC 81 MG tablet, Take 81 mg by mouth., Disp: , Rfl:    augmented betamethasone dipropionate (DIPROLENE-AF) 0.05 % ointment, APPLY TO AFFECTED AREAS TWICE DAILY UNTIL CLEAR, Disp: , Rfl: 2   azelastine (ASTELIN) 0.1 % nasal spray, SPRAY ONCE IN EACH NOSTRIL TWICE DAILY, Disp: , Rfl:    bisacodyl (DULCOLAX) 5 MG EC tablet, daily., Disp: , Rfl:    Cholecalciferol (VITAMIN D3) 1000 units CAPS, Take by mouth., Disp: , Rfl:    diclofenac Sodium (VOLTAREN) 1 % GEL, APPLY 4 GRAMS TO AFFECTED AREA 4 TIMES A DAY, Disp: , Rfl:    docusate sodium (COLACE) 100 MG capsule, Take by mouth., Disp: , Rfl:    DULoxetine (CYMBALTA) 20 MG capsule, Take 20 mg by mouth daily., Disp: , Rfl:    fluticasone (FLONASE) 50 MCG/ACT nasal spray, SPRAY 2 SPRAYS INTO EACH NOSTRIL EVERY DAY, Disp: , Rfl: 2   gabapentin (NEURONTIN) 300 MG capsule, Take 300 mg by mouth 2 (two) times daily. (Patient not taking: Reported on 01/06/2022), Disp: , Rfl:    loratadine (CLARITIN) 10 MG tablet, , Disp: , Rfl: 11   meloxicam (MOBIC) 15 MG tablet, Take 15 mg by mouth daily., Disp: , Rfl:    metolazone (ZAROXOLYN) 2.5 MG tablet, Take 2.5 mg by mouth every other day., Disp: , Rfl:    mupirocin ointment (BACTROBAN) 2 %, APPLY TO AFFECTED AREA 3  TIMES A DAY FOR 7 DAYS, Disp: , Rfl:    MYRBETRIQ 50 MG TB24 tablet, Take 50 mg by mouth daily., Disp: , Rfl:    omeprazole (PRILOSEC) 40 MG capsule, Take 40 mg by mouth daily., Disp: , Rfl:    oxyCODONE-acetaminophen (PERCOCET) 10-325 MG tablet, Take 1 tablet by mouth 4 (four) times daily as needed., Disp: , Rfl:    potassium chloride SA (KLOR-CON) 20 MEQ tablet, TAKE 1 TABLET (20 MEQ TOTAL) BY MOUTH 3 (THREE) TIMES DAILY., Disp: , Rfl:    predniSONE (DELTASONE) 20 MG tablet, Take by mouth., Disp: , Rfl:    tamsulosin (FLOMAX) 0.4 MG CAPS capsule, Take 0.4 mg by mouth daily., Disp: , Rfl:    Testosterone 10 MG/ACT (2%) GEL, Apply 20 mg topically daily., Disp: , Rfl:    torsemide (DEMADEX) 20 MG tablet, Take 1 tablet by mouth daily., Disp: , Rfl:    triamcinolone (KENALOG) 0.025 % cream, triamcinolone acetonide 0.025 % topical cream, Disp: , Rfl:    Vibegron (GEMTESA) 75 MG TABS, Take 75 mg by mouth daily., Disp: 28 tablet, Rfl: 0  COVID-19 Labs No results for input(s): "DDIMER", "FERRITIN", "LDH", "CRP" in the last 72 hours. Lab Results  Component Value Date   [place] Not Detected 10/10/2019   SARSCOV2NAA Not Detected 08/15/2019   SARSCOV2NAA Not Detected 07/07/2019   [place] Not Detected 06/06/2019    Radiological Exams on Admission: MR Cervical Spine W or Wo Contrast  Addendum Date: 03/23/2022   ADDENDUM REPORT: 03/23/2022 23:54 ADDENDUM: Critical Value/emergent results were called by telephone at the time of interpretation on 03/23/2022 at 11:54 pm to provider [facility] , who verbally acknowledged these results. Electronically Signed   By: Ulyses Jarred M.D.   On: 03/23/2022 23:54   Result Date: 03/23/2022 CLINICAL DATA:  Neck and low back pain EXAM: MRI CERVICAL, THORACIC AND LUMBAR SPINE WITHOUT AND WITH CONTRAST TECHNIQUE: Multiplanar and multiecho pulse sequences of the cervical spine, to include the craniocervical junction and cervicothoracic junction, and thoracic and  lumbar spine, were obtained without and with intravenous contrast. CONTRAST:  38m GADAVIST GADOBUTROL 1 MMOL/ML IV SOLN COMPARISON:  None Available. FINDINGS: MRI CERVICAL SPINE FINDINGS Alignment: Physiologic. Vertebrae: C4-5 ACDF Cord: Hyperintense T2-weighted signal within the spinal cord at the C3-4 level. Posterior Fossa, vertebral arteries, paraspinal tissues: Negative. Disc levels: C1-2: Unremarkable. C2-3: Normal disc space and facet joints. There is no spinal canal stenosis. No neural foraminal stenosis. C3-4: Ligamentum flavum redundancy and small disc bulge. Severe spinal canal stenosis. Impingement of the spinal cord with signal change. Mild right and severe left neural foraminal stenosis. C4-5: Solid anterior arthrodesis. There is no spinal canal stenosis. No neural foraminal stenosis. C5-6: Small disc bulge with endplate spurring. There is no spinal canal stenosis. Moderate bilateral neural foraminal stenosis. C6-7: Disc space narrowing. There is no spinal canal stenosis. Mild left neural foraminal stenosis. C7-T1: Normal disc space and facet joints. There is no spinal canal stenosis. No neural foraminal stenosis. MRI THORACIC SPINE FINDINGS Alignment:  Physiologic. Vertebrae: No fracture, evidence of discitis, or bone lesion. Cord:  Normal signal and morphology. Paraspinal and other soft tissues: Negative. Disc levels: No spinal canal or neural foraminal stenosis. MRI LUMBAR SPINE FINDINGS Segmentation:  Standard. Alignment:  Physiologic. Vertebrae:  No fracture, evidence of discitis, or bone lesion. Conus medullaris and cauda equina: Conus extends to the L2 level. Conus and cauda equina appear normal. Paraspinal and other soft tissues: 3.5 cm abdominal aortic aneurysm. Disc levels: L1-L2: Mild disc bulge with endplate spurring. No spinal canal stenosis. No neural foraminal stenosis. L2-L3: Small disc bulge with endplate spurring. No spinal canal stenosis. Mild left neural foraminal stenosis. L3-L4:  Small disc bulge with endplate spurring. No spinal canal stenosis. No neural foraminal stenosis. L4-L5: Small left asymmetric disc bulge and mild facet hypertrophy. Left lateral recess narrowing without central spinal canal stenosis. Mild right neural foraminal stenosis. L5-S1: Disc space narrowing with small bulge. No spinal canal stenosis. Mild left neural foraminal stenosis. Visualized sacrum: Normal. IMPRESSION: 1. Severe C3-4 spinal canal stenosis with compression of the spinal cord and signal change consistent with compressive myelopathy. 2. Moderate bilateral neural foraminal stenosis at C5-6. 3. No thoracic or lumbar spinal canal stenosis. 4. Mild right neural foraminal stenosis at L4-5 and L5-S1. 5. 3.5 cm abdominal aortic aneurysm. Recommend follow-up every 2 years. Reference: J Am Coll Radiol [id number] Electronically Signed: By: KUlyses JarredM.D. On: 03/23/2022 23:42   MR THORACIC SPINE W WO CONTRAST  Addendum Date: 03/23/2022   ADDENDUM REPORT: 03/23/2022 23:54 ADDENDUM: Critical Value/emergent results were called by telephone at the time of  interpretation on 03/23/2022 at 11:54 pm to provider [facility] , who verbally acknowledged these results. Electronically Signed   By: Ulyses Jarred M.D.   On: 03/23/2022 23:54   Result Date: 03/23/2022 CLINICAL DATA:  Neck and low back pain EXAM: MRI CERVICAL, THORACIC AND LUMBAR SPINE WITHOUT AND WITH CONTRAST TECHNIQUE: Multiplanar and multiecho pulse sequences of the cervical spine, to include the craniocervical junction and cervicothoracic junction, and thoracic and lumbar spine, were obtained without and with intravenous contrast. CONTRAST:  32m GADAVIST GADOBUTROL 1 MMOL/ML IV SOLN COMPARISON:  None Available. FINDINGS: MRI CERVICAL SPINE FINDINGS Alignment: Physiologic. Vertebrae: C4-5 ACDF Cord: Hyperintense T2-weighted signal within the spinal cord at the C3-4 level. Posterior Fossa, vertebral arteries, paraspinal tissues: Negative. Disc  levels: C1-2: Unremarkable. C2-3: Normal disc space and facet joints. There is no spinal canal stenosis. No neural foraminal stenosis. C3-4: Ligamentum flavum redundancy and small disc bulge. Severe spinal canal stenosis. Impingement of the spinal cord with signal change. Mild right and severe left neural foraminal stenosis. C4-5: Solid anterior arthrodesis. There is no spinal canal stenosis. No neural foraminal stenosis. C5-6: Small disc bulge with endplate spurring. There is no spinal canal stenosis. Moderate bilateral neural foraminal stenosis. C6-7: Disc space narrowing. There is no spinal canal stenosis. Mild left neural foraminal stenosis. C7-T1: Normal disc space and facet joints. There is no spinal canal stenosis. No neural foraminal stenosis. MRI THORACIC SPINE FINDINGS Alignment:  Physiologic. Vertebrae: No fracture, evidence of discitis, or bone lesion. Cord:  Normal signal and morphology. Paraspinal and other soft tissues: Negative. Disc levels: No spinal canal or neural foraminal stenosis. MRI LUMBAR SPINE FINDINGS Segmentation:  Standard. Alignment:  Physiologic. Vertebrae:  No fracture, evidence of discitis, or bone lesion. Conus medullaris and cauda equina: Conus extends to the L2 level. Conus and cauda equina appear normal. Paraspinal and other soft tissues: 3.5 cm abdominal aortic aneurysm. Disc levels: L1-L2: Mild disc bulge with endplate spurring. No spinal canal stenosis. No neural foraminal stenosis. L2-L3: Small disc bulge with endplate spurring. No spinal canal stenosis. Mild left neural foraminal stenosis. L3-L4: Small disc bulge with endplate spurring. No spinal canal stenosis. No neural foraminal stenosis. L4-L5: Small left asymmetric disc bulge and mild facet hypertrophy. Left lateral recess narrowing without central spinal canal stenosis. Mild right neural foraminal stenosis. L5-S1: Disc space narrowing with small bulge. No spinal canal stenosis. Mild left neural foraminal stenosis.  Visualized sacrum: Normal. IMPRESSION: 1. Severe C3-4 spinal canal stenosis with compression of the spinal cord and signal change consistent with compressive myelopathy. 2. Moderate bilateral neural foraminal stenosis at C5-6. 3. No thoracic or lumbar spinal canal stenosis. 4. Mild right neural foraminal stenosis at L4-5 and L5-S1. 5. 3.5 cm abdominal aortic aneurysm. Recommend follow-up every 2 years. Reference: J Am Coll Radiol [id number] Electronically Signed: By: KUlyses JarredM.D. On: 03/23/2022 23:42   MR Lumbar Spine W Wo Contrast  Addendum Date: 03/23/2022   ADDENDUM REPORT: 03/23/2022 23:54 ADDENDUM: Critical Value/emergent results were called by telephone at the time of interpretation on 03/23/2022 at 11:54 pm [facility], who verbally acknowledged these results. Electronically Signed   By: KUlyses JarredM.D.   On: 03/23/2022 23:54   Result Date: 03/23/2022 CLINICAL DATA:  Neck and low back pain EXAM: MRI CERVICAL, THORACIC AND LUMBAR SPINE WITHOUT AND WITH CONTRAST TECHNIQUE: Multiplanar and multiecho pulse sequences of the cervical spine, to include the craniocervical junction and cervicothoracic junction, and thoracic and lumbar spine, were obtained without and  with intravenous contrast. CONTRAST:  68m GADAVIST GADOBUTROL 1 MMOL/ML IV SOLN COMPARISON:  None Available. FINDINGS: MRI CERVICAL SPINE FINDINGS Alignment: Physiologic. Vertebrae: C4-5 ACDF Cord: Hyperintense T2-weighted signal within the spinal cord at the C3-4 level. Posterior Fossa, vertebral arteries, paraspinal tissues: Negative. Disc levels: C1-2: Unremarkable. C2-3: Normal disc space and facet joints. There is no spinal canal stenosis. No neural foraminal stenosis. C3-4: Ligamentum flavum redundancy and small disc bulge. Severe spinal canal stenosis. Impingement of the spinal cord with signal change. Mild right and severe left neural foraminal stenosis. C4-5: Solid anterior arthrodesis. There is no spinal canal  stenosis. No neural foraminal stenosis. C5-6: Small disc bulge with endplate spurring. There is no spinal canal stenosis. Moderate bilateral neural foraminal stenosis. C6-7: Disc space narrowing. There is no spinal canal stenosis. Mild left neural foraminal stenosis. C7-T1: Normal disc space and facet joints. There is no spinal canal stenosis. No neural foraminal stenosis. MRI THORACIC SPINE FINDINGS Alignment:  Physiologic. Vertebrae: No fracture, evidence of discitis, or bone lesion. Cord:  Normal signal and morphology. Paraspinal and other soft tissues: Negative. Disc levels: No spinal canal or neural foraminal stenosis. MRI LUMBAR SPINE FINDINGS Segmentation:  Standard. Alignment:  Physiologic. Vertebrae:  No fracture, evidence of discitis, or bone lesion. Conus medullaris and cauda equina: Conus extends to the L2 level. Conus and cauda equina appear normal. Paraspinal and other soft tissues: 3.5 cm abdominal aortic aneurysm. Disc levels: L1-L2: Mild disc bulge with endplate spurring. No spinal canal stenosis. No neural foraminal stenosis. L2-L3: Small disc bulge with endplate spurring. No spinal canal stenosis. Mild left neural foraminal stenosis. L3-L4: Small disc bulge with endplate spurring. No spinal canal stenosis. No neural foraminal stenosis. L4-L5: Small left asymmetric disc bulge and mild facet hypertrophy. Left lateral recess narrowing without central spinal canal stenosis. Mild right neural foraminal stenosis. L5-S1: Disc space narrowing with small bulge. No spinal canal stenosis. Mild left neural foraminal stenosis. Visualized sacrum: Normal. IMPRESSION: 1. Severe C3-4 spinal canal stenosis with compression of the spinal cord and signal change consistent with compressive myelopathy. 2. Moderate bilateral neural foraminal stenosis at C5-6. 3. No thoracic or lumbar spinal canal stenosis. 4. Mild right neural foraminal stenosis at L4-5 and L5-S1. 5. 3.5 cm abdominal aortic aneurysm. Recommend follow-up  every 2 years. Reference: J Am Coll Radiol [id number] Electronically Signed: By: KUlyses JarredM.D. On: 03/23/2022 23:42    EKG: Independently reviewed.  EKG shows sinus rhythm 76 with a left bundle branch block QTc of 470 T wave inversions prominent in V5 V6, lead I/II.  > 2 D Echo 06/2021: NORMAL LEFT VENTRICULAR SYSTOLIC FUNCTION  NORMAL RIGHT VENTRICULAR SYSTOLIC FUNCTION  MILD VALVULAR REGURGITATION  MILD VALVULAR STENOSIS   >Last  GI Visit/ Dr.toledo: Colonoscopy 02/27/2013 - single large-mouthed diverticulum in rectum, multiple small and large-mouthed diverticula in ascending colon, otherwise normal examined colon. EGD 02/27/2013 - LA Grade A reflux esophagitis with GEJ biopsies showing squamocolumnar mucosa with intestinal metaplasia c/w diagnosis of Barrett's and reflux gastroesophagitis, erosive gastropathy, normal examined duodenum. Colonoscopy 10/12/2009 - fair colon prep, diverticulosis in rectum, sigmoid and transverse colon  Assessment and Plan: * Muscle weakness of upper extremity Attribute to cervical spinal cord stenosis. We will start patient on Decadron and pain control. Neurovascular checks. Neurosurgery consulted Dr. YCari Carawayon board per ED provider. Physical therapy per neurosurgery recommendations.  Ambulatory dysfunction Again attribute to his cervical spinal cord stenosis patient also has moderate to severe lumbar spine degenerative disc disease. Fall precautions. Further plan per  neurosurgery. Muscle relaxants as required. Pain control.   Essential hypertension, benign Blood pressure 114/71, pulse 84, temperature 98.2 F (36.8 C), temperature source Oral, resp. rate 18, height '6\' 2"'$  (1.88 m), weight 104.3 kg, SpO2 100 %.  Vitals:   03/23/22 1653  BP: 114/71  We will currently hold patient's metolazone and torsemide. As needed hydralazine for systolic blood pressures above 170 and above.   AAA (abdominal aortic aneurysm) (HCC) 3.5 cm  abdominal aortic aneurysm with recommendation of follow-up with imaging and specialist every 2 years. Blood pressure control currently is excellent.  Anemia Patient has a history of anemia and GI bleed. We will discuss with patient to avoid meloxicam and NSAIDs. We will also defer to primary care physician to further counsel patient on avoiding any NSAID products. Type and screen, IV PPI.  Sleep apnea CPAP per home settings.  Abnormal EKG          EKG abnormal echo normal and similar to previous one in 2012.       DVT prophylaxis:  SCD's   Code Status:  Full code    Family Communication:  Darylene Price (Son)  [phone] [facility] Phone)   Disposition Plan:  Home    Consults called:  Neurosurgery.   Admission status: Inpatient.      Para Skeans MD [facility]  6 PM- 2 AM. Please contact me via secure Chat 6 PM-2 AM. [phone] ( Pager ) To contact the [facility] Attending or Consulting provider [place] or covering provider during after hours [place], for this patient.   Check the care team in [facility] and look for a) attending/consulting [facility] provider listed and b) the [facility] team listed Log into [email or web address] and use [place]'s universal password to access. If you do not have the password, please contact the hospital operator. Locate the [facility] provider you are looking for under [facility] and page to a number that you can be directly reached. If you still have difficulty reaching the provider, please page the [facility] (Director on Call) for the Hospitalists listed on amion for assistance. [email or web address] 03/24/2022, 1:38 AM

## 2022-03-24 NOTE — Assessment & Plan Note (Addendum)
         EKG abnormal echo normal and similar to previous one in 2012.

## 2022-03-24 NOTE — Plan of Care (Signed)

## 2022-03-24 NOTE — Assessment & Plan Note (Addendum)
CPAP per home settings.   

## 2022-03-24 NOTE — Assessment & Plan Note (Signed)
Blood pressure 114/71, pulse 84, temperature 98.2 F (36.8 C), temperature source Oral, resp. rate 18, height '6\' 2"'$  (1.88 m), weight 104.3 kg, SpO2 100 %.  Vitals:   03/23/22 1653  BP: 114/71  We will currently hold patient's metolazone and torsemide. As needed hydralazine for systolic blood pressures above 170 and above.

## 2022-03-24 NOTE — ED Provider Notes (Signed)
    [name] Bean, Delice Bison, DO 03/24/22 0129

## 2022-03-24 NOTE — Assessment & Plan Note (Addendum)
s/p cervical decompression and fusion for cervical myelopathy.  -Continue pain management -PT, OT recommends SNF and TOC aware working on placement - HV removed 7/26 per neurosurgery -  follow up with neurosurgery outpatient in 2 weeks for staple removal

## 2022-03-24 NOTE — H&P (View-Only) (Signed)
Referring Physician:  No referring provider defined for this encounter.  Primary Physician:  [name] Hire, MD  History of Present Illness: 03/24/2022 [name] Bean is here today with a chief complaint of neck pain and worsening ambulation.  He is having weakness and paresthesias in his arms and legs.  He used to walk with a cane, but is now having trouble walking with a walker.  He has not had any falls.  He has previously had bladder issues, but they are currently worse.  He was evaluated and found to have severe cervical stenosis.  Past Surgery: ACDF C4-5  [name] Bean has clear symptoms of cervical myelopathy.  The symptoms are causing a significant impact on the patient's life.   Review of Systems:  A 10 point review of systems is negative, except for the pertinent positives and negatives detailed in the HPI.  Past Medical History: Past Medical History:  Diagnosis Date   Arthritis    left knee;    Hyperlipidemia    Urethral stricture     Past Surgical History: History reviewed. No pertinent surgical history.  Allergies: Allergies as of 03/23/2022 - Review Complete 03/23/2022  Allergen Reaction Noted   Amoxicillin Swelling 01/01/2015   Amoxicillin-pot clavulanate Hives, Shortness Of Breath, and Swelling 09/06/2014   Mecobalamin Nausea Only 03/20/2014    Medications: No outpatient medications have been marked as taking for the 03/23/22 encounter [facility] Encounter).    Social History: Social History   Tobacco Use   Smoking status: Former    Types: Cigarettes    Quit date: 1982    Years since quitting: 41.5   Smokeless tobacco: Never  Vaping Use   Vaping Use: Never used  Substance Use Topics   Alcohol use: No   Drug use: No    Family Medical History: Family History  Problem Relation Age of Onset   Diabetes Brother     Physical Examination: Vitals:   03/24/22 0530 03/24/22 0830  BP: 134/80 104/69  Pulse: 77 88  Resp: 18 18  Temp:  97.9  F (36.6 C)  SpO2: 98% 97%    General: Patient is well developed, well nourished, calm, collected, and in no apparent distress. Attention to examination is appropriate.  Neck:   Supple.  Limited extension.  Respiratory: Patient is breathing without any difficulty.   NEUROLOGICAL:     Awake, alert, oriented to person, place, and time.  Speech is clear and fluent. Fund of knowledge is appropriate.   Cranial Nerves: Pupils equal round and reactive to light.  Facial tone is symmetric.  Facial sensation is symmetric. Shoulder shrug is symmetric. Tongue protrusion is midline.  There is no pronator drift.  ROM of spine: full.    Strength: Side Biceps Triceps Deltoid Interossei Grip Wrist Ext. Wrist Flex.  R '5 5 5 5 4 '$ 4- 4+  L '5 5 5 5 4 3 '$ 4+   Side Iliopsoas Quads Hamstring PF DF EHL  R '4 5 5 5 5 5  '$ L '4 5 5 5 5 5   '$ Reflexes are 2+ and symmetric at the biceps, triceps, brachioradialis, patella and achilles.   Hoffman's is present.  Clonus is not present.  Toes are down-going.  Bilateral upper and lower extremity sensation is intact to light touch.    No evidence of dysmetria noted.  Gait is abnormal - untested due to instability.    Medical Decision Making  Imaging: MRI C spine 03/23/2022 Cord: Hyperintense T2-weighted signal within the  spinal cord at the C3-4 level.   Posterior Fossa, vertebral arteries, paraspinal tissues: Negative.   Disc levels:   C1-2: Unremarkable.   C2-3: Normal disc space and facet joints. There is no spinal canal stenosis. No neural foraminal stenosis.   C3-4: Ligamentum flavum redundancy and small disc bulge. Severe spinal canal stenosis. Impingement of the spinal cord with signal change. Mild right and severe left neural foraminal stenosis.   C4-5: Solid anterior arthrodesis. There is no spinal canal stenosis. No neural foraminal stenosis.   C5-6: Small disc bulge with endplate spurring. There is no spinal canal stenosis. Moderate  bilateral neural foraminal stenosis.   C6-7: Disc space narrowing. There is no spinal canal stenosis. Mild left neural foraminal stenosis.   C7-T1: Normal disc space and facet joints. There is no spinal canal stenosis. No neural foraminal stenosis.   MRI THORACIC SPINE FINDINGS   Alignment:  Physiologic.   Vertebrae: No fracture, evidence of discitis, or bone lesion.   Cord:  Normal signal and morphology.   Paraspinal and other soft tissues: Negative.   Disc levels:   No spinal canal or neural foraminal stenosis.   MRI LUMBAR SPINE FINDINGS   Segmentation:  Standard.   Alignment:  Physiologic.   Vertebrae:  No fracture, evidence of discitis, or bone lesion.   Conus medullaris and cauda equina: Conus extends to the L2 level. Conus and cauda equina appear normal.   Paraspinal and other soft tissues: 3.5 cm abdominal aortic aneurysm.   Disc levels:   L1-L2: Mild disc bulge with endplate spurring. No spinal canal stenosis. No neural foraminal stenosis.   L2-L3: Small disc bulge with endplate spurring. No spinal canal stenosis. Mild left neural foraminal stenosis.   L3-L4: Small disc bulge with endplate spurring. No spinal canal stenosis. No neural foraminal stenosis.   L4-L5: Small left asymmetric disc bulge and mild facet hypertrophy. Left lateral recess narrowing without central spinal canal stenosis. Mild right neural foraminal stenosis.   L5-S1: Disc space narrowing with small bulge. No spinal canal stenosis. Mild left neural foraminal stenosis.   Visualized sacrum: Normal.   IMPRESSION: 1. Severe C3-4 spinal canal stenosis with compression of the spinal cord and signal change consistent with compressive myelopathy. 2. Moderate bilateral neural foraminal stenosis at C5-6. 3. No thoracic or lumbar spinal canal stenosis. 4. Mild right neural foraminal stenosis at L4-5 and L5-S1. 5. 3.5 cm abdominal aortic aneurysm. Recommend follow-up every  2 years. Reference: J Am Coll Radiol 3557;[id number].   Electronically Signed: By: [name] Bean M.D. On: 03/23/2022 23:42    I have personally reviewed the images and agree with the above interpretation.  Assessment and Plan: [name] Bean is a pleasant 87 y.o. male with cervical myelopathy due to severe cervical stenosis at C3-4.  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  Given his progressive symptoms, I recommended surgical intervention with posterior cervical fusion C3-4 with decompression.  I discussed with the internal medicine specialist to ensure that Mr. Lobo is medically optimized.  He may need to have some diuresis as he reported some swelling in his legs.  We will plan for surgery on Monday.  I discussed the planned procedure at length with the patient, including the risks, benefits, alternatives, and indications. The risks discussed include but are not limited to bleeding, infection, need for reoperation, spinal fluid leak, stroke, vision loss, anesthetic complication, coma, paralysis, and even death. I also described in detail that improvement was not guaranteed.  The patient expressed understanding of  these risks, and asked that we proceed with surgery. I described the surgery in layman's terms, and gave ample opportunity for questions, which were answered to the best of my ability.    I spent a total of 30 minutes in face-to-face and non-face-to-face activities related to this patient's care today.  Thank you for involving me in the care of this patient.      Betha Shadix K. Izora Ribas MD, [facility] Neurosurgery

## 2022-03-24 NOTE — ED Notes (Signed)
Pt. Up at bedside with son to steady him. NAD. Pt. Ambulates with walker.

## 2022-03-24 NOTE — Progress Notes (Signed)
PHARMACIST - PHYSICIAN COMMUNICATION  CONCERNING: IV to Oral Route Change Policy  RECOMMENDATION: This patient is receiving pantoprazole by the intravenous route.  Based on criteria approved by the Pharmacy and Therapeutics Committee, the intravenous medication(s) is/are being converted to the equivalent oral dose form(s).   DESCRIPTION: These criteria include: The patient is eating (either orally or via tube) and/or has been taking other orally administered medications for a least 24 hours The patient has no evidence of active gastrointestinal bleeding or impaired GI absorption (gastrectomy, short bowel, patient on TNA or NPO).  If you have questions about this conversion, please contact the [place], [facility] 03/24/2022 10:40 AM

## 2022-03-24 NOTE — Assessment & Plan Note (Addendum)
3.5 cm abdominal aortic aneurysm with recommendation of follow-up with imaging and specialist every 2 years. Blood pressure control currently is excellent

## 2022-03-24 NOTE — ED Notes (Signed)
Dr. Patel at bedside 

## 2022-03-24 NOTE — Consult Note (Signed)
Referring Physician:  No referring provider defined for this encounter.  Primary Physician:  Baxter Hire, MD  History of Present Illness: 03/24/2022 [name] Bean is here today with a chief complaint of neck pain and worsening ambulation.  He is having weakness and paresthesias in his arms and legs.  He used to walk with a cane, but is now having trouble walking with a walker.  He has not had any falls.  He has previously had bladder issues, but they are currently worse.  He was evaluated and found to have severe cervical stenosis.  Past Surgery: ACDF C4-5  [name] has clear symptoms of cervical myelopathy.  The symptoms are causing a significant impact on the patient's life.   Review of Systems:  A 10 point review of systems is negative, except for the pertinent positives and negatives detailed in the HPI.  Past Medical History: Past Medical History:  Diagnosis Date   Arthritis    left knee;    Hyperlipidemia    Urethral stricture     Past Surgical History: History reviewed. No pertinent surgical history.  Allergies: Allergies as of 03/23/2022 - Review Complete 03/23/2022  Allergen Reaction Noted   Amoxicillin Swelling 01/01/2015   Amoxicillin-pot clavulanate Hives, Shortness Of Breath, and Swelling 09/06/2014   Mecobalamin Nausea Only 03/20/2014    Medications: No outpatient medications have been marked as taking for the 03/23/22 encounter [facility] Encounter).    Social History: Social History   Tobacco Use   Smoking status: Former    Types: Cigarettes    Quit date: 1982    Years since quitting: 41.5   Smokeless tobacco: Never  Vaping Use   Vaping Use: Never used  Substance Use Topics   Alcohol use: No   Drug use: No    Family Medical History: Family History  Problem Relation Age of Onset   Diabetes Brother     Physical Examination: Vitals:   03/24/22 0530 03/24/22 0830  BP: 134/80 104/69  Pulse: 77 88  Resp: 18 18  Temp:  97.9  F (36.6 C)  SpO2: 98% 97%    General: Patient is well developed, well nourished, calm, collected, and in no apparent distress. Attention to examination is appropriate.  Neck:   Supple.  Limited extension.  Respiratory: Patient is breathing without any difficulty.   NEUROLOGICAL:     Awake, alert, oriented to person, place, and time.  Speech is clear and fluent. Fund of knowledge is appropriate.   Cranial Nerves: Pupils equal round and reactive to light.  Facial tone is symmetric.  Facial sensation is symmetric. Shoulder shrug is symmetric. Tongue protrusion is midline.  There is no pronator drift.  ROM of spine: full.    Strength: Side Biceps Triceps Deltoid Interossei Grip Wrist Ext. Wrist Flex.  R '5 5 5 5 4 '$ 4- 4+  L '5 5 5 5 4 3 '$ 4+   Side Iliopsoas Quads Hamstring PF DF EHL  R '4 5 5 5 5 5  '$ L '4 5 5 5 5 5   '$ Reflexes are 2+ and symmetric at the biceps, triceps, brachioradialis, patella and achilles.   Hoffman's is present.  Clonus is not present.  Toes are down-going.  Bilateral upper and lower extremity sensation is intact to light touch.    No evidence of dysmetria noted.  Gait is abnormal - untested due to instability.    Medical Decision Making  Imaging: MRI C spine 03/23/2022 Cord: Hyperintense T2-weighted signal within the  spinal cord at the C3-4 level.   Posterior Fossa, vertebral arteries, paraspinal tissues: Negative.   Disc levels:   C1-2: Unremarkable.   C2-3: Normal disc space and facet joints. There is no spinal canal stenosis. No neural foraminal stenosis.   C3-4: Ligamentum flavum redundancy and small disc bulge. Severe spinal canal stenosis. Impingement of the spinal cord with signal change. Mild right and severe left neural foraminal stenosis.   C4-5: Solid anterior arthrodesis. There is no spinal canal stenosis. No neural foraminal stenosis.   C5-6: Small disc bulge with endplate spurring. There is no spinal canal stenosis. Moderate  bilateral neural foraminal stenosis.   C6-7: Disc space narrowing. There is no spinal canal stenosis. Mild left neural foraminal stenosis.   C7-T1: Normal disc space and facet joints. There is no spinal canal stenosis. No neural foraminal stenosis.   MRI THORACIC SPINE FINDINGS   Alignment:  Physiologic.   Vertebrae: No fracture, evidence of discitis, or bone lesion.   Cord:  Normal signal and morphology.   Paraspinal and other soft tissues: Negative.   Disc levels:   No spinal canal or neural foraminal stenosis.   MRI LUMBAR SPINE FINDINGS   Segmentation:  Standard.   Alignment:  Physiologic.   Vertebrae:  No fracture, evidence of discitis, or bone lesion.   Conus medullaris and cauda equina: Conus extends to the L2 level. Conus and cauda equina appear normal.   Paraspinal and other soft tissues: 3.5 cm abdominal aortic aneurysm.   Disc levels:   L1-L2: Mild disc bulge with endplate spurring. No spinal canal stenosis. No neural foraminal stenosis.   L2-L3: Small disc bulge with endplate spurring. No spinal canal stenosis. Mild left neural foraminal stenosis.   L3-L4: Small disc bulge with endplate spurring. No spinal canal stenosis. No neural foraminal stenosis.   L4-L5: Small left asymmetric disc bulge and mild facet hypertrophy. Left lateral recess narrowing without central spinal canal stenosis. Mild right neural foraminal stenosis.   L5-S1: Disc space narrowing with small bulge. No spinal canal stenosis. Mild left neural foraminal stenosis.   Visualized sacrum: Normal.   IMPRESSION: 1. Severe C3-4 spinal canal stenosis with compression of the spinal cord and signal change consistent with compressive myelopathy. 2. Moderate bilateral neural foraminal stenosis at C5-6. 3. No thoracic or lumbar spinal canal stenosis. 4. Mild right neural foraminal stenosis at L4-5 and L5-S1. 5. 3.5 cm abdominal aortic aneurysm. Recommend follow-up every  2 years. Reference: J Am Coll Radiol 2952;84:[id number].   Electronically Signed: By: Ulyses Jarred M.D. On: 03/23/2022 23:42    I have personally reviewed the images and agree with the above interpretation.  Assessment and Plan: [name] is a pleasant 87 y.o. male with cervical myelopathy due to severe cervical stenosis at C3-4.  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  Given his progressive symptoms, I recommended surgical intervention with posterior cervical fusion C3-4 with decompression.  I discussed with the internal medicine specialist to ensure that Mr. Mall is medically optimized.  He may need to have some diuresis as he reported some swelling in his legs.  We will plan for surgery on Monday.  I discussed the planned procedure at length with the patient, including the risks, benefits, alternatives, and indications. The risks discussed include but are not limited to bleeding, infection, need for reoperation, spinal fluid leak, stroke, vision loss, anesthetic complication, coma, paralysis, and even death. I also described in detail that improvement was not guaranteed.  The patient expressed understanding of  these risks, and asked that we proceed with surgery. I described the surgery in layman's terms, and gave ample opportunity for questions, which were answered to the best of my ability.    I spent a total of 30 minutes in face-to-face and non-face-to-face activities related to this patient's care today.  Thank you for involving me in the care of this patient.      Payal Stanforth K. Izora Ribas MD, [facility] Neurosurgery

## 2022-03-25 DIAGNOSIS — M6281 Muscle weakness (generalized): Secondary | ICD-10-CM | POA: Diagnosis not present

## 2022-03-25 LAB — BASIC METABOLIC PANEL
Anion gap: 7 (ref 5–15)
BUN: 24 mg/dL — ABNORMAL HIGH (ref 8–23)
CO2: 26 mmol/L (ref 22–32)
Calcium: 9.5 mg/dL (ref 8.9–10.3)
Chloride: 105 mmol/L (ref 98–111)
Creatinine, Ser: 0.98 mg/dL (ref 0.61–1.24)
GFR, Estimated: >60 mL/min (ref >60)
Glucose, Bld: 110 mg/dL — ABNORMAL HIGH (ref 70–99)
Potassium: 3.6 mmol/L (ref 3.5–5.1)
Sodium: 138 mmol/L (ref 135–145)

## 2022-03-25 MED ORDER — FUROSEMIDE 20 MG PO TABS
20.0000 mg | ORAL_TABLET | Freq: Every day | ORAL | Status: DC
  Administered 2022-03-26: 20 mg via ORAL
  Filled 2022-03-25: qty 1

## 2022-03-25 MED ORDER — ENOXAPARIN SODIUM 40 MG/0.4ML IJ SOSY
40.0000 mg | PREFILLED_SYRINGE | Freq: Once | INTRAMUSCULAR | Status: AC
Start: 2022-03-25 — End: 2022-03-25
  Administered 2022-03-25: 40 mg via SUBCUTANEOUS
  Filled 2022-03-25: qty 0.4

## 2022-03-25 MED ORDER — POTASSIUM CHLORIDE CRYS ER 20 MEQ PO TBCR
20.0000 meq | EXTENDED_RELEASE_TABLET | Freq: Once | ORAL | Status: AC
Start: 2022-03-25 — End: 2022-03-25
  Administered 2022-03-25: 20 meq via ORAL
  Filled 2022-03-25: qty 1

## 2022-03-25 MED ORDER — POTASSIUM CHLORIDE CRYS ER 20 MEQ PO TBCR: 20.0000 meq | EXTENDED_RELEASE_TABLET | Freq: Every day | ORAL | Status: DC

## 2022-03-25 NOTE — Progress Notes (Addendum)
PROGRESS NOTE    [name] Bean  XQJ:[id number] DOB: [birth year] DOA: 03/23/2022 PCP: Baxter Hire, MD    Brief Narrative:  [name] Bean is a 87 y.o. male seen in ed with complaints of neck pain. Pt has had previous cervical laminectomy.  Patient at baseline ambulates with a cane. Today patient reports paresthesias in both his arms as well as his legs and difficulty with ambulation and weakness.Pt has severe spinal stenosis on MRI done today and case was discussed with neurosurgery on-call, Dr. Cari Caraway   Patient reported has not been ambulatory much for the past couple of weeks due to these above issues.  Has not been taking his Lasix for the past week  7/22 no sob or cp. No overnight issues  Consultants:  Surgery  Procedures:   Antimicrobials:      Subjective: Note no pain.  No new complaints  Objective: Vitals:   03/25/22 0258 03/25/22 0302 03/25/22 0745 03/25/22 1556  BP: (!) 102/54  108/62 (!) 104/57  Pulse: 71  67 76  Resp: '17  18 18  '$ Temp: 98.1 F (36.7 C)  98.5 F (36.9 C) 98.5 F (36.9 C)  TempSrc:   Oral Oral  SpO2: 96%  97% 99%  Weight:  109.5 kg    Height:        Intake/Output Summary (Last 24 hours) at 03/25/2022 1657 Last data filed at 03/25/2022 0743 Gross per 24 hour  Intake --  Output 750 ml  Net -750 ml   Filed Weights   03/23/22 1651 03/25/22 0302  Weight: 104.3 kg 109.5 kg    Examination: Calm, NAD Cta no w/r Reg s1/s2 no gallop Soft benign +bs +edema but better than yesterday Aaoxox3  Mood and affect appropriate in current setting     Data Reviewed: I have personally reviewed following labs and imaging studies  CBC: Recent Labs  Lab 03/23/22 1655 03/24/22 0500  WBC 7.0 5.6  NEUTROABS 3.0  --   HGB 11.1* 11.4*  HCT 34.6* 35.6*  MCV 88.5 88.1  PLT 204 448   Basic Metabolic Panel: Recent Labs  Lab 03/23/22 1655 03/24/22 0500 03/25/22 0951  NA 140 140 138  K 3.9 4.0 3.6  CL 100 108 105  CO2 '28 28 26  '$ GLUCOSE  100* 108* 110*  BUN 22 18 24*  CREATININE 1.08 0.87 0.98  CALCIUM 9.6 9.7 9.5   GFR: Estimated Creatinine Clearance: 72.6 mL/min (by C-G formula based on SCr of 0.98 mg/dL). Liver Function Tests: Recent Labs  Lab 03/23/22 1655 03/24/22 0500  AST 29 27  ALT 25 24  ALKPHOS 98 106  BILITOT 0.6 0.8  PROT 7.2 6.9  ALBUMIN 4.0 3.6   Recent Labs  Lab 03/23/22 1655  LIPASE 24   No results for input(s): "AMMONIA" in the last 168 hours. Coagulation Profile: No results for input(s): "INR", "PROTIME" in the last 168 hours. Cardiac Enzymes: Recent Labs  Lab 03/23/22 1655  CKTOTAL 70   BNP (last 3 results) No results for input(s): "PROBNP" in the last 8760 hours. HbA1C: Recent Labs    03/24/22 0500  HGBA1C 5.6   CBG: No results for input(s): "GLUCAP" in the last 168 hours. Lipid Profile: No results for input(s): "CHOL", "HDL", "LDLCALC", "TRIG", "CHOLHDL", "LDLDIRECT" in the last 72 hours. Thyroid Function Tests: Recent Labs    03/24/22 0500  TSH 1.307   Anemia Panel: No results for input(s): "VITAMINB12", "FOLATE", "FERRITIN", "TIBC", "IRON", "RETICCTPCT" in the last 72 hours. Sepsis Labs: No  results for input(s): "PROCALCITON", "LATICACIDVEN" in the last 168 hours.  No results found for this or any previous visit (from the past 240 hour(s)).       Radiology Studies: US Venous Img Lower Bilateral (DVT)  Result Date: 03/24/2022 CLINICAL DATA:  Bilateral lower extremity swelling. EXAM: BILATERAL LOWER EXTREMITY VENOUS DOPPLER ULTRASOUND TECHNIQUE: Gray-scale sonography with graded compression, as well as color Doppler and duplex ultrasound were performed to evaluate the lower extremity deep venous systems from the level of the common femoral vein and including the common femoral, femoral, profunda femoral, popliteal and calf veins including the posterior tibial, peroneal and gastrocnemius veins when visible. The superficial great saphenous vein was also interrogated.  Spectral Doppler was utilized to evaluate flow at rest and with distal augmentation maneuvers in the common femoral, femoral and popliteal veins. COMPARISON:  None Available. FINDINGS: RIGHT LOWER EXTREMITY Common Femoral Vein: No evidence of thrombus. Normal compressibility, respiratory phasicity and response to augmentation. Saphenofemoral Junction: No evidence of thrombus. Normal compressibility and flow on color Doppler imaging. Profunda Femoral Vein: No evidence of thrombus. Normal compressibility and flow on color Doppler imaging. Femoral Vein: No evidence of thrombus. Normal compressibility, respiratory phasicity and response to augmentation. Popliteal Vein: No evidence of thrombus. Normal compressibility, respiratory phasicity and response to augmentation. Calf Veins: Limited visualization due to edema. No evidence of thrombus. Normal compressibility and flow on color Doppler imaging. LEFT LOWER EXTREMITY Common Femoral Vein: No evidence of thrombus. Normal compressibility, respiratory phasicity and response to augmentation. Saphenofemoral Junction: No evidence of thrombus. Normal compressibility and flow on color Doppler imaging. Profunda Femoral Vein: No evidence of thrombus. Normal compressibility and flow on color Doppler imaging. Femoral Vein: No evidence of thrombus. Normal compressibility, respiratory phasicity and response to augmentation. Popliteal Vein: No evidence of thrombus. Normal compressibility, respiratory phasicity and response to augmentation. Calf Veins: Limited visualization due to edema no evidence of thrombus. Normal compressibility and flow on color Doppler imaging. IMPRESSION: No evidence of deep venous thrombosis in either lower extremity with limited visualization of the calf veins due to edema. Electronically Signed   By: Margaretha Sheffield M.D.   On: 03/24/2022 14:46   MR Cervical Spine W or Wo Contrast  Addendum Date: 03/23/2022   ADDENDUM REPORT: 03/23/2022 23:54 ADDENDUM:  Critical Value/emergent results were called by telephone at the time of interpretation on 03/23/2022 at 11:54 pm to provider [facility] , who verbally acknowledged these results. Electronically Signed   By: Ulyses Jarred M.D.   On: 03/23/2022 23:54   Result Date: 03/23/2022 CLINICAL DATA:  Neck and low back pain EXAM: MRI CERVICAL, THORACIC AND LUMBAR SPINE WITHOUT AND WITH CONTRAST TECHNIQUE: Multiplanar and multiecho pulse sequences of the cervical spine, to include the craniocervical junction and cervicothoracic junction, and thoracic and lumbar spine, were obtained without and with intravenous contrast. CONTRAST:  84m GADAVIST GADOBUTROL 1 MMOL/ML IV SOLN COMPARISON:  None Available. FINDINGS: MRI CERVICAL SPINE FINDINGS Alignment: Physiologic. Vertebrae: C4-5 ACDF Cord: Hyperintense T2-weighted signal within the spinal cord at the C3-4 level. Posterior Fossa, vertebral arteries, paraspinal tissues: Negative. Disc levels: C1-2: Unremarkable. C2-3: Normal disc space and facet joints. There is no spinal canal stenosis. No neural foraminal stenosis. C3-4: Ligamentum flavum redundancy and small disc bulge. Severe spinal canal stenosis. Impingement of the spinal cord with signal change. Mild right and severe left neural foraminal stenosis. C4-5: Solid anterior arthrodesis. There is no spinal canal stenosis. No neural foraminal stenosis. C5-6: Small disc bulge with endplate spurring. There is  no spinal canal stenosis. Moderate bilateral neural foraminal stenosis. C6-7: Disc space narrowing. There is no spinal canal stenosis. Mild left neural foraminal stenosis. C7-T1: Normal disc space and facet joints. There is no spinal canal stenosis. No neural foraminal stenosis. MRI THORACIC SPINE FINDINGS Alignment:  Physiologic. Vertebrae: No fracture, evidence of discitis, or bone lesion. Cord:  Normal signal and morphology. Paraspinal and other soft tissues: Negative. Disc levels: No spinal canal or neural foraminal  stenosis. MRI LUMBAR SPINE FINDINGS Segmentation:  Standard. Alignment:  Physiologic. Vertebrae:  No fracture, evidence of discitis, or bone lesion. Conus medullaris and cauda equina: Conus extends to the L2 level. Conus and cauda equina appear normal. Paraspinal and other soft tissues: 3.5 cm abdominal aortic aneurysm. Disc levels: L1-L2: Mild disc bulge with endplate spurring. No spinal canal stenosis. No neural foraminal stenosis. L2-L3: Small disc bulge with endplate spurring. No spinal canal stenosis. Mild left neural foraminal stenosis. L3-L4: Small disc bulge with endplate spurring. No spinal canal stenosis. No neural foraminal stenosis. L4-L5: Small left asymmetric disc bulge and mild facet hypertrophy. Left lateral recess narrowing without central spinal canal stenosis. Mild right neural foraminal stenosis. L5-S1: Disc space narrowing with small bulge. No spinal canal stenosis. Mild left neural foraminal stenosis. Visualized sacrum: Normal. IMPRESSION: 1. Severe C3-4 spinal canal stenosis with compression of the spinal cord and signal change consistent with compressive myelopathy. 2. Moderate bilateral neural foraminal stenosis at C5-6. 3. No thoracic or lumbar spinal canal stenosis. 4. Mild right neural foraminal stenosis at L4-5 and L5-S1. 5. 3.5 cm abdominal aortic aneurysm. Recommend follow-up every 2 years. Reference: J Am Coll Radiol 0175;10:[id number]. Electronically Signed: By: Ulyses Jarred M.D. On: 03/23/2022 23:42   MR THORACIC SPINE W WO CONTRAST  Addendum Date: 03/23/2022   ADDENDUM REPORT: 03/23/2022 23:54 ADDENDUM: Critical Value/emergent results were called by telephone at the time of interpretation on 03/23/2022 at 11:54 pm to provider [facility] , who verbally acknowledged these results. Electronically Signed   By: Ulyses Jarred M.D.   On: 03/23/2022 23:54   Result Date: 03/23/2022 CLINICAL DATA:  Neck and low back pain EXAM: MRI CERVICAL, THORACIC AND LUMBAR SPINE WITHOUT AND WITH  CONTRAST TECHNIQUE: Multiplanar and multiecho pulse sequences of the cervical spine, to include the craniocervical junction and cervicothoracic junction, and thoracic and lumbar spine, were obtained without and with intravenous contrast. CONTRAST:  63m GADAVIST GADOBUTROL 1 MMOL/ML IV SOLN COMPARISON:  None Available. FINDINGS: MRI CERVICAL SPINE FINDINGS Alignment: Physiologic. Vertebrae: C4-5 ACDF Cord: Hyperintense T2-weighted signal within the spinal cord at the C3-4 level. Posterior Fossa, vertebral arteries, paraspinal tissues: Negative. Disc levels: C1-2: Unremarkable. C2-3: Normal disc space and facet joints. There is no spinal canal stenosis. No neural foraminal stenosis. C3-4: Ligamentum flavum redundancy and small disc bulge. Severe spinal canal stenosis. Impingement of the spinal cord with signal change. Mild right and severe left neural foraminal stenosis. C4-5: Solid anterior arthrodesis. There is no spinal canal stenosis. No neural foraminal stenosis. C5-6: Small disc bulge with endplate spurring. There is no spinal canal stenosis. Moderate bilateral neural foraminal stenosis. C6-7: Disc space narrowing. There is no spinal canal stenosis. Mild left neural foraminal stenosis. C7-T1: Normal disc space and facet joints. There is no spinal canal stenosis. No neural foraminal stenosis. MRI THORACIC SPINE FINDINGS Alignment:  Physiologic. Vertebrae: No fracture, evidence of discitis, or bone lesion. Cord:  Normal signal and morphology. Paraspinal and other soft tissues: Negative. Disc levels: No spinal canal or neural foraminal stenosis. MRI LUMBAR SPINE  FINDINGS Segmentation:  Standard. Alignment:  Physiologic. Vertebrae:  No fracture, evidence of discitis, or bone lesion. Conus medullaris and cauda equina: Conus extends to the L2 level. Conus and cauda equina appear normal. Paraspinal and other soft tissues: 3.5 cm abdominal aortic aneurysm. Disc levels: L1-L2: Mild disc bulge with endplate spurring. No  spinal canal stenosis. No neural foraminal stenosis. L2-L3: Small disc bulge with endplate spurring. No spinal canal stenosis. Mild left neural foraminal stenosis. L3-L4: Small disc bulge with endplate spurring. No spinal canal stenosis. No neural foraminal stenosis. L4-L5: Small left asymmetric disc bulge and mild facet hypertrophy. Left lateral recess narrowing without central spinal canal stenosis. Mild right neural foraminal stenosis. L5-S1: Disc space narrowing with small bulge. No spinal canal stenosis. Mild left neural foraminal stenosis. Visualized sacrum: Normal. IMPRESSION: 1. Severe C3-4 spinal canal stenosis with compression of the spinal cord and signal change consistent with compressive myelopathy. 2. Moderate bilateral neural foraminal stenosis at C5-6. 3. No thoracic or lumbar spinal canal stenosis. 4. Mild right neural foraminal stenosis at L4-5 and L5-S1. 5. 3.5 cm abdominal aortic aneurysm. Recommend follow-up every 2 years. Reference: J Am Coll Radiol 9417;40:[id number]. Electronically Signed: By: Ulyses Jarred M.D. On: 03/23/2022 23:42   MR Lumbar Spine W Wo Contrast  Addendum Date: 03/23/2022   ADDENDUM REPORT: 03/23/2022 23:54 ADDENDUM: Critical Value/emergent results were called by telephone at the time of interpretation on 03/23/2022 at 11:54 pm to provider [facility] , who verbally acknowledged these results. Electronically Signed   By: Ulyses Jarred M.D.   On: 03/23/2022 23:54   Result Date: 03/23/2022 CLINICAL DATA:  Neck and low back pain EXAM: MRI CERVICAL, THORACIC AND LUMBAR SPINE WITHOUT AND WITH CONTRAST TECHNIQUE: Multiplanar and multiecho pulse sequences of the cervical spine, to include the craniocervical junction and cervicothoracic junction, and thoracic and lumbar spine, were obtained without and with intravenous contrast. CONTRAST:  83m GADAVIST GADOBUTROL 1 MMOL/ML IV SOLN COMPARISON:  None Available. FINDINGS: MRI CERVICAL SPINE FINDINGS Alignment: Physiologic.  Vertebrae: C4-5 ACDF Cord: Hyperintense T2-weighted signal within the spinal cord at the C3-4 level. Posterior Fossa, vertebral arteries, paraspinal tissues: Negative. Disc levels: C1-2: Unremarkable. C2-3: Normal disc space and facet joints. There is no spinal canal stenosis. No neural foraminal stenosis. C3-4: Ligamentum flavum redundancy and small disc bulge. Severe spinal canal stenosis. Impingement of the spinal cord with signal change. Mild right and severe left neural foraminal stenosis. C4-5: Solid anterior arthrodesis. There is no spinal canal stenosis. No neural foraminal stenosis. C5-6: Small disc bulge with endplate spurring. There is no spinal canal stenosis. Moderate bilateral neural foraminal stenosis. C6-7: Disc space narrowing. There is no spinal canal stenosis. Mild left neural foraminal stenosis. C7-T1: Normal disc space and facet joints. There is no spinal canal stenosis. No neural foraminal stenosis. MRI THORACIC SPINE FINDINGS Alignment:  Physiologic. Vertebrae: No fracture, evidence of discitis, or bone lesion. Cord:  Normal signal and morphology. Paraspinal and other soft tissues: Negative. Disc levels: No spinal canal or neural foraminal stenosis. MRI LUMBAR SPINE FINDINGS Segmentation:  Standard. Alignment:  Physiologic. Vertebrae:  No fracture, evidence of discitis, or bone lesion. Conus medullaris and cauda equina: Conus extends to the L2 level. Conus and cauda equina appear normal. Paraspinal and other soft tissues: 3.5 cm abdominal aortic aneurysm. Disc levels: L1-L2: Mild disc bulge with endplate spurring. No spinal canal stenosis. No neural foraminal stenosis. L2-L3: Small disc bulge with endplate spurring. No spinal canal stenosis. Mild left neural foraminal stenosis. L3-L4: Small disc bulge  with endplate spurring. No spinal canal stenosis. No neural foraminal stenosis. L4-L5: Small left asymmetric disc bulge and mild facet hypertrophy. Left lateral recess narrowing without central  spinal canal stenosis. Mild right neural foraminal stenosis. L5-S1: Disc space narrowing with small bulge. No spinal canal stenosis. Mild left neural foraminal stenosis. Visualized sacrum: Normal. IMPRESSION: 1. Severe C3-4 spinal canal stenosis with compression of the spinal cord and signal change consistent with compressive myelopathy. 2. Moderate bilateral neural foraminal stenosis at C5-6. 3. No thoracic or lumbar spinal canal stenosis. 4. Mild right neural foraminal stenosis at L4-5 and L5-S1. 5. 3.5 cm abdominal aortic aneurysm. Recommend follow-up every 2 years. Reference: J Am Coll Radiol 9628;36:[id number]. Electronically Signed: By: Ulyses Jarred M.D. On: 03/23/2022 23:42        Scheduled Meds:  dexamethasone (DECADRON) injection  4 mg Intravenous Q12H   [START ON 03/26/2022] furosemide  20 mg Oral Daily   pantoprazole  40 mg Oral BID   potassium chloride  20 mEq Oral Once   sodium chloride flush  3 mL Intravenous Q12H   tamsulosin  0.4 mg Oral Daily   Continuous Infusions:  Assessment & Plan:   Principal Problem:   Muscle weakness of upper extremity Active Problems:   Ambulatory dysfunction   Essential hypertension, benign   AAA (abdominal aortic aneurysm) (HCC)   Anemia   Sleep apnea   Iliac artery aneurysm (HCC)   Lymphedema of both lower extremities   Abnormal EKG   Muscle weakness of upper extremity Attribute to cervical spinal cord stenosis. We will start patient on Decadron and pain control. Neurovascular checks. 7/22 neurosurgery consulted input appreciated.  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 7/22 fall precautions Surgery on Monday Pain mx/muscle relaxants    Iliac artery aneurysm AAA Saw vascular in 5/23. Stable, check in  one year Ct here -plz see  OSA Noncompliant with cpap  Essential HTN Stable Continue current regimen  Lymphedema bilateral lower extremity Followed by vascular Was not taking his Lasix for the past week, we resumed it We will decrease dose to 20 mg daily Monitor electrolytes  Chronic anemia No evidence of bleed monitor  DVT prophylaxis: lovenox, hold Sunday dose Code Status: Full Family Communication: Son at bedside Disposition Plan:  Status is: Inpatient Remains inpatient appropriate because: IV treatment, surgery on Monday        LOS: 1 day   Time spent: 70 min    Nolberto Hanlon, MD [facility] Pager [phone]  If 7PM-7AM, please contact night-coverage 03/25/2022, 4:57 PM

## 2022-03-25 NOTE — Consult Note (Addendum)
ANTICOAGULATION CONSULT NOTE - Initial Consult  Pharmacy Consult for Enoxaparin Indication: VTE prophylaxis  Allergies  Allergen Reactions   Amoxicillin Swelling   Amoxicillin-Pot Clavulanate Hives, Shortness Of Breath and Swelling    Pt states he had to go to the hospital because of this situation Pt states he had to go to the hospital because of this situation    Mecobalamin Nausea Only    Patient Measurements: Height: '6\' 2"'$  (188 cm) Weight: 109.5 kg (241 lb 6.5 oz) IBW/kg (Calculated) : 82.2  Vital Signs: Temp: 98.5 F (36.9 C) (07/22 1556) Temp Source: Oral (07/22 1556) BP: 104/57 (07/22 1556) Pulse Rate: 76 (07/22 1556)  Labs: Recent Labs    03/23/22 1655 03/24/22 0500 03/25/22 0951  HGB 11.1* 11.4*  --   HCT 34.6* 35.6*  --   PLT 204 204  --   CREATININE 1.08 0.87 0.98  CKTOTAL 70  --   --   TROPONINIHS 9  --   --     Estimated Creatinine Clearance: 72.6 mL/min (by C-G formula based on SCr of 0.98 mg/dL).  Medical History: Past Medical History:  Diagnosis Date   Arthritis    left knee;    Hyperlipidemia    Urethral stricture    Assessment: 87yo male with PHM of chronic arthritis, HTN, HLD,  chronic lymphedema, GERD, obesity, OSA, AAA, and diverticulitis. Presents to ED complaining of neck pain and paresthesias in both his arms as well as his legs and difficulty with ambulation . Noted status post cervical laminectomy.  Planned surgery on Monday 7/24 for  posterior cervical fusion C3-4 with decompression.   Pharmacy consulted by hospitalist to dose enoxaparin for DVT prophylaxis but HOLD Sunday dose in preparation for surgery on Monday.    Plan:  Lovenox '40mg'$  [place] x 1 dose today (at least 36 hrs prior to surgery). Noted patient also on SCDs. No heparin of LMWH on Sunday Resume LMWH or heparin after surgery per neurosurgeon's  recommendations.  Aundria Bitterman Rodriguez-Guzman PharmD, BCPS 03/25/2022 5:12 PM

## 2022-03-26 DIAGNOSIS — M6281 Muscle weakness (generalized): Secondary | ICD-10-CM | POA: Diagnosis not present

## 2022-03-26 LAB — SURGICAL PCR SCREEN
MRSA, PCR: NEGATIVE
Staphylococcus aureus: NEGATIVE

## 2022-03-26 LAB — POTASSIUM: Potassium: 3.7 mmol/L (ref 3.5–5.1)

## 2022-03-26 NOTE — Progress Notes (Signed)
PROGRESS NOTE    [name] Bean  HEN:[id number] DOB: March 18, 1935 DOA: 03/23/2022 PCP: Baxter Hire, MD    Brief Narrative:  [name] Bean is a 87 y.o. male seen in ed with complaints of neck pain. Pt has had previous cervical laminectomy.  Patient at baseline ambulates with a cane. Today patient reports paresthesias in both his arms as well as his legs and difficulty with ambulation and weakness.Pt has severe spinal stenosis on MRI done today and case was discussed with neurosurgery on-call, Dr. Cari Caraway   Patient reported has not been ambulatory much for the past couple of weeks due to these above issues.  Has not been taking his Lasix for the past week  7/23 no new complaints this AM.    Consultants:  Neurosurgery, cardiology  Procedures:   Antimicrobials:      Subjective: Urinating well, no shortness of breath or dizziness or chest pain  Objective: Vitals:   03/25/22 1556 03/25/22 1928 03/26/22 0304 03/26/22 0748  BP: (!) 104/57 (!) 95/55 109/61 122/65  Pulse: 76 79 74 (!) 59  Resp: '18 18 18 18  '$ Temp: 98.5 F (36.9 C) 98.3 F (36.8 C) 98.6 F (37 C) 97.7 F (36.5 C)  TempSrc: Oral Oral Oral   SpO2: 99% 97% 100% 97%  Weight:   111 kg   Height:        Intake/Output Summary (Last 24 hours) at 03/26/2022 0856 Last data filed at 03/26/2022 3614 Gross per 24 hour  Intake 150 ml  Output 2400 ml  Net -2250 ml   Filed Weights   03/23/22 1651 03/25/22 0302 03/26/22 0304  Weight: 104.3 kg 109.5 kg 111 kg    Examination:  Calm, NAD Cta no w/r Reg s1/s2 no gallop Soft benign +bs +pitting LE edema b/l, improving Aaoxox3  Mood and affect appropriate in current setting     Data Reviewed: I have personally reviewed following labs and imaging studies  CBC: Recent Labs  Lab 03/23/22 1655 03/24/22 0500  WBC 7.0 5.6  NEUTROABS 3.0  --   HGB 11.1* 11.4*  HCT 34.6* 35.6*  MCV 88.5 88.1  PLT 204 431   Basic Metabolic Panel: Recent Labs  Lab 03/23/22 1655  03/24/22 0500 03/25/22 0951  NA 140 140 138  K 3.9 4.0 3.6  CL 100 108 105  CO2 '28 28 26  '$ GLUCOSE 100* 108* 110*  BUN 22 18 24*  CREATININE 1.08 0.87 0.98  CALCIUM 9.6 9.7 9.5   GFR: Estimated Creatinine Clearance: 73 mL/min (by C-G formula based on SCr of 0.98 mg/dL). Liver Function Tests: Recent Labs  Lab 03/23/22 1655 03/24/22 0500  AST 29 27  ALT 25 24  ALKPHOS 98 106  BILITOT 0.6 0.8  PROT 7.2 6.9  ALBUMIN 4.0 3.6   Recent Labs  Lab 03/23/22 1655  LIPASE 24   No results for input(s): "AMMONIA" in the last 168 hours. Coagulation Profile: No results for input(s): "INR", "PROTIME" in the last 168 hours. Cardiac Enzymes: Recent Labs  Lab 03/23/22 1655  CKTOTAL 70   BNP (last 3 results) No results for input(s): "PROBNP" in the last 8760 hours. HbA1C: Recent Labs    03/24/22 0500  HGBA1C 5.6   CBG: No results for input(s): "GLUCAP" in the last 168 hours. Lipid Profile: No results for input(s): "CHOL", "HDL", "LDLCALC", "TRIG", "CHOLHDL", "LDLDIRECT" in the last 72 hours. Thyroid Function Tests: Recent Labs    03/24/22 0500  TSH 1.307   Anemia Panel: No results for  input(s): "VITAMINB12", "FOLATE", "FERRITIN", "TIBC", "IRON", "RETICCTPCT" in the last 72 hours. Sepsis Labs: No results for input(s): "PROCALCITON", "LATICACIDVEN" in the last 168 hours.  No results found for this or any previous visit (from the past 240 hour(s)).       Radiology Studies: US Venous Img Lower Bilateral (DVT)  Result Date: 03/24/2022 CLINICAL DATA:  Bilateral lower extremity swelling. EXAM: BILATERAL LOWER EXTREMITY VENOUS DOPPLER ULTRASOUND TECHNIQUE: Gray-scale sonography with graded compression, as well as color Doppler and duplex ultrasound were performed to evaluate the lower extremity deep venous systems from the level of the common femoral vein and including the common femoral, femoral, profunda femoral, popliteal and calf veins including the posterior tibial,  peroneal and gastrocnemius veins when visible. The superficial great saphenous vein was also interrogated. Spectral Doppler was utilized to evaluate flow at rest and with distal augmentation maneuvers in the common femoral, femoral and popliteal veins. COMPARISON:  None Available. FINDINGS: RIGHT LOWER EXTREMITY Common Femoral Vein: No evidence of thrombus. Normal compressibility, respiratory phasicity and response to augmentation. Saphenofemoral Junction: No evidence of thrombus. Normal compressibility and flow on color Doppler imaging. Profunda Femoral Vein: No evidence of thrombus. Normal compressibility and flow on color Doppler imaging. Femoral Vein: No evidence of thrombus. Normal compressibility, respiratory phasicity and response to augmentation. Popliteal Vein: No evidence of thrombus. Normal compressibility, respiratory phasicity and response to augmentation. Calf Veins: Limited visualization due to edema. No evidence of thrombus. Normal compressibility and flow on color Doppler imaging. LEFT LOWER EXTREMITY Common Femoral Vein: No evidence of thrombus. Normal compressibility, respiratory phasicity and response to augmentation. Saphenofemoral Junction: No evidence of thrombus. Normal compressibility and flow on color Doppler imaging. Profunda Femoral Vein: No evidence of thrombus. Normal compressibility and flow on color Doppler imaging. Femoral Vein: No evidence of thrombus. Normal compressibility, respiratory phasicity and response to augmentation. Popliteal Vein: No evidence of thrombus. Normal compressibility, respiratory phasicity and response to augmentation. Calf Veins: Limited visualization due to edema no evidence of thrombus. Normal compressibility and flow on color Doppler imaging. IMPRESSION: No evidence of deep venous thrombosis in either lower extremity with limited visualization of the calf veins due to edema. Electronically Signed   By: Margaretha Sheffield M.D.   On: 03/24/2022 14:46         Scheduled Meds:  furosemide  20 mg Oral Daily   pantoprazole  40 mg Oral BID   sodium chloride flush  3 mL Intravenous Q12H   tamsulosin  0.4 mg Oral Daily   Continuous Infusions:  Assessment & Plan:   Principal Problem:   Muscle weakness of upper extremity Active Problems:   Ambulatory dysfunction   Essential hypertension, benign   AAA (abdominal aortic aneurysm) (HCC)   Anemia   Sleep apnea   Iliac artery aneurysm (HCC)   Lymphedema of both lower extremities   Abnormal EKG   Muscle weakness of upper extremity Attribute to cervical spinal cord stenosis. We will start patient on Decadron and pain control. Neurovascular checks. 7/22 neurosurgery consulted input appreciated.  Recommend surgical intervention with posterior cervical fusion C3-4 with decompression 7/23 cardiology was consulted for cardiac clearance pending  Plan for surgery in a.m.   Ambulatory dysfunction Again attribute to his cervical spinal cord stenosis patient also has moderate to severe lumbar spine degenerative disc disease. 7/23 fall precautions  Surgery in a.m.  Pain management and muscle relaxants      Iliac artery aneurysm AAA Saw vascular in 5/23. Stable, check in one year 7/23 bp control  Ct here -see results  OSA Noncompliant with CPAP  Essential HTN Stable Continue current regimen  Lymphedema bilateral lower extremity Followed by vascular Was not taking his Lasix for the past week, we resumed it 7/23 edema improving with lasix Continue current regimen  Chronic anemia No evidence of bleed monitor  DVT prophylaxis: lovenox, hold Sunday dose Code Status: Full Family Communication: none at bedside Disposition Plan:  Status is: Inpatient Remains inpatient appropriate because: IV treatment, surgery on Monday        LOS: 2 days   Time spent 37 min    Nolberto Hanlon, MD [facility] Pager [phone]  If 7PM-7AM, please contact  night-coverage 03/26/2022, 8:56 AM

## 2022-03-26 NOTE — Plan of Care (Signed)
  Problem: Clinical Measurements: Goal: Cardiovascular complication will be avoided Outcome: Not Progressing   Problem: Safety: Goal: Ability to remain free from injury will improve Outcome: Not Progressing   

## 2022-03-26 NOTE — TOC Initial Note (Signed)
Transition of Care [facility]) - Initial/Assessment Note    Patient Details  Name: [name] Bean MRN: [id number] Date of Birth: [birth year]  Transition of Care [facility]) CM/SW Contact:    Beverly Sessions, RN Phone Number: 03/26/2022, 1:15 PM  Clinical Narrative:                    [facility] acknowledges consult.  Plan for neurosurgery tomorrow.  Requested PT eval with patient medically appropriate.  - Patient from home alone, PCP [place]       Patient Goals and CMS Choice        Expected Discharge Plan and Services                                                Prior Living Arrangements/Services                       Activities of Daily Living Home Assistive Devices/Equipment: Cane (specify quad or straight), Walker (specify type) ADL Screening (condition at time of admission) Patient's cognitive ability adequate to safely complete daily activities?: Yes Is the patient deaf or have difficulty hearing?: No Does the patient have difficulty seeing, even when wearing glasses/contacts?: No Does the patient have difficulty concentrating, remembering, or making decisions?: No Patient able to express need for assistance with ADLs?: Yes Does the patient have difficulty dressing or bathing?: Yes Independently performs ADLs?: No Communication: Needs assistance Is this a change from baseline?: Pre-admission baseline Dressing (OT): Needs assistance Is this a change from baseline?: Pre-admission baseline Grooming: Needs assistance Is this a change from baseline?: Pre-admission baseline Feeding: Independent Bathing: Needs assistance Is this a change from baseline?: Pre-admission baseline Toileting: Needs assistance Is this a change from baseline?: Pre-admission baseline In/Out Bed: Needs assistance Is this a change from baseline?: Pre-admission baseline Walks in Home: Needs assistance Is this a change from baseline?: Pre-admission baseline Does the patient have  difficulty walking or climbing stairs?: Yes Weakness of Legs: Both Weakness of Arms/Hands: Both  Permission Sought/Granted                  Emotional Assessment              Admission diagnosis:  Paresthesia [R20.2] Cervical myelopathy ([place]) [G95.9] Upper extremity weakness [R29.898] Acute bilateral back pain, unspecified back location [M54.9] Patient Active Problem List   Diagnosis Date Noted   Stenosis of cervical spine with myelopathy ([place]) 03/24/2022   Muscle weakness of upper extremity 03/24/2022   Abnormal EKG 03/24/2022   Lower limb ulcer, calf, left, limited to breakdown of skin ([place]) 08/30/2021   Ulcer of left lower extremity, limited to breakdown of skin ([place]) 05/02/2021   Primary osteoarthritis of right knee 05/02/2021   Pain in joint of left knee 08/24/2020   Pain in joint of right knee 08/24/2020   Mild asthma 01/20/2019   Anemia 07/02/2018   Hyperlipidemia 07/02/2018   Obesity 07/02/2018   Backache 07/02/2018   Postoperative state 07/02/2018   Sleep apnea 07/02/2018   Spinal stenosis of lumbar region 07/02/2018   Lymphedema of both lower extremities 05/23/2017   Polyarthralgia 05/23/2017   Left ankle swelling 05/21/2017   Neuropathy 05/21/2017   Abnormal gait 05/15/2017   Ambulatory dysfunction 05/15/2017   Weakness 05/15/2017   Iliac artery aneurysm ([place]) 09/06/2016   Essential hypertension, benign 09/06/2016  Swelling of limb 09/06/2016   Dizziness 07/25/2016   Lumbar spondylosis 06/09/2016   Incomplete tear of left rotator cuff 10/15/2015   Aortic ejection murmur 01/08/2015   Bilateral edema of lower extremity 01/08/2015   Impingement syndrome of both shoulders 01/01/2015   Rotator cuff tendinitis, left 01/01/2015   Primary osteoarthritis of left knee 11/16/2014   Increased frequency of urination 06/06/2014   Starting and stopping of urinary stream during micturition 06/06/2014   Bright red rectal bleeding 06/04/2014   Constipation due  to pain medication 06/04/2014   Balanitis 04/16/2014   Phimosis 04/16/2014   Redundant prepuce and phimosis 04/16/2014   Acute cystitis 09/16/2012   Benign prostatic hyperplasia with urinary obstruction 09/16/2012   Bladder neoplasm of uncertain malignant potential 09/16/2012   Candidiasis of urogenital sites 09/16/2012   Incomplete emptying of bladder 09/16/2012   Other long term (current) drug therapy 09/16/2012   ED (erectile dysfunction) of organic origin 09/16/2012   Hypogonadism in male 09/16/2012   AAA (abdominal aortic aneurysm) ([place]) 09/04/2012   PCP:  Baxter Hire, MD Pharmacy:   [id number] [id number]: [phone]: [phone]    Social Determinants of Health (SDOH) Interventions    Readmission Risk Interventions     No data to display

## 2022-03-26 NOTE — Consult Note (Signed)
CARDIOLOGY CONSULT NOTE               Patient ID: [name] Bean MRN: [id number] DOB/AGE: December 02, 1934 87 y.o.  Admit date: 03/23/2022 Referring Physician Florina Ou hospitalist Primary Physician Harrel Lemon primary Primary Cardiologist \ Reason for Consultation clearance spine surgery  HPI: Patient is a 87 year old male originally from [place] moved to [place] and then [place] complaining of significant neck and back pain was evaluated and found to have spinal stenosis symptomatically had generalized weakness and fatigue of his extremities especially lower extremities patient is now preop for surgery.  Denies any significant cardiac history has had some mild shortness of breath poss related to COPD no chest pain he has chronic lower extremity edema suggestive of lymphedema otherwise reasonably stable  Review of systems complete and found to be negative unless listed above     Past Medical History:  Diagnosis Date   Arthritis    left knee;    Hyperlipidemia    Urethral stricture     History reviewed. No pertinent surgical history.  Medications Prior to Admission  Medication Sig Dispense Refill Last Dose   albuterol (PROVENTIL HFA;VENTOLIN HFA) 108 (90 Base) MCG/ACT inhaler Inhale into the lungs.   Past Week   aspirin EC 81 MG tablet Take 81 mg by mouth.   03/23/2022   Cholecalciferol (VITAMIN D3) 1000 units CAPS Take 1,000 Units by mouth daily.   03/23/2022   docusate sodium (COLACE) 100 MG capsule Take by mouth.   PRN   fluticasone (FLONASE) 50 MCG/ACT nasal spray SPRAY 2 SPRAYS INTO EACH NOSTRIL EVERY DAY  2 03/23/2022   loratadine (CLARITIN) 10 MG tablet Take 10 mg by mouth daily as needed.  11 03/23/2022   meloxicam (MOBIC) 15 MG tablet Take 15 mg by mouth daily.   03/23/2022   omeprazole (PRILOSEC) 40 MG capsule Take 40 mg by mouth daily.   03/23/2022   oxyCODONE-acetaminophen (PERCOCET) 10-325 MG tablet Take 1 tablet by mouth 4 (four) times daily as needed for  pain. Pt states he take (1/2) tablet   03/23/2022   potassium chloride SA (KLOR-CON) 20 MEQ tablet TAKE 1 TABLET (20 MEQ TOTAL) BY MOUTH 3 (THREE) TIMES DAILY.   03/23/2022   tamsulosin (FLOMAX) 0.4 MG CAPS capsule Take 0.4 mg by mouth daily.   03/23/2022   Testosterone 10 MG/ACT (2%) GEL Apply 20 mg topically daily.   Past Week   torsemide (DEMADEX) 20 MG tablet Take 1 tablet by mouth daily.   03/23/2022   augmented betamethasone dipropionate (DIPROLENE-AF) 0.05 % ointment APPLY TO AFFECTED AREAS TWICE DAILY UNTIL CLEAR  2    mupirocin ointment (BACTROBAN) 2 % APPLY TO AFFECTED AREA 3 TIMES A DAY FOR 7 DAYS      triamcinolone (KENALOG) 0.025 % cream triamcinolone acetonide 0.025 % topical cream      Social History   Socioeconomic History   Marital status: Single    Spouse name: Not on file   Number of children: Not on file   Years of education: Not on file   Highest education level: Not on file  Occupational History   Not on file  Tobacco Use   Smoking status: Former    Types: Cigarettes    Quit date: 1982    Years since quitting: 41.5   Smokeless tobacco: Never  Vaping Use   Vaping Use: Never used  Substance and Sexual Activity   Alcohol use: No   Drug use: No   Sexual  activity: Yes  Other Topics Concern   Not on file  Social History Narrative   Not on file   Social Determinants of Health   Financial Resource Strain: Not on file  Food Insecurity: Not on file  Transportation Needs: Not on file  Physical Activity: Not on file  Stress: Not on file  Social Connections: Not on file  Intimate Partner Violence: Not on file    Family History  Problem Relation Age of Onset   Diabetes Brother       Review of systems complete and found to be negative unless listed above      PHYSICAL EXAM  General: Well developed, well nourished, in no acute distress HEENT:  Normocephalic and atramatic Neck:  No JVD.  Lungs: Clear bilaterally to auscultation and percussion. Heart:  HRRR . Normal S1 and S2 without gallops or murmurs.  Abdomen: Bowel sounds are positive, abdomen soft and non-tender  Msk:  Back normal, normal gait. Normal strength and tone for age. Extremities: No clubbing, cyanosis or 2+edema.   Neuro: Alert and oriented X 3. Psych:  Good affect, responds appropriately  Labs:   Lab Results  Component Value Date   WBC 5.6 03/24/2022   HGB 11.4 (L) 03/24/2022   HCT 35.6 (L) 03/24/2022   MCV 88.1 03/24/2022   PLT 204 03/24/2022    Recent Labs  Lab 03/24/22 0500 03/25/22 0951 03/26/22 0910  NA 140 138  --   K 4.0 3.6 3.7  CL 108 105  --   CO2 28 26  --   BUN 18 24*  --   CREATININE 0.87 0.98  --   CALCIUM 9.7 9.5  --   PROT 6.9  --   --   BILITOT 0.8  --   --   ALKPHOS 106  --   --   ALT 24  --   --   AST 27  --   --   GLUCOSE 108* 110*  --    Lab Results  Component Value Date   CKTOTAL 70 03/23/2022   No results found for: "CHOL" No results found for: "HDL" No results found for: "LDLCALC" No results found for: "TRIG" No results found for: "CHOLHDL" No results found for: "LDLDIRECT"    Radiology: US Venous Img Lower Bilateral (DVT)  Result Date: 03/24/2022 CLINICAL DATA:  Bilateral lower extremity swelling. EXAM: BILATERAL LOWER EXTREMITY VENOUS DOPPLER ULTRASOUND TECHNIQUE: Gray-scale sonography with graded compression, as well as color Doppler and duplex ultrasound were performed to evaluate the lower extremity deep venous systems from the level of the common femoral vein and including the common femoral, femoral, profunda femoral, popliteal and calf veins including the posterior tibial, peroneal and gastrocnemius veins when visible. The superficial great saphenous vein was also interrogated. Spectral Doppler was utilized to evaluate flow at rest and with distal augmentation maneuvers in the common femoral, femoral and popliteal veins. COMPARISON:  None Available. FINDINGS: RIGHT LOWER EXTREMITY Common Femoral Vein: No evidence of  thrombus. Normal compressibility, respiratory phasicity and response to augmentation. Saphenofemoral Junction: No evidence of thrombus. Normal compressibility and flow on color Doppler imaging. Profunda Femoral Vein: No evidence of thrombus. Normal compressibility and flow on color Doppler imaging. Femoral Vein: No evidence of thrombus. Normal compressibility, respiratory phasicity and response to augmentation. Popliteal Vein: No evidence of thrombus. Normal compressibility, respiratory phasicity and response to augmentation. Calf Veins: Limited visualization due to edema. No evidence of thrombus. Normal compressibility and flow on color Doppler imaging. LEFT LOWER  EXTREMITY Common Femoral Vein: No evidence of thrombus. Normal compressibility, respiratory phasicity and response to augmentation. Saphenofemoral Junction: No evidence of thrombus. Normal compressibility and flow on color Doppler imaging. Profunda Femoral Vein: No evidence of thrombus. Normal compressibility and flow on color Doppler imaging. Femoral Vein: No evidence of thrombus. Normal compressibility, respiratory phasicity and response to augmentation. Popliteal Vein: No evidence of thrombus. Normal compressibility, respiratory phasicity and response to augmentation. Calf Veins: Limited visualization due to edema no evidence of thrombus. Normal compressibility and flow on color Doppler imaging. IMPRESSION: No evidence of deep venous thrombosis in either lower extremity with limited visualization of the calf veins due to edema. Electronically Signed   By: Margaretha Sheffield M.D.   On: 03/24/2022 14:46   MR Cervical Spine W or Wo Contrast  Addendum Date: 03/23/2022   ADDENDUM REPORT: 03/23/2022 23:54 ADDENDUM: Critical Value/emergent results were called by telephone at the time of interpretation on 03/23/2022 at 11:54 pm to provider [facility] , who verbally acknowledged these results. Electronically Signed   By: Ulyses Jarred M.D.   On: 03/23/2022  23:54   Result Date: 03/23/2022 CLINICAL DATA:  Neck and low back pain EXAM: MRI CERVICAL, THORACIC AND LUMBAR SPINE WITHOUT AND WITH CONTRAST TECHNIQUE: Multiplanar and multiecho pulse sequences of the cervical spine, to include the craniocervical junction and cervicothoracic junction, and thoracic and lumbar spine, were obtained without and with intravenous contrast. CONTRAST:  18m GADAVIST GADOBUTROL 1 MMOL/ML IV SOLN COMPARISON:  None Available. FINDINGS: MRI CERVICAL SPINE FINDINGS Alignment: Physiologic. Vertebrae: C4-5 ACDF Cord: Hyperintense T2-weighted signal within the spinal cord at the C3-4 level. Posterior Fossa, vertebral arteries, paraspinal tissues: Negative. Disc levels: C1-2: Unremarkable. C2-3: Normal disc space and facet joints. There is no spinal canal stenosis. No neural foraminal stenosis. C3-4: Ligamentum flavum redundancy and small disc bulge. Severe spinal canal stenosis. Impingement of the spinal cord with signal change. Mild right and severe left neural foraminal stenosis. C4-5: Solid anterior arthrodesis. There is no spinal canal stenosis. No neural foraminal stenosis. C5-6: Small disc bulge with endplate spurring. There is no spinal canal stenosis. Moderate bilateral neural foraminal stenosis. C6-7: Disc space narrowing. There is no spinal canal stenosis. Mild left neural foraminal stenosis. C7-T1: Normal disc space and facet joints. There is no spinal canal stenosis. No neural foraminal stenosis. MRI THORACIC SPINE FINDINGS Alignment:  Physiologic. Vertebrae: No fracture, evidence of discitis, or bone lesion. Cord:  Normal signal and morphology. Paraspinal and other soft tissues: Negative. Disc levels: No spinal canal or neural foraminal stenosis. MRI LUMBAR SPINE FINDINGS Segmentation:  Standard. Alignment:  Physiologic. Vertebrae:  No fracture, evidence of discitis, or bone lesion. Conus medullaris and cauda equina: Conus extends to the L2 level. Conus and cauda equina appear  normal. Paraspinal and other soft tissues: 3.5 cm abdominal aortic aneurysm. Disc levels: L1-L2: Mild disc bulge with endplate spurring. No spinal canal stenosis. No neural foraminal stenosis. L2-L3: Small disc bulge with endplate spurring. No spinal canal stenosis. Mild left neural foraminal stenosis. L3-L4: Small disc bulge with endplate spurring. No spinal canal stenosis. No neural foraminal stenosis. L4-L5: Small left asymmetric disc bulge and mild facet hypertrophy. Left lateral recess narrowing without central spinal canal stenosis. Mild right neural foraminal stenosis. L5-S1: Disc space narrowing with small bulge. No spinal canal stenosis. Mild left neural foraminal stenosis. Visualized sacrum: Normal. IMPRESSION: 1. Severe C3-4 spinal canal stenosis with compression of the spinal cord and signal change consistent with compressive myelopathy. 2. Moderate bilateral neural foraminal stenosis  at C5-6. 3. No thoracic or lumbar spinal canal stenosis. 4. Mild right neural foraminal stenosis at L4-5 and L5-S1. 5. 3.5 cm abdominal aortic aneurysm. Recommend follow-up every 2 years. Reference: J Am Coll Radiol 1610;[id number]. Electronically Signed: By: Ulyses Jarred M.D. On: 03/23/2022 23:42   MR THORACIC SPINE W WO CONTRAST  Addendum Date: 03/23/2022   ADDENDUM REPORT: 03/23/2022 23:54 ADDENDUM: Critical Value/emergent results were called by telephone at the time of interpretation on 03/23/2022 at 11:54 pm to provider [facility] , who verbally acknowledged these results. Electronically Signed   By: Ulyses Jarred M.D.   On: 03/23/2022 23:54   Result Date: 03/23/2022 CLINICAL DATA:  Neck and low back pain EXAM: MRI CERVICAL, THORACIC AND LUMBAR SPINE WITHOUT AND WITH CONTRAST TECHNIQUE: Multiplanar and multiecho pulse sequences of the cervical spine, to include the craniocervical junction and cervicothoracic junction, and thoracic and lumbar spine, were obtained without and with intravenous contrast. CONTRAST:   22m GADAVIST GADOBUTROL 1 MMOL/ML IV SOLN COMPARISON:  None Available. FINDINGS: MRI CERVICAL SPINE FINDINGS Alignment: Physiologic. Vertebrae: C4-5 ACDF Cord: Hyperintense T2-weighted signal within the spinal cord at the C3-4 level. Posterior Fossa, vertebral arteries, paraspinal tissues: Negative. Disc levels: C1-2: Unremarkable. C2-3: Normal disc space and facet joints. There is no spinal canal stenosis. No neural foraminal stenosis. C3-4: Ligamentum flavum redundancy and small disc bulge. Severe spinal canal stenosis. Impingement of the spinal cord with signal change. Mild right and severe left neural foraminal stenosis. C4-5: Solid anterior arthrodesis. There is no spinal canal stenosis. No neural foraminal stenosis. C5-6: Small disc bulge with endplate spurring. There is no spinal canal stenosis. Moderate bilateral neural foraminal stenosis. C6-7: Disc space narrowing. There is no spinal canal stenosis. Mild left neural foraminal stenosis. C7-T1: Normal disc space and facet joints. There is no spinal canal stenosis. No neural foraminal stenosis. MRI THORACIC SPINE FINDINGS Alignment:  Physiologic. Vertebrae: No fracture, evidence of discitis, or bone lesion. Cord:  Normal signal and morphology. Paraspinal and other soft tissues: Negative. Disc levels: No spinal canal or neural foraminal stenosis. MRI LUMBAR SPINE FINDINGS Segmentation:  Standard. Alignment:  Physiologic. Vertebrae:  No fracture, evidence of discitis, or bone lesion. Conus medullaris and cauda equina: Conus extends to the L2 level. Conus and cauda equina appear normal. Paraspinal and other soft tissues: 3.5 cm abdominal aortic aneurysm. Disc levels: L1-L2: Mild disc bulge with endplate spurring. No spinal canal stenosis. No neural foraminal stenosis. L2-L3: Small disc bulge with endplate spurring. No spinal canal stenosis. Mild left neural foraminal stenosis. L3-L4: Small disc bulge with endplate spurring. No spinal canal stenosis. No neural  foraminal stenosis. L4-L5: Small left asymmetric disc bulge and mild facet hypertrophy. Left lateral recess narrowing without central spinal canal stenosis. Mild right neural foraminal stenosis. L5-S1: Disc space narrowing with small bulge. No spinal canal stenosis. Mild left neural foraminal stenosis. Visualized sacrum: Normal. IMPRESSION: 1. Severe C3-4 spinal canal stenosis with compression of the spinal cord and signal change consistent with compressive myelopathy. 2. Moderate bilateral neural foraminal stenosis at C5-6. 3. No thoracic or lumbar spinal canal stenosis. 4. Mild right neural foraminal stenosis at L4-5 and L5-S1. 5. 3.5 cm abdominal aortic aneurysm. Recommend follow-up every 2 years. Reference: J Am Coll Radiol [id number] Electronically Signed: By: KUlyses JarredM.D. On: 03/23/2022 23:42   MR Lumbar Spine W Wo Contrast  Addendum Date: 03/23/2022   ADDENDUM REPORT: 03/23/2022 23:54 ADDENDUM: Critical Value/emergent results were called by telephone at the time of interpretation on 03/23/2022 at  11:54 pm to provider Hulan Saas , who verbally acknowledged these results. Electronically Signed   By: Ulyses Jarred M.D.   On: 03/23/2022 23:54   Result Date: 03/23/2022 CLINICAL DATA:  Neck and low back pain EXAM: MRI CERVICAL, THORACIC AND LUMBAR SPINE WITHOUT AND WITH CONTRAST TECHNIQUE: Multiplanar and multiecho pulse sequences of the cervical spine, to include the craniocervical junction and cervicothoracic junction, and thoracic and lumbar spine, were obtained without and with intravenous contrast. CONTRAST:  76m GADAVIST GADOBUTROL 1 MMOL/ML IV SOLN COMPARISON:  None Available. FINDINGS: MRI CERVICAL SPINE FINDINGS Alignment: Physiologic. Vertebrae: C4-5 ACDF Cord: Hyperintense T2-weighted signal within the spinal cord at the C3-4 level. Posterior Fossa, vertebral arteries, paraspinal tissues: Negative. Disc levels: C1-2: Unremarkable. C2-3: Normal disc space and facet joints. There is no  spinal canal stenosis. No neural foraminal stenosis. C3-4: Ligamentum flavum redundancy and small disc bulge. Severe spinal canal stenosis. Impingement of the spinal cord with signal change. Mild right and severe left neural foraminal stenosis. C4-5: Solid anterior arthrodesis. There is no spinal canal stenosis. No neural foraminal stenosis. C5-6: Small disc bulge with endplate spurring. There is no spinal canal stenosis. Moderate bilateral neural foraminal stenosis. C6-7: Disc space narrowing. There is no spinal canal stenosis. Mild left neural foraminal stenosis. C7-T1: Normal disc space and facet joints. There is no spinal canal stenosis. No neural foraminal stenosis. MRI THORACIC SPINE FINDINGS Alignment:  Physiologic. Vertebrae: No fracture, evidence of discitis, or bone lesion. Cord:  Normal signal and morphology. Paraspinal and other soft tissues: Negative. Disc levels: No spinal canal or neural foraminal stenosis. MRI LUMBAR SPINE FINDINGS Segmentation:  Standard. Alignment:  Physiologic. Vertebrae:  No fracture, evidence of discitis, or bone lesion. Conus medullaris and cauda equina: Conus extends to the L2 level. Conus and cauda equina appear normal. Paraspinal and other soft tissues: 3.5 cm abdominal aortic aneurysm. Disc levels: L1-L2: Mild disc bulge with endplate spurring. No spinal canal stenosis. No neural foraminal stenosis. L2-L3: Small disc bulge with endplate spurring. No spinal canal stenosis. Mild left neural foraminal stenosis. L3-L4: Small disc bulge with endplate spurring. No spinal canal stenosis. No neural foraminal stenosis. L4-L5: Small left asymmetric disc bulge and mild facet hypertrophy. Left lateral recess narrowing without central spinal canal stenosis. Mild right neural foraminal stenosis. L5-S1: Disc space narrowing with small bulge. No spinal canal stenosis. Mild left neural foraminal stenosis. Visualized sacrum: Normal. IMPRESSION: 1. Severe C3-4 spinal canal stenosis with  compression of the spinal cord and signal change consistent with compressive myelopathy. 2. Moderate bilateral neural foraminal stenosis at C5-6. 3. No thoracic or lumbar spinal canal stenosis. 4. Mild right neural foraminal stenosis at L4-5 and L5-S1. 5. 3.5 cm abdominal aortic aneurysm. Recommend follow-up every 2 years. Reference: J Am Coll Radiol [id number] Electronically Signed: By: KUlyses JarredM.D. On: 03/23/2022 23:42    EKG: Normal sinus rhythm nonspecific ST-T wave changes  ASSESSMENT AND PLAN:  Preop neurosurgery spine surgery Obesity Shortness of breath COPD DJD GERD Lymphedema Lower extremity weakness Hypertension Abdominal aortic aneurysm Obstructive sleep apnea  Plan Patient appears to be an acceptable mild to moderate risk for spine surgery No further testing or intervention necessary to modify risk CPAP weight loss for obstructive sleep apnea management Continue omeprazole therapy for reflux type symptoms Low-dose diuretic therapy for lymphedema recommend support stockings and elevation Mild lower GI bleed recommend avoiding NSAIDs Have the patient follow-up with cardiology as an outpatient     Signed: DYolonda KidaMD, 03/26/2022, 3:30 PM

## 2022-03-26 NOTE — Progress Notes (Signed)
Per Sobi RRT, patient refused CPAP tonight.

## 2022-03-26 NOTE — Plan of Care (Signed)

## 2022-03-27 ENCOUNTER — Encounter
Admission: EM | Disposition: A | Discharge status: Skilled Nursing Facility | Payer: Self-pay | Source: Home / Self Care | Attending: Internal Medicine

## 2022-03-27 ENCOUNTER — Inpatient Hospital Stay: Payer: Medicare Other

## 2022-03-27 ENCOUNTER — Inpatient Hospital Stay: Payer: Medicare Other | Admitting: Certified Registered"

## 2022-03-27 ENCOUNTER — Other Ambulatory Visit: Payer: Self-pay

## 2022-03-27 ENCOUNTER — Encounter: Payer: Self-pay | Admitting: Internal Medicine

## 2022-03-27 DIAGNOSIS — M6281 Muscle weakness (generalized): Secondary | ICD-10-CM | POA: Diagnosis not present

## 2022-03-27 HISTORY — PX: POSTERIOR CERVICAL FUSION/FORAMINOTOMY: SHX5038

## 2022-03-27 LAB — RENAL FUNCTION PANEL
Albumin: 3.1 g/dL — ABNORMAL LOW (ref 3.5–5.0)
Anion gap: 8 (ref 5–15)
BUN: 23 mg/dL (ref 8–23)
CO2: 28 mmol/L (ref 22–32)
Calcium: 9.1 mg/dL (ref 8.9–10.3)
Chloride: 103 mmol/L (ref 98–111)
Creatinine, Ser: 1.04 mg/dL (ref 0.61–1.24)
GFR, Estimated: >60 mL/min (ref >60)
Glucose, Bld: 96 mg/dL (ref 70–99)
Phosphorus: 3.6 mg/dL (ref 2.5–4.6)
Potassium: 3.6 mmol/L (ref 3.5–5.1)
Sodium: 139 mmol/L (ref 135–145)

## 2022-03-27 LAB — ABO/RH: ABO/RH(D): A POS

## 2022-03-27 SURGERY — POSTERIOR CERVICAL FUSION/FORAMINOTOMY LEVEL 5
Anesthesia: General | Site: Spine Cervical

## 2022-03-27 MED ORDER — MIDAZOLAM HCL 2 MG/2ML IJ SOLN
INTRAMUSCULAR | Status: AC
  Filled 2022-03-27: qty 2

## 2022-03-27 MED ORDER — SODIUM CHLORIDE (PF) 0.9 % IJ SOLN
INTRAMUSCULAR | Status: DC | PRN
  Administered 2022-03-27: 60 mL via INTRAMUSCULAR

## 2022-03-27 MED ORDER — REMIFENTANIL HCL 1 MG IV SOLR
INTRAVENOUS | Status: DC | PRN
  Administered 2022-03-27: .15 ug/kg/min via INTRAVENOUS

## 2022-03-27 MED ORDER — BUPIVACAINE LIPOSOME 1.3 % IJ SUSP
INTRAMUSCULAR | Status: AC
  Filled 2022-03-27: qty 20

## 2022-03-27 MED ORDER — LIDOCAINE HCL (CARDIAC) PF 100 MG/5ML IV SOSY
PREFILLED_SYRINGE | INTRAVENOUS | Status: DC | PRN
  Administered 2022-03-27: 100 mg via INTRAVENOUS

## 2022-03-27 MED ORDER — ONDANSETRON HCL 4 MG/2ML IJ SOLN
INTRAMUSCULAR | Status: DC | PRN
  Administered 2022-03-27: 4 mg via INTRAVENOUS

## 2022-03-27 MED ORDER — BUPIVACAINE-EPINEPHRINE (PF) 0.5% -1:200000 IJ SOLN
INTRAMUSCULAR | Status: DC | PRN
  Administered 2022-03-27: 8 mL

## 2022-03-27 MED ORDER — CEFAZOLIN SODIUM-DEXTROSE 2-3 GM-%(50ML) IV SOLR
INTRAVENOUS | Status: DC | PRN
  Administered 2022-03-27: 2 g via INTRAVENOUS

## 2022-03-27 MED ORDER — BUPIVACAINE-EPINEPHRINE (PF) 0.5% -1:200000 IJ SOLN
INTRAMUSCULAR | Status: AC
Start: 2022-03-27 — End: ?
  Filled 2022-03-27: qty 30

## 2022-03-27 MED ORDER — SURGIRINSE WOUND IRRIGATION SYSTEM - OPTIME
TOPICAL | Status: DC | PRN
  Administered 2022-03-27: 450 mL via TOPICAL

## 2022-03-27 MED ORDER — SODIUM CHLORIDE 0.9 % IV SOLN: INTRAVENOUS | Status: DC | PRN

## 2022-03-27 MED ORDER — ALBUMIN HUMAN 5 % IV SOLN
INTRAVENOUS | Status: AC
  Filled 2022-03-27: qty 250

## 2022-03-27 MED ORDER — PHENYLEPHRINE 80 MCG/ML (10ML) SYRINGE FOR IV PUSH (FOR BLOOD PRESSURE SUPPORT)
PREFILLED_SYRINGE | INTRAVENOUS | Status: DC | PRN
  Administered 2022-03-27: 240 ug via INTRAVENOUS
  Administered 2022-03-27 (×5): 160 ug via INTRAVENOUS
  Administered 2022-03-27: 240 ug via INTRAVENOUS
  Administered 2022-03-27: 160 ug via INTRAVENOUS

## 2022-03-27 MED ORDER — PROPOFOL 500 MG/50ML IV EMUL
INTRAVENOUS | Status: DC | PRN
  Administered 2022-03-27: 100 ug/kg/min via INTRAVENOUS

## 2022-03-27 MED ORDER — EPHEDRINE SULFATE (PRESSORS) 50 MG/ML IJ SOLN
INTRAMUSCULAR | Status: DC | PRN
  Administered 2022-03-27 (×2): 10 mg via INTRAVENOUS
  Administered 2022-03-27 (×2): 5 mg via INTRAVENOUS

## 2022-03-27 MED ORDER — ALBUMIN HUMAN 5 % IV SOLN: INTRAVENOUS | Status: DC | PRN

## 2022-03-27 MED ORDER — OXYCODONE HCL 5 MG PO TABS
ORAL_TABLET | ORAL | Status: AC
  Filled 2022-03-27: qty 1

## 2022-03-27 MED ORDER — PROPOFOL 1000 MG/100ML IV EMUL
INTRAVENOUS | Status: AC
  Filled 2022-03-27: qty 100

## 2022-03-27 MED ORDER — SODIUM CHLORIDE FLUSH 0.9 % IV SOLN
INTRAVENOUS | Status: AC
  Filled 2022-03-27: qty 20

## 2022-03-27 MED ORDER — 0.9 % SODIUM CHLORIDE (POUR BTL) OPTIME
TOPICAL | Status: DC | PRN
  Administered 2022-03-27: 500 mL

## 2022-03-27 MED ORDER — PHENYLEPHRINE HCL-NACL 20-0.9 MG/250ML-% IV SOLN
INTRAVENOUS | Status: DC | PRN
  Administered 2022-03-27: 25 ug/min via INTRAVENOUS

## 2022-03-27 MED ORDER — SUCCINYLCHOLINE CHLORIDE 200 MG/10ML IV SOSY
PREFILLED_SYRINGE | INTRAVENOUS | Status: DC | PRN
  Administered 2022-03-27: 100 mg via INTRAVENOUS

## 2022-03-27 MED ORDER — REMIFENTANIL HCL 1 MG IV SOLR
INTRAVENOUS | Status: AC
  Filled 2022-03-27: qty 1000

## 2022-03-27 MED ORDER — FENTANYL CITRATE (PF) 100 MCG/2ML IJ SOLN
INTRAMUSCULAR | Status: DC | PRN
  Administered 2022-03-27 (×2): 50 ug via INTRAVENOUS

## 2022-03-27 MED ORDER — EPINEPHRINE 1 MG/10ML IJ SOSY
PREFILLED_SYRINGE | INTRAMUSCULAR | Status: DC | PRN
  Administered 2022-03-27 (×2): 10 ug via INTRAVENOUS

## 2022-03-27 MED ORDER — FENTANYL CITRATE (PF) 100 MCG/2ML IJ SOLN: 25.0000 ug | INTRAMUSCULAR | Status: DC | PRN

## 2022-03-27 MED ORDER — LACTATED RINGERS IV SOLN: INTRAVENOUS | Status: DC

## 2022-03-27 MED ORDER — OXYCODONE HCL 5 MG/5ML PO SOLN: 5.0000 mg | Freq: Once | ORAL | Status: AC | PRN

## 2022-03-27 MED ORDER — GLYCOPYRROLATE 0.2 MG/ML IJ SOLN
INTRAMUSCULAR | Status: DC | PRN
  Administered 2022-03-27: .2 mg via INTRAVENOUS

## 2022-03-27 MED ORDER — VASOPRESSIN 20 UNIT/ML IV SOLN
INTRAVENOUS | Status: DC | PRN
  Administered 2022-03-27 (×2): 2 [IU] via INTRAVENOUS

## 2022-03-27 MED ORDER — SURGIFLO WITH THROMBIN (HEMOSTATIC MATRIX KIT) OPTIME
TOPICAL | Status: DC | PRN
  Administered 2022-03-27: 1 via TOPICAL

## 2022-03-27 MED ORDER — DEXAMETHASONE SODIUM PHOSPHATE 10 MG/ML IJ SOLN
INTRAMUSCULAR | Status: DC | PRN
  Administered 2022-03-27: 10 mg via INTRAVENOUS

## 2022-03-27 MED ORDER — ACETAMINOPHEN 10 MG/ML IV SOLN: 1000.0000 mg | Freq: Once | INTRAVENOUS | Status: DC | PRN

## 2022-03-27 MED ORDER — OXYCODONE HCL 5 MG PO TABS
5.0000 mg | ORAL_TABLET | Freq: Once | ORAL | Status: AC | PRN
  Administered 2022-03-27: 5 mg via ORAL

## 2022-03-27 MED ORDER — PROPOFOL 10 MG/ML IV BOLUS
INTRAVENOUS | Status: DC | PRN
  Administered 2022-03-27 (×2): 50 mg via INTRAVENOUS
  Administered 2022-03-27: 100 mg via INTRAVENOUS

## 2022-03-27 MED ORDER — FENTANYL CITRATE (PF) 100 MCG/2ML IJ SOLN
INTRAMUSCULAR | Status: AC
  Filled 2022-03-27: qty 2

## 2022-03-27 MED ORDER — PROPOFOL 10 MG/ML IV BOLUS
INTRAVENOUS | Status: AC
  Filled 2022-03-27: qty 20

## 2022-03-27 SURGICAL SUPPLY — 72 items
ALLOGRAFT BONE FIBER KORE 5 (Bone Implant) ×1 IMPLANT
BASIN KIT SINGLE STR (MISCELLANEOUS) ×2 IMPLANT
BIT DRILLSCRW 3.5 (BIT) ×1 IMPLANT
BLADE SURG 15 STRL LF DISP TIS (BLADE) ×1 IMPLANT
BLADE SURG 15 STRL SS (BLADE) ×1
BULB RESERV EVAC DRAIN JP 100C (MISCELLANEOUS) IMPLANT
BUR NEURO DRILL SOFT 3.0X3.8M (BURR) ×2 IMPLANT
CAP LOCKING (Cap) IMPLANT
CHLORAPREP W/TINT 26 (MISCELLANEOUS) ×2 IMPLANT
COUNTER NEEDLE 20/40 LG (NEEDLE) ×2 IMPLANT
CUP MEDICINE 2OZ PLAST GRAD ST (MISCELLANEOUS) ×2 IMPLANT
DERMABOND ADVANCED (GAUZE/BANDAGES/DRESSINGS) ×1
DERMABOND ADVANCED .7 DNX12 (GAUZE/BANDAGES/DRESSINGS) ×1 IMPLANT
DRAIN CHANNEL JP 10F RND 20C F (MISCELLANEOUS) IMPLANT
DRAPE C ARM PK CFD 31 SPINE (DRAPES) ×2 IMPLANT
DRAPE LAPAROTOMY 100X77 ABD (DRAPES) ×2 IMPLANT
DRAPE MICROSCOPE SPINE 48X150 (DRAPES) IMPLANT
DRAPE SURG 17X11 SM STRL (DRAPES) ×2 IMPLANT
DRSG OPSITE POSTOP 4X6 (GAUZE/BANDAGES/DRESSINGS) ×1 IMPLANT
ELECT CAUTERY BLADE TIP 2.5 (TIP) ×2
ELECTRODE CAUTERY BLDE TIP 2.5 (TIP) ×1 IMPLANT
FEE INTRAOP CADWELL SUPPLY NCS (MISCELLANEOUS) IMPLANT
FEE INTRAOP MONITOR IMPULS NCS (MISCELLANEOUS) IMPLANT
GAUZE SPONGE 4X4 12PLY STRL (GAUZE/BANDAGES/DRESSINGS) ×2 IMPLANT
GAUZE XEROFORM 1X8 LF (GAUZE/BANDAGES/DRESSINGS) ×1 IMPLANT
GLOVE BIOGEL PI IND STRL 6.5 (GLOVE) ×1 IMPLANT
GLOVE BIOGEL PI IND STRL 8.5 (GLOVE) ×1 IMPLANT
GLOVE BIOGEL PI INDICATOR 6.5 (GLOVE) ×1
GLOVE BIOGEL PI INDICATOR 8.5 (GLOVE) ×1
GLOVE SURG SYN 6.5 ES PF (GLOVE) ×2 IMPLANT
GLOVE SURG SYN 6.5 PF PI (GLOVE) ×1 IMPLANT
GLOVE SURG SYN 8.5  E (GLOVE) ×3
GLOVE SURG SYN 8.5 E (GLOVE) ×3 IMPLANT
GLOVE SURG SYN 8.5 PF PI (GLOVE) ×3 IMPLANT
GOWN SRG LRG LVL 4 IMPRV REINF (GOWNS) ×1 IMPLANT
GOWN SRG XL LVL 3 NONREINFORCE (GOWNS) ×1 IMPLANT
GOWN STRL NON-REIN TWL XL LVL3 (GOWNS) ×1
GOWN STRL REIN LRG LVL4 (GOWNS) ×1
GRADUATE 1200CC STRL 31836 (MISCELLANEOUS) ×1 IMPLANT
HEMOVAC 400CC 10FR (MISCELLANEOUS) ×1 IMPLANT
HOLDER FOLEY CATH W/STRAP (MISCELLANEOUS) ×1 IMPLANT
IMPL QUARTEX 3.5X16MM (Neuro Prosthesis/Implant) IMPLANT
IMPLANT QUARTEX 3.5X16MM (Neuro Prosthesis/Implant) ×8 IMPLANT
INTRAOP CADWELL SUPPLY FEE NCS (MISCELLANEOUS) ×1
INTRAOP DISP SUPPLY FEE NCS (MISCELLANEOUS) ×1
INTRAOP MONITOR FEE IMPULS NCS (MISCELLANEOUS) ×1
INTRAOP MONITOR FEE IMPULSE (MISCELLANEOUS) ×1
KIT TURNOVER KIT A (KITS) ×2 IMPLANT
LOCKING CAP (Cap) ×8 IMPLANT
MANIFOLD NEPTUNE II (INSTRUMENTS) ×2 IMPLANT
MARKER SKIN DUAL TIP RULER LAB (MISCELLANEOUS) ×3 IMPLANT
NDL SPNL 18GX3.5 QUINCKE PK (NEEDLE) ×3 IMPLANT
NEEDLE SPNL 18GX3.5 QUINCKE PK (NEEDLE) ×4 IMPLANT
NS IRRIG 1000ML POUR BTL (IV SOLUTION) ×1 IMPLANT
PACK LAMINECTOMY NEURO (CUSTOM PROCEDURE TRAY) ×2 IMPLANT
PAD ARMBOARD 7.5X6 YLW CONV (MISCELLANEOUS) ×4 IMPLANT
PIN MAYFIELD SKULL DISP (PIN) ×2 IMPLANT
ROD SPINAL 3.5D 25L CVD (Rod) ×2 IMPLANT
SOLUTION IRRIG SURGIPHOR (IV SOLUTION) ×2 IMPLANT
STAPLER SKIN PROX 35W (STAPLE) ×3 IMPLANT
SURGIFLO W/THROMBIN 8M KIT (HEMOSTASIS) ×3 IMPLANT
SUT ETHILON 3-0 FS-10 30 BLK (SUTURE) ×2
SUT V-LOC 90 ABS DVC 3-0 CL (SUTURE) ×2 IMPLANT
SUT VIC AB 0 CT1 27 (SUTURE) ×1
SUT VIC AB 0 CT1 27XCR 8 STRN (SUTURE) ×3 IMPLANT
SUT VIC AB 2-0 CT1 18 (SUTURE) ×4 IMPLANT
SUTURE EHLN 3-0 FS-10 30 BLK (SUTURE) IMPLANT
SYR 30ML LL (SYRINGE) ×6 IMPLANT
TAPE CLOTH 3X10 WHT NS LF (GAUZE/BANDAGES/DRESSINGS) ×4 IMPLANT
TOWEL OR 17X26 4PK STRL BLUE (TOWEL DISPOSABLE) ×5 IMPLANT
TRAY FOLEY MTR SLVR 16FR STAT (SET/KITS/TRAYS/PACK) IMPLANT
TUBING CONNECTING 10 (TUBING) ×1 IMPLANT

## 2022-03-27 NOTE — Plan of Care (Signed)
  Problem: Pain Managment: Goal: General experience of comfort will improve Outcome: Not Progressing   Problem: Safety: Goal: Ability to remain free from injury will improve Outcome: Not Progressing   Problem: Coping: Goal: Level of anxiety will decrease Outcome: Not Progressing

## 2022-03-27 NOTE — Anesthesia Postprocedure Evaluation (Signed)
Anesthesia Post Note  Patient: [name] Bean  Procedure(s) Performed: C3-4 POSTERIOR CERVICAL FUSION WITH DECOMPRESSION (Spine Cervical)  Patient location during evaluation: PACU Anesthesia Type: General Level of consciousness: awake and alert Pain management: pain level controlled Vital Signs Assessment: post-procedure vital signs reviewed and stable Respiratory status: spontaneous breathing, nonlabored ventilation, respiratory function stable and patient connected to nasal cannula oxygen Cardiovascular status: blood pressure returned to baseline and stable Postop Assessment: no apparent nausea or vomiting Anesthetic complications: no   No notable events documented.   Last Vitals:  Vitals:   03/27/22 1710 03/27/22 1715  BP: (!) 99/56 (!) 99/58  Pulse: 83 80  Resp: 14 15  Temp:    SpO2: 95% 95%    Last Pain:  Vitals:   03/27/22 1705  TempSrc:   PainSc: Asleep                 Martha Clan

## 2022-03-27 NOTE — Anesthesia Procedure Notes (Signed)
Procedure Name: Intubation Date/Time: 03/27/2022 1:47 PM  Performed by: Kelton Pillar, CRNAPre-anesthesia Checklist: Patient identified, Emergency Drugs available, Suction available and Patient being monitored Patient Re-evaluated:Patient Re-evaluated prior to induction Oxygen Delivery Method: Circle system utilized Preoxygenation: Pre-oxygenation with 100% oxygen Induction Type: IV induction Ventilation: Mask ventilation without difficulty Laryngoscope Size: McGraph and 3 Grade View: Grade I Tube type: Oral Tube size: 7.0 mm Number of attempts: 1 Airway Equipment and Method: Stylet and Oral airway Placement Confirmation: ETT inserted through vocal cords under direct vision, positive ETCO2, breath sounds checked- equal and bilateral and CO2 detector Secured at: 21 cm Tube secured with: Tape Dental Injury: Teeth and Oropharynx as per pre-operative assessment

## 2022-03-27 NOTE — Plan of Care (Signed)

## 2022-03-27 NOTE — Transfer of Care (Signed)
Immediate Anesthesia Transfer of Care Note  Patient: [name] Bean  Procedure(s) Performed: C3-4 POSTERIOR CERVICAL FUSION WITH DECOMPRESSION (Spine Cervical)  Patient Location: PACU  Anesthesia Type:General  Level of Consciousness: awake, alert  and oriented  Airway & Oxygen Therapy: Patient Spontanous Breathing  Post-op Assessment: Report given to RN and Post -op Vital signs reviewed and stable  Post vital signs: Reviewed and stable  Last Vitals:  Vitals Value Taken Time  BP 112/61 03/27/22 1606  Temp    Pulse 89 03/27/22 1609  Resp 16 03/27/22 1609  SpO2 100 % 03/27/22 1609  Vitals shown include unvalidated device data.  Last Pain:  Vitals:   03/27/22 1213  TempSrc: Temporal  PainSc: 10-Worst pain ever      Patients Stated Pain Goal: 0 (77/03/40 3524)  Complications: No notable events documented.

## 2022-03-27 NOTE — Anesthesia Preprocedure Evaluation (Addendum)
Anesthesia Evaluation  Patient identified by MRN, date of birth, ID band Patient awake    Reviewed: Allergy & Precautions, NPO status , Patient's Chart, lab work & pertinent test results  Airway Mallampati: III  TM Distance: >3 FB Neck ROM: full    Dental   Pulmonary shortness of breath, asthma , sleep apnea , former smoker,    Pulmonary exam normal        Cardiovascular Exercise Tolerance: Poor hypertension, Normal cardiovascular exam+ dysrhythmias   EKG 03/2022 Normal sinus rhythm Left bundle branch block Abnormal ECG  10/22: INTERPRETATION  NORMAL LEFT VENTRICULAR SYSTOLIC FUNCTION  NORMAL RIGHT VENTRICULAR SYSTOLIC FUNCTION  MILD VALVULAR REGURGITATION (See above)  MILD VALVULAR STENOSIS (See above)    Iliac artery aneurysm AAA Saw vascular in 5/23. Stable, check in one year   Lymphedema bilateral lower extremity   Neuro/Psych cervical myelopathy, cervical stenosis  Pt has had previous cervical laminectomy.  Patient at baseline ambulates with a cane  Neuromuscular disease negative psych ROS   GI/Hepatic negative GI ROS, Neg liver ROS,   Endo/Other  negative endocrine ROS  Renal/GU      Musculoskeletal  (+) Arthritis , Osteoarthritis,  Polyarthralgia DJD   Abdominal   Peds  Hematology  (+) Blood dyscrasia, anemia ,   Anesthesia Other Findings   Past Medical History: No date: Arthritis     Comment:  left knee;  No date: Hyperlipidemia No date: Urethral stricture  History reviewed. No pertinent surgical history.  BMI    Body Mass Index: 29.81 kg/m      Reproductive/Obstetrics negative OB ROS                           Anesthesia Physical Anesthesia Plan  ASA: 3  Anesthesia Plan: General ETT   Post-op Pain Management: Tylenol PO (pre-op)* and Toradol IV (intra-op)*   Induction: Intravenous  PONV Risk Score and Plan: 2 and Ondansetron and  Dexamethasone  Airway Management Planned: Oral ETT  Additional Equipment:   Intra-op Plan:   Post-operative Plan: Extubation in OR  Informed Consent: I have reviewed the patients History and Physical, chart, labs and discussed the procedure including the risks, benefits and alternatives for the proposed anesthesia with the patient or authorized representative who has indicated his/her understanding and acceptance.     Dental Advisory Given  Plan Discussed with: Anesthesiologist, CRNA and Surgeon  Anesthesia Plan Comments:        Anesthesia Quick Evaluation

## 2022-03-27 NOTE — Progress Notes (Signed)
PROGRESS NOTE    [name] Bean  JFH:[id number] DOB: 1935-03-26 DOA: 03/23/2022 PCP: Baxter Hire, MD    Brief Narrative:  [name] Bean is a 87 y.o. male seen in ed with complaints of neck pain. Pt has had previous cervical laminectomy.  Patient at baseline ambulates with a cane. Today patient reports paresthesias in both his arms as well as his legs and difficulty with ambulation and weakness.Pt has severe spinal stenosis on MRI done today and case was discussed with neurosurgery on-call, Dr. Cari Caraway   Patient reported has not been ambulatory much for the past couple of weeks due to these above issues.  Has not been taking his Lasix for the past week  7/24 no overnight issues.  Cardiology stated patient mild to moderate risk for spine surgery.  No further testing needed.  Consultants:  Neurosurgery, cardiology  Procedures:   Antimicrobials:      Subjective: No shortness of breath, chest pain, or any other symptoms  Objective: Vitals:   03/26/22 2030 03/27/22 0554 03/27/22 0600 03/27/22 0828  BP: 115/70 100/61  123/67  Pulse: 66 (!) 57  (!) 57  Resp: '18 16  18  '$ Temp: 98.1 F (36.7 C) 98.1 F (36.7 C)  97.9 F (36.6 C)  TempSrc:      SpO2: 99% 97%  98%  Weight:   105.3 kg   Height:        Intake/Output Summary (Last 24 hours) at 03/27/2022 0857 Last data filed at 03/27/2022 0537 Gross per 24 hour  Intake 100 ml  Output 2400 ml  Net -2300 ml   Filed Weights   03/25/22 0302 03/26/22 0304 03/27/22 0600  Weight: 109.5 kg 111 kg 105.3 kg    Examination: Calm, NAD Cta no w/r Reg s1/s2 no gallop Soft benign +bs Decrease bs Aaoxox3  Mood and affect appropriate in current setting     Data Reviewed: I have personally reviewed following labs and imaging studies  CBC: Recent Labs  Lab 03/23/22 1655 03/24/22 0500  WBC 7.0 5.6  NEUTROABS 3.0  --   HGB 11.1* 11.4*  HCT 34.6* 35.6*  MCV 88.5 88.1  PLT 204 937   Basic Metabolic Panel: Recent Labs  Lab  03/23/22 1655 03/24/22 0500 03/25/22 0951 03/26/22 0910 03/27/22 0510  NA 140 140 138  --  139  K 3.9 4.0 3.6 3.7 3.6  CL 100 108 105  --  103  CO2 '28 28 26  '$ --  28  GLUCOSE 100* 108* 110*  --  96  BUN 22 18 24*  --  23  CREATININE 1.08 0.87 0.98  --  1.04  CALCIUM 9.6 9.7 9.5  --  9.1  PHOS  --   --   --   --  3.6   GFR: Estimated Creatinine Clearance: 67.1 mL/min (by C-G formula based on SCr of 1.04 mg/dL). Liver Function Tests: Recent Labs  Lab 03/23/22 1655 03/24/22 0500 03/27/22 0510  AST 29 27  --   ALT 25 24  --   ALKPHOS 98 106  --   BILITOT 0.6 0.8  --   PROT 7.2 6.9  --   ALBUMIN 4.0 3.6 3.1*   Recent Labs  Lab 03/23/22 1655  LIPASE 24   No results for input(s): "AMMONIA" in the last 168 hours. Coagulation Profile: No results for input(s): "INR", "PROTIME" in the last 168 hours. Cardiac Enzymes: Recent Labs  Lab 03/23/22 1655  CKTOTAL 70   BNP (last 3 results) No  results for input(s): "PROBNP" in the last 8760 hours. HbA1C: No results for input(s): "HGBA1C" in the last 72 hours.  CBG: No results for input(s): "GLUCAP" in the last 168 hours. Lipid Profile: No results for input(s): "CHOL", "HDL", "LDLCALC", "TRIG", "CHOLHDL", "LDLDIRECT" in the last 72 hours. Thyroid Function Tests: No results for input(s): "TSH", "T4TOTAL", "FREET4", "T3FREE", "THYROIDAB" in the last 72 hours.  Anemia Panel: No results for input(s): "VITAMINB12", "FOLATE", "FERRITIN", "TIBC", "IRON", "RETICCTPCT" in the last 72 hours. Sepsis Labs: No results for input(s): "PROCALCITON", "LATICACIDVEN" in the last 168 hours.  Recent Results (from the past 240 hour(s))  Surgical pcr screen     Status: None   Collection Time: 03/26/22  8:42 PM   Specimen: Nasal Mucosa; Nasal Swab  Result Value Ref Range Status   MRSA, PCR NEGATIVE NEGATIVE Final   Staphylococcus aureus NEGATIVE NEGATIVE Final    Comment: (NOTE) The Xpert SA Assay (FDA approved for NASAL specimens in patients  4 years of age and older), is one component of a comprehensive surveillance program. It is not intended to diagnose infection nor to guide or monitor treatment. Performed at [facility], [address] [place], [place] [address]          Radiology Studies: No results found.      Scheduled Meds:  pantoprazole  40 mg Oral BID   sodium chloride flush  3 mL Intravenous Q12H   tamsulosin  0.4 mg Oral Daily   Continuous Infusions:  Assessment & Plan:   Principal Problem:   Muscle weakness of upper extremity Active Problems:   Ambulatory dysfunction   Essential hypertension, benign   AAA (abdominal aortic aneurysm) (HCC)   Anemia   Sleep apnea   Iliac artery aneurysm (HCC)   Lymphedema of both lower extremities   Abnormal EKG   Muscle weakness of upper extremity Attribute to cervical spinal cord stenosis. We will start patient on Decadron and pain control. Neurovascular checks. 7/22 neurosurgery consulted input appreciated.  Recommend surgical intervention with posterior cervical fusion C3-4 with decompression 7/24 plan for surgery today Cardiology's input is was appreciated she is mild to moderate risk for spine surgery no further intervention.    Ambulatory dysfunction Again attribute to his cervical spinal cord stenosis patient also has moderate to severe lumbar spine degenerative disc disease. 7/23 fall precautions  7/24 surgery today Pain management and muscle relaxants      Iliac artery aneurysm AAA Saw vascular in 5/23. Stable, check in one year 7/24 BP control  CT here see result    OSA Noncompliant with CPAP     Essential HTN Stable Continue current regimen  Lymphedema bilateral lower extremity Followed by vascular Was not taking his Lasix for the past week, we resumed it 7/23 edema improving with lasix 7/24 hold lasix as cr. Up little. Going to surgery  Chronic anemia No evidence of bleed monitor  DVT prophylaxis:  lovenox, hold Sunday dose Code Status: Full Family Communication: none at bedside Disposition Plan:  Status is: Inpatient Remains inpatient appropriate because: IV treatment, surgery today        LOS: 3 days   Time spent 70 min    Nolberto Hanlon, MD [facility] Pager [phone]  If 7PM-7AM, please contact night-coverage 03/27/2022, 8:57 AM

## 2022-03-27 NOTE — Progress Notes (Signed)
Patient transported via bed to OR. Consent and MRSA screening. Son at bedside. No signs of acute distress noted.

## 2022-03-27 NOTE — Interval H&P Note (Signed)
History and Physical Interval Note:  03/27/2022 12:52 PM  [name] Bean  has presented today for surgery, with the diagnosis of cervical myelopathy, cervical stenosis.  The various methods of treatment have been discussed with the patient and family. After consideration of risks, benefits and other options for treatment, the patient has consented to  Procedure(s) with comments: POSTERIOR CERVICAL FUSION/FORAMINOTOMY LEVEL 5 (N/A) - C3-4 Posterior fusion with decompression as a surgical intervention.  The patient's history has been reviewed, patient examined, no change in status, stable for surgery.  I have reviewed the patient's chart and labs.  Questions were answered to the patient's satisfaction.    Heart sounds normal no MRG. Chest Clear to Auscultation Bilaterally.   [name] Bean

## 2022-03-27 NOTE — Op Note (Addendum)
Indications: [name] presented with progressive cervical myelopathy due to cervical stenosis.  He had worsening symptoms prompting admission and urgent intervention.  Findings: cervical stenosis  Preoperative Diagnosis: Cervical myelopathy, cervical stenosis Postoperative Diagnosis: same   EBL: 500 ml IVF: see AR ml Drains: 1 placed Disposition: Extubated and Stable to PACU Complications: none  No foley catheter was placed.   Preoperative Note:   Risks of surgery discussed include: infection, bleeding, stroke, coma, death, paralysis, CSF leak, nerve/spinal cord injury, numbness, tingling, weakness, complex regional pain syndrome, recurrent stenosis and/or disc herniation, vascular injury, development of instability, neck/back pain, need for further surgery, persistent symptoms, development of deformity, and the risks of anesthesia. The patient understood these risks and agreed to proceed.  Operative Note:   OPERATIVE PROCEDURE:  1. Posterior Nonsegmental Instrumentation C3-4 using Globus Quartex 2. Posterolateral arthrodesis from C3-4 3. Cervical Laminectomy from C3-C4 for decompression of the spinal cord 4. Harvesting of autograft via the same incision 5. Use of flouroscopy   OPERATIVE PROCEDURE:  After induction of general anesthesia, the Mayfield was placed. The patient was placed in the prone position on the operative table and the head secured with the Mayfield.  A midline incision was then planned using fluoroscopy.  A timeout was performed, and antibiotics given.  Next, the posterior cervical region was prepped and draped in the usual sterile fashion. The incision was injected with local anesthetic, the opened sharply. A subperiosteal dissection was then carried out to expose the remaining posterior elements from C2 and C4, with careful attention paid to maintaining the C2/3 facet capsule.  After satisfactory exposure had been obtained, our attention was turned to placement  of lateral mass screws.  On each side, the high speed drill was used to remove the soft tissue of the facet from C3/4. Lateral mass screws were then placed at each level using a modified Magerl technique. Briefly, a pilot hole was drilled in the lateral mass using the high-speed drill on each side.  Next, a drill was used to drill a tract in each lateral mass to 12m. A balltip probe was used to confirm lack of breach. We then placed 3.5x 16 mm screws at C3 and 3.5x 16 mm screws at C4.  Rods were measured and shaped, then secured to the screws according to manufacturer's specifications.  After placement of the lateral mass screws, the high speed drill was used to drill trough laminectomies from C3 to C4. The spinous processes were disconnected from the intraspinous ligaments at C2/3 and C4/5, then the ligamentum flavum was carefully divided at C2/3 and C4/5 using a Kerrison punch and upgoing curettes. The laminae and spinous processes were then removed. The decompression was completed using a Kerrison punch until the spinal cord was adequately decompressed and bilateral medial facetectomies and foraminotomies had been performed.   After decompression was complete, final AP and lateral radiographs were taken.  The wound was copiously irrigated with bacitracin-containing solution and hemostasis was achieved.  Using high-speed drill, the lateral margin of the lateral masses was gently decorticated.  The bone harvested from the laminectomy was processed and placed posterolaterally and in the facet joints to aid in arthrodesis.  A Hemovac drain was then placed in the wound deep to the fascia.   The wound was closed in a multilayer fashion using interrupted 0 and 2-0 Vicryl sutures.  The final skin edges were reapproximated using staples.   After closure, the patient was flipped supine and the Mayfield removed.  Patient was  then handed back over to anesthesia.  All counts were correct at the conclusion of the  procedure.  Neurological monitoring was used throughout, and there were no changes.  Cooper Render PA acted as an Pensions consultant throughout the case. An assistant was required for this procedure due to the complexity.  The assistant provided assistance in tissue manipulation and suction, and was required for the successful and safe performance of the procedure. I performed the critical portions of the procedure.   Meade Maw MD

## 2022-03-27 NOTE — Care Management Important Message (Signed)
Important Message  Patient Details  Name: [name] Bean MRN: [id number] Date of Birth: 08-04-1935   Medicare Important Message Given:  Yes     Dannette Barbara 03/27/2022, 11:36 AM

## 2022-03-28 ENCOUNTER — Encounter: Payer: Self-pay | Admitting: Neurosurgery

## 2022-03-28 DIAGNOSIS — M6281 Muscle weakness (generalized): Secondary | ICD-10-CM | POA: Diagnosis not present

## 2022-03-28 MED ORDER — POLYETHYLENE GLYCOL 3350 17 G PO PACK
17.0000 g | PACK | Freq: Every day | ORAL | Status: DC | PRN
  Administered 2022-03-29 – 2022-04-04 (×3): 17 g via ORAL
  Filled 2022-03-28 (×3): qty 1

## 2022-03-28 MED ORDER — ENOXAPARIN SODIUM 40 MG/0.4ML IJ SOSY
40.0000 mg | PREFILLED_SYRINGE | INTRAMUSCULAR | Status: DC
  Administered 2022-03-28 – 2022-04-04 (×8): 40 mg via SUBCUTANEOUS
  Filled 2022-03-28 (×8): qty 0.4

## 2022-03-28 MED ORDER — OXYCODONE HCL 5 MG PO TABS
5.0000 mg | ORAL_TABLET | ORAL | Status: DC | PRN
  Administered 2022-03-28: 5 mg via ORAL
  Filled 2022-03-28: qty 1

## 2022-03-28 MED ORDER — BISACODYL 10 MG RE SUPP
10.0000 mg | Freq: Every day | RECTAL | Status: DC | PRN
Start: 2022-03-28 — End: 2022-04-05
  Administered 2022-03-30: 10 mg via RECTAL
  Filled 2022-03-28 (×2): qty 1

## 2022-03-28 MED ORDER — MORPHINE SULFATE (PF) 2 MG/ML IV SOLN
1.0000 mg | INTRAVENOUS | Status: DC | PRN
  Administered 2022-03-29 – 2022-03-30 (×4): 1 mg via INTRAVENOUS
  Filled 2022-03-28 (×4): qty 1

## 2022-03-28 MED ORDER — OXYCODONE HCL 5 MG PO TABS
10.0000 mg | ORAL_TABLET | ORAL | Status: DC | PRN
  Administered 2022-03-28 – 2022-04-01 (×14): 10 mg via ORAL
  Filled 2022-03-28 (×16): qty 2

## 2022-03-28 NOTE — Discharge Instructions (Signed)
NEUROSURGERY DISCHARGE INSTRUCTIONS  Admission diagnosis: Paresthesia [R20.2] Cervical myelopathy ([place]) [G95.9] Upper extremity weakness [R29.898] Acute bilateral back pain, unspecified back location [M54.9]  Operative procedure: C3-4 decompression and fusion  What to do after you leave the hospital:  Recommended diet: regular diet. Increase protein intake to promote wound healing.  Recommended activity: no lifting, driving, or strenuous exercise for 4 weeks . You should walk multiple times per day  Special Instructions  No straining, no heavy lifting > 10lbs x 4 weeks.  Keep incision area clean and dry. May shower 2 days after surgery. No baths or pools for 6 weeks.  Please remove dressing tomorrow, no need to apply a bandage afterwards  You have sutures or staples that will be removed in clinic.  Please take pain medications as directed. Take a stool softener if on pain medications   Please Report any of the following: Nausea or Vomiting, Temperature is greater than 101.65F (38.1C) degrees, Dizziness, Abdominal Pain, Difficulty Breathing or Shortness of Breath, Inability to Eat, drink Fluids, or Take medications, Bleeding, swelling, or drainage from surgical incision sites, New numbness or weakness, and Bowel or bladder dysfunction to the neurosurgeon on call at [phone]  Additional Follow up appointments Please follow up with Cooper Render PA-C in [facility] as scheduled in 2-3 weeks   Please see below for scheduled appointments:  Future Appointments  Date Time Provider [place]  04/11/2022  3:30 PM Loleta Dicker, PA [facility] None  04/21/2022 10:45 AM Lucky Cowboy, Erskine Squibb, MD [facility] None  05/09/2022  3:30 PM Meade Maw, MD AS[facility]AS None  06/20/2022 11:30 AM Meade Maw, MD AS[facility]AS None  12/01/2022 10:00 AM [facility] VASC 1 [facility] None  12/01/2022 11:00 AM Dew, Erskine Squibb, MD [facility] None

## 2022-03-28 NOTE — Progress Notes (Signed)
PROGRESS NOTE    Gustin Zobrist  YBO:[id number] DOB: [birth year] DOA: 03/23/2022 PCP: Baxter Hire, MD    Brief Narrative:  Tyron Manetta is a 87 y.o. male seen in ed with complaints of neck pain. Pt has had previous cervical laminectomy.  Patient at baseline ambulates with a cane. Today patient reports paresthesias in both his arms as well as his legs and difficulty with ambulation and weakness.Pt has severe spinal stenosis on MRI done today and case was discussed with neurosurgery on-call, Dr. Cari Caraway   Patient reported has not been ambulatory much for the past couple of weeks due to these above issues.  Has not been taking his Lasix for the past week  7/24 no overnight issues.  Cardiology stated patient mild to moderate risk for spine surgery.  No further testing needed. 7/25 consult PT/OT  Consultants:  Neurosurgery, cardiology  Procedures:   Antimicrobials:      Subjective: No complaints of pain, shortness of breath or chest pain   Objective: Vitals:   03/27/22 1800 03/27/22 2236 03/28/22 0541 03/28/22 0805  BP: 98/71 (!) 94/58 (!) 97/53 (!) 120/44  Pulse: 88 84 70 74  Resp: '16 19 16 18  '$ Temp: 97.6 F (36.4 C) 98.6 F (37 C) 98.5 F (36.9 C) 98.7 F (37.1 C)  TempSrc: Oral     SpO2: 97% 98% 96% 97%  Weight:      Height:        Intake/Output Summary (Last 24 hours) at 03/28/2022 0902 Last data filed at 03/28/2022 0700 Gross per 24 hour  Intake 1720 ml  Output 1050 ml  Net 670 ml   Filed Weights   03/26/22 0304 03/27/22 0600 03/27/22 1213  Weight: 111 kg 105.3 kg 105.3 kg    Examination: Calm, NAD Cta no w/r Reg s1/s2 no gallop Soft benign +bs +edema b/l LE Awake and alert  Mood and affect appropriate in current setting     Data Reviewed: I have personally reviewed following labs and imaging studies  CBC: Recent Labs  Lab 03/23/22 1655 03/24/22 0500  WBC 7.0 5.6  NEUTROABS 3.0  --   HGB 11.1* 11.4*  HCT 34.6* 35.6*  MCV 88.5 88.1   PLT 204 277   Basic Metabolic Panel: Recent Labs  Lab 03/23/22 1655 03/24/22 0500 03/25/22 0951 03/26/22 0910 03/27/22 0510  NA 140 140 138  --  139  K 3.9 4.0 3.6 3.7 3.6  CL 100 108 105  --  103  CO2 '28 28 26  '$ --  28  GLUCOSE 100* 108* 110*  --  96  BUN 22 18 24*  --  23  CREATININE 1.08 0.87 0.98  --  1.04  CALCIUM 9.6 9.7 9.5  --  9.1  PHOS  --   --   --   --  3.6   GFR: Estimated Creatinine Clearance: 67.1 mL/min (by C-G formula based on SCr of 1.04 mg/dL). Liver Function Tests: Recent Labs  Lab 03/23/22 1655 03/24/22 0500 03/27/22 0510  AST 29 27  --   ALT 25 24  --   ALKPHOS 98 106  --   BILITOT 0.6 0.8  --   PROT 7.2 6.9  --   ALBUMIN 4.0 3.6 3.1*   Recent Labs  Lab 03/23/22 1655  LIPASE 24   No results for input(s): "AMMONIA" in the last 168 hours. Coagulation Profile: No results for input(s): "INR", "PROTIME" in the last 168 hours. Cardiac Enzymes: Recent Labs  Lab 03/23/22 1655  CKTOTAL 70   BNP (last 3 results) No results for input(s): "PROBNP" in the last 8760 hours. HbA1C: No results for input(s): "HGBA1C" in the last 72 hours.  CBG: No results for input(s): "GLUCAP" in the last 168 hours. Lipid Profile: No results for input(s): "CHOL", "HDL", "LDLCALC", "TRIG", "CHOLHDL", "LDLDIRECT" in the last 72 hours. Thyroid Function Tests: No results for input(s): "TSH", "T4TOTAL", "FREET4", "T3FREE", "THYROIDAB" in the last 72 hours.  Anemia Panel: No results for input(s): "VITAMINB12", "FOLATE", "FERRITIN", "TIBC", "IRON", "RETICCTPCT" in the last 72 hours. Sepsis Labs: No results for input(s): "PROCALCITON", "LATICACIDVEN" in the last 168 hours.  Recent Results (from the past 240 hour(s))  Surgical pcr screen     Status: None   Collection Time: 03/26/22  8:42 PM   Specimen: Nasal Mucosa; Nasal Swab  Result Value Ref Range Status   MRSA, PCR NEGATIVE NEGATIVE Final   Staphylococcus aureus NEGATIVE NEGATIVE Final    Comment: (NOTE) The  Xpert SA Assay (FDA approved for NASAL specimens in patients 29 years of age and older), is one component of a comprehensive surveillance program. It is not intended to diagnose infection nor to guide or monitor treatment. Performed at [facility], [address] [place], [place] [address]          Radiology Studies: DG Cervical Spine 2-3 Views  Result Date: 03/27/2022 CLINICAL DATA:  C3-C4 fusion and discectomy EXAM: CERVICAL SPINE - 2-3 VIEW COMPARISON:  03/23/2022 MRI cervical spine FINDINGS: Three fluoroscopic images are obtained during the performance of the procedure and are provided for interpretation only. Images demonstrate posterior fusion at C3-C4. Prior C4-C5 ACDF. Fluoroscopy time: 9 seconds 0.61 mGy IMPRESSION: Intraoperative documentation of C3-C4 fusion. Electronically Signed   By: Merilyn Baba M.D.   On: 03/27/2022 16:02   DG C-Arm 1-60 Min-No Report  Result Date: 03/27/2022 Fluoroscopy was utilized by the requesting physician.  No radiographic interpretation.   DG C-Arm 1-60 Min-No Report  Result Date: 03/27/2022 Fluoroscopy was utilized by the requesting physician.  No radiographic interpretation.        Scheduled Meds:  pantoprazole  40 mg Oral BID   sodium chloride flush  3 mL Intravenous Q12H   tamsulosin  0.4 mg Oral Daily   Continuous Infusions:  lactated ringers Stopped (03/27/22 1900)    Assessment & Plan:   Principal Problem:   Muscle weakness of upper extremity Active Problems:   Ambulatory dysfunction   Essential hypertension, benign   AAA (abdominal aortic aneurysm) (HCC)   Anemia   Sleep apnea   Iliac artery aneurysm (HCC)   Lymphedema of both lower extremities   Abnormal EKG   Muscle weakness of upper extremity Ambulatory dysfunction Attribute to cervical spinal cord stenosis. We will start patient on Decadron and pain control. Neurovascular checks. 7/22 neurosurgery consulted input appreciated.  Recommend  surgical intervention with posterior cervical fusion C3-4 with decompression 7/24 plan for surgery today Cardiology's input is was appreciated she is mild to moderate risk for spine surgery no further intervention. 7/25 fall precaution Status post surgery by vascular on 7/24 by Dr. Cari Caraway PT OT consulted Decadron stopped       Iliac artery aneurysm AAA Saw vascular in 5/23. Stable, check in one year 7/25 found on CT BP stable   OSA Noncompliant with CPAP    Essential HTN BP on low side but stable Continue to monitor Continue holding Lasix due to BP on lower side  Lymphedema bilateral lower extremity Followed by vascular Was  not taking his Lasix for the past week, we resumed it 7/23 edema improving with lasix 7/24 hold lasix as cr. Up little. Going to surgery  Chronic anemia No evidence of bleed monitor  DVT prophylaxis: l Lovenox Code Status: Full Family Communication: none at bedside Disposition Plan:  Status is: Inpatient Remains inpatient appropriate because: IV treatment, PT OT pending had surgery yesterday        LOS: 4 days   Time spent 35 min    Nolberto Hanlon, MD [facility] Pager [phone]  If 7PM-7AM, please contact night-coverage 03/28/2022, 9:02 AM

## 2022-03-28 NOTE — Progress Notes (Signed)
Patient stated his 600 pair glasses was discarded in linen.

## 2022-03-28 NOTE — Progress Notes (Signed)
    Attending Progress Note  History: [name] Bean is s/p C3-4 decompression and fusion   POD1: Posterior neck pain as expected.  Physical Exam: Vitals:   03/28/22 0541 03/28/22 0805  BP: (!) 97/53 (!) 120/44  Pulse: 70 74  Resp: 16 18  Temp: 98.5 F (36.9 C) 98.7 F (37.1 C)  SpO2: 96% 97%    AA Ox3 CNI  Strength: Side Biceps Triceps Deltoid Interossei Grip Wrist Ext. Wrist Flex.  R '5 5 5 5 4 '$ 4- 4+  L '5 5 5 5 4 3 '$ 4+    Side Iliopsoas Quads Hamstring PF DF EHL  R '5 5 5 5 5 5  '$ L '4 5 5 5 5 5   '$ HV 124m since surgery  Data:  Recent Labs  Lab 03/24/22 0500 03/25/22 0951 03/26/22 0910 03/27/22 0510  NA 140 138  --  139  K 4.0 3.6   < > 3.6  CL 108 105  --  103  CO2 28 26  --  28  BUN 18 24*  --  23  CREATININE 0.87 0.98  --  1.04  GLUCOSE 108* 110*  --  96  CALCIUM 9.7 9.5  --  9.1   < > = values in this interval not displayed.   Recent Labs  Lab 03/24/22 0500  AST 27  ALT 24  ALKPHOS 106     Recent Labs  Lab 03/23/22 1655 03/24/22 0500  WBC 7.0 5.6  HGB 11.1* 11.4*  HCT 34.6* 35.6*  PLT 204 204   No results for input(s): "APTT", "INR" in the last 168 hours.       Other tests/results: none   Assessment/Plan:  [name] Merlois a 87y.o s/p cervical decompression and fusion for cervical myelopathy.   - mobilize - pain control - DVT prophylaxis - PTOT - will keep HV drain for now  - remainder of care per medicine   DCooper RenderPA-C Department of Neurosurgery

## 2022-03-29 DIAGNOSIS — M6281 Muscle weakness (generalized): Secondary | ICD-10-CM | POA: Diagnosis not present

## 2022-03-29 LAB — BASIC METABOLIC PANEL
Anion gap: 8 (ref 5–15)
BUN: 18 mg/dL (ref 8–23)
CO2: 26 mmol/L (ref 22–32)
Calcium: 9.1 mg/dL (ref 8.9–10.3)
Chloride: 103 mmol/L (ref 98–111)
Creatinine, Ser: 0.8 mg/dL (ref 0.61–1.24)
GFR, Estimated: >60 mL/min (ref >60)
Glucose, Bld: 97 mg/dL (ref 70–99)
Potassium: 3.8 mmol/L (ref 3.5–5.1)
Sodium: 137 mmol/L (ref 135–145)

## 2022-03-29 LAB — CBC
HCT: 29.7 % — ABNORMAL LOW (ref 39.0–52.0)
Hemoglobin: 9.8 g/dL — ABNORMAL LOW (ref 13.0–17.0)
MCH: 28.7 pg (ref 26.0–34.0)
MCHC: 33 g/dL (ref 30.0–36.0)
MCV: 86.8 fL (ref 80.0–100.0)
Platelets: 146 10*3/uL — ABNORMAL LOW (ref 150–400)
RBC: 3.42 MIL/uL — ABNORMAL LOW (ref 4.22–5.81)
RDW: 15.2 % (ref 11.5–15.5)
WBC: 9.5 10*3/uL (ref 4.0–10.5)
nRBC: 0 % (ref 0.0–0.2)

## 2022-03-29 MED ORDER — DOCUSATE SODIUM 100 MG PO CAPS
100.0000 mg | ORAL_CAPSULE | Freq: Every day | ORAL | Status: DC
  Administered 2022-03-29 – 2022-03-30 (×2): 100 mg via ORAL
  Filled 2022-03-29 (×2): qty 1

## 2022-03-29 NOTE — TOC Initial Note (Signed)
Transition of Care [facility]) - Initial/Assessment Note    Patient Details  Name: [name] Bean MRN: [id number] Date of Birth: 05-31-1935  [facility]) CM/SW Contact:    Candie Chroman, LCSW Phone Number: 03/29/2022, 10:57 AM  Clinical Narrative:   CSW met with patient. No supports at bedside. CSW introduced role and explained that PT recommendations would be discussed. Patient is agreeable to SNF placement. No facility preference. Gave CMS scores for facilities within 25 miles of his zip code. Patient does not have much family assistance in the area. He has a son that lives in [place] and patient says he has been trying to get him to move up there. Patient voiced that he may consider that now. Encouraged him to go ahead and start discussing that with his son. No further concerns. CSW encouraged patient to contact CSW as needed. CSW will continue to follow patient for support and facilitate discharge to SNF once medically stable.               Expected Discharge Plan: Skilled Nursing Facility Barriers to Discharge: Continued Medical Work up   Patient Goals and CMS Choice   CMS Medicare.gov Compare Post Acute Care list provided to:: Patient    Expected Discharge Plan and Services Expected Discharge Plan: [place] Choice: [place] arrangements for the past 2 months: Apartment                                      Prior Living Arrangements/Services Living arrangements for the past 2 months: Apartment Lives with:: Self Patient language and need for interpreter reviewed:: Yes Do you feel safe going back to the place where you live?: Yes      Need for Family Participation in Patient Care: Yes (Comment) Care giver support system in place?: Yes (comment)   Criminal Activity/Legal Involvement Pertinent to Current Situation/Hospitalization: No - Comment as needed  Activities of Daily Living Home Assistive  Devices/Equipment: Cane (specify quad or straight), Walker (specify type) ADL Screening (condition at time of admission) Patient's cognitive ability adequate to safely complete daily activities?: Yes Is the patient deaf or have difficulty hearing?: No Does the patient have difficulty seeing, even when wearing glasses/contacts?: No Does the patient have difficulty concentrating, remembering, or making decisions?: No Patient able to express need for assistance with ADLs?: Yes Does the patient have difficulty dressing or bathing?: Yes Independently performs ADLs?: No Communication: Needs assistance Is this a change from baseline?: Pre-admission baseline Dressing (OT): Needs assistance Is this a change from baseline?: Pre-admission baseline Grooming: Needs assistance Is this a change from baseline?: Pre-admission baseline Feeding: Independent Bathing: Needs assistance Is this a change from baseline?: Pre-admission baseline Toileting: Needs assistance Is this a change from baseline?: Pre-admission baseline In/Out Bed: Needs assistance Is this a change from baseline?: Pre-admission baseline Walks in Home: Needs assistance Is this a change from baseline?: Pre-admission baseline Does the patient have difficulty walking or climbing stairs?: Yes Weakness of Legs: Both Weakness of Arms/Hands: Both  Permission Sought/Granted Permission sought to share information with : Facility Art therapist granted to share information with : Yes, Verbal Permission Granted     Permission granted to share info w AGENCY: SNF's        Emotional Assessment Appearance:: Appears stated age Attitude/Demeanor/Rapport: Engaged, Gracious Affect (typically observed): Accepting, Appropriate, Calm, Pleasant  Orientation: : Oriented to Self, Oriented to Place, Oriented to  Time, Oriented to Situation Alcohol / Substance Use: Not Applicable Psych Involvement: No (comment)  Admission diagnosis:   Paresthesia [R20.2] Cervical myelopathy (HCC) [G95.9] Upper extremity weakness [R29.898] Acute bilateral back pain, unspecified back location [M54.9] Patient Active Problem List   Diagnosis Date Noted   Stenosis of cervical spine with myelopathy ([place]) 03/24/2022   Muscle weakness of upper extremity 03/24/2022   Abnormal EKG 03/24/2022   Lower limb ulcer, calf, left, limited to breakdown of skin ([place]) 08/30/2021   Ulcer of left lower extremity, limited to breakdown of skin ([place]) 05/02/2021   Primary osteoarthritis of right knee 05/02/2021   Pain in joint of left knee 08/24/2020   Pain in joint of right knee 08/24/2020   Mild asthma 01/20/2019   Anemia 07/02/2018   Hyperlipidemia 07/02/2018   Obesity 07/02/2018   Backache 07/02/2018   Postoperative state 07/02/2018   Sleep apnea 07/02/2018   Spinal stenosis of lumbar region 07/02/2018   Lymphedema of both lower extremities 05/23/2017   Polyarthralgia 05/23/2017   Left ankle swelling 05/21/2017   Neuropathy 05/21/2017   Abnormal gait 05/15/2017   Ambulatory dysfunction 05/15/2017   Weakness 05/15/2017   Iliac artery aneurysm (HCC) 09/06/2016   Essential hypertension, benign 09/06/2016   Swelling of limb 09/06/2016   Dizziness 07/25/2016   Lumbar spondylosis 06/09/2016   Incomplete tear of left rotator cuff 10/15/2015   Aortic ejection murmur 01/08/2015   Bilateral edema of lower extremity 01/08/2015   Impingement syndrome of both shoulders 01/01/2015   Rotator cuff tendinitis, left 01/01/2015   Primary osteoarthritis of left knee 11/16/2014   Increased frequency of urination 06/06/2014   Starting and stopping of urinary stream during micturition 06/06/2014   Bright red rectal bleeding 06/04/2014   Constipation due to pain medication 06/04/2014   Balanitis 04/16/2014   Phimosis 04/16/2014   Redundant prepuce and phimosis 04/16/2014   Acute cystitis 09/16/2012   Benign prostatic hyperplasia with urinary obstruction  09/16/2012   Bladder neoplasm of uncertain malignant potential 09/16/2012   Candidiasis of urogenital sites 09/16/2012   Incomplete emptying of bladder 09/16/2012   Other long term (current) drug therapy 09/16/2012   ED (erectile dysfunction) of organic origin 09/16/2012   Hypogonadism in male 09/16/2012   AAA (abdominal aortic aneurysm) ([place]) 09/04/2012   PCP:  Baxter Hire, MD Pharmacy:   [id number]: [phone]: [phone]    Social Determinants of Health (SDOH) Interventions    Readmission Risk Interventions     No data to display

## 2022-03-29 NOTE — Progress Notes (Signed)
    Attending Progress Note  History: Gilverto Dileonardo is s/p C3-4 decompression and fusion   POD2: continued neck pain.  POD1: Posterior neck pain as expected.  Physical Exam: Vitals:   03/29/22 0940 03/29/22 1010  BP:    Pulse:    Resp: 16 18  Temp:    SpO2:      AA Ox3 CNI  Strength: Side Biceps Triceps Deltoid Interossei Grip Wrist Ext. Wrist Flex.  R '5 5 5 5 4 '$ 4- 4+  L '5 5 5 5 4 3 '$ 4+    Side Iliopsoas Quads Hamstring PF DF EHL  R '5 5 5 5 5 5  '$ L '4 5 5 5 5 5   '$ HV 0  Data:  Recent Labs  Lab 03/25/22 0951 03/26/22 0910 03/27/22 0510 03/29/22 0537  NA 138  --  139 137  K 3.6   < > 3.6 3.8  CL 105  --  103 103  CO2 26  --  28 26  BUN 24*  --  23 18  CREATININE 0.98  --  1.04 0.80  GLUCOSE 110*  --  96 97  CALCIUM 9.5  --  9.1 9.1   < > = values in this interval not displayed.    Recent Labs  Lab 03/24/22 0500  AST 27  ALT 24  ALKPHOS 106      Recent Labs  Lab 03/23/22 1655 03/24/22 0500 03/29/22 0537  WBC 7.0 5.6 9.5  HGB 11.1* 11.4* 9.8*  HCT 34.6* 35.6* 29.7*  PLT 204 204 146*    No results for input(s): "APTT", "INR" in the last 168 hours.       Other tests/results: none   Assessment/Plan:  Mikiah Demond is a 87 y.o s/p cervical decompression and fusion for cervical myelopathy.   - mobilize - pain control - DVT prophylaxis - PTOT - HV removed 7/26 - will follow up outpatient in 2 weeks for staple removal - remainder of care per medicine   Cooper Render PA-C Department of Neurosurgery

## 2022-03-29 NOTE — Evaluation (Signed)
Physical Therapy Evaluation Patient Details Name: [name] Bean MRN: [id number] DOB: 09/03/1935 Today's Date: 03/29/2022  History of Present Illness  [name] Bean is a 87 y.o. male seen in ed with complaints of neck pain.  Pt has had previous cervical laminectomy.  Patient at baseline ambulates with a cane.  Today patient reports paresthesias in both his arms as well as his legs and difficulty with ambulation and weakness.  Patient reports pain that started in the left shoulder going to the right and also down his spine. .  Patient does report history of steroids for his arthritis.  Pt has severe spinal stenosis on MRI done today and case was discussed with neurosurgery on-call, Dr. Cari Caraway. Patient is s/p s/p C3-4 decompression and fusion 03/27/22.   Clinical Impression  Patient received in bed, eyes closed, he is reporting pain all over. 10/10 pain in neck. Patient requires mod assist with bed mobility and increased time to perform. He was able to stand on second attempt from elevated bed ( quite high) and mod assist. Cues needed for hand placement and posture. Patient was able to take 2-3 steps at edge pf bed with min guard. Patient will continue to benefit from skilled PT to improve strength and functional independence.        Recommendations for follow up therapy are one component of a multi-disciplinary discharge planning process, led by the attending physician.  Recommendations may be updated based on patient status, additional functional criteria and insurance authorization.  Follow Up Recommendations Skilled nursing-short term rehab (<3 hours/day) Can patient physically be transported by private vehicle: No    Assistance Recommended at Discharge Frequent or constant Supervision/Assistance  Patient can return home with the following  A lot of help with walking and/or transfers;A lot of help with bathing/dressing/bathroom;Help with stairs or ramp for entrance;Assistance with  cooking/housework;Direct supervision/assist for medications management    Equipment Recommendations None recommended by PT  Recommendations for Other Services       Functional Status Assessment Patient has had a recent decline in their functional status and demonstrates the ability to make significant improvements in function in a reasonable and predictable amount of time.     Precautions / Restrictions Precautions Precautions: Fall;Cervical Precaution Booklet Issued: No Restrictions Weight Bearing Restrictions: No      Mobility  Bed Mobility Overal bed mobility: Needs Assistance Bed Mobility: Supine to Sit, Sit to Supine     Supine to sit: HOB elevated, Mod assist Sit to supine: Mod assist   General bed mobility comments: increased time and effort needed, mod assist to get his legs back up onto bed    Transfers Overall transfer level: Needs assistance Equipment used: Rolling walker (2 wheels) Transfers: Sit to/from Stand Sit to Stand: From elevated surface, Mod assist           General transfer comment: able to stand on second attempt from VERY elevated bed with mod assist    Ambulation/Gait Ambulation/Gait assistance: Min assist   Assistive device: Rolling walker (2 wheels) Gait Pattern/deviations: Step-to pattern Gait velocity: decr     General Gait Details: patient able to take a couple of side steps at edge of bed. Weakness overall in all extremities and pain limiting ability.  Stairs            Wheelchair Mobility    Modified Rankin (Stroke Patients Only)       Balance Overall balance assessment: Needs assistance Sitting-balance support: Feet supported Sitting balance-Leahy Scale: Fair  Standing balance support: Bilateral upper extremity supported, During functional activity, Reliant on assistive device for balance Standing balance-Leahy Scale: Poor                               Pertinent Vitals/Pain Pain  Assessment Pain Assessment: 0-10 Pain Score: 10-Worst pain ever Pain Location: neck, all over Pain Descriptors / Indicators: Discomfort, Grimacing, Guarding, Aching, Moaning, Sore Pain Intervention(s): Monitored during session, Limited activity within patient's tolerance, Repositioned, RN gave pain meds during session    Home Living Family/patient expects to be discharged to:: Skilled nursing facility Living Arrangements: Alone               Home Equipment: Conservation officer, nature (2 wheels);Cane - single point      Prior Function Prior Level of Function : Independent/Modified Independent;Driving             Mobility Comments: patient reports he was using walker prior to surgery ( for the last few weeks) ADLs Comments: having a difficult time lately due to pain and weakness     Hand Dominance        Extremity/Trunk Assessment   Upper Extremity Assessment Upper Extremity Assessment: Generalized weakness    Lower Extremity Assessment Lower Extremity Assessment: Generalized weakness    Cervical / Trunk Assessment Cervical / Trunk Assessment: Neck Surgery (keeps neck in forward flexed posture)  Communication   Communication: Other (comment) (somewhat garbled speech, difficult to understand at times)  Cognition Arousal/Alertness: Awake/alert Behavior During Therapy: WFL for tasks assessed/performed Overall Cognitive Status: Within Functional Limits for tasks assessed                                 General Comments: can be slightly agitated at times, particular        General Comments      Exercises     Assessment/Plan    PT Assessment Patient needs continued PT services  PT Problem List Decreased strength;Decreased mobility;Decreased range of motion;Decreased activity tolerance;Decreased balance;Pain;Impaired sensation;Decreased knowledge of precautions       PT Treatment Interventions DME instruction;Therapeutic exercise;Gait training;Balance  training;Stair training;Functional mobility training;Therapeutic activities;Patient/family education;Neuromuscular re-education    PT Goals (Current goals can be found in the Care Plan section)  Acute Rehab PT Goals Patient Stated Goal: to improve, decrease pain PT Goal Formulation: With patient Time For Goal Achievement: 04/12/22 Potential to Achieve Goals: Fair    Frequency 7X/week     Co-evaluation               AM-PAC PT "6 Clicks" Mobility  Outcome Measure Help needed turning from your back to your side while in a flat bed without using bedrails?: A Lot Help needed moving from lying on your back to sitting on the side of a flat bed without using bedrails?: A Lot Help needed moving to and from a bed to a chair (including a wheelchair)?: Total Help needed standing up from a chair using your arms (e.g., wheelchair or bedside chair)?: A Lot Help needed to walk in hospital room?: Total Help needed climbing 3-5 steps with a railing? : Total 6 Click Score: 9    End of Session Equipment Utilized During Treatment: Gait belt Activity Tolerance: Patient limited by pain;Other (comment) (weakness) Patient left: in bed;with call bell/phone within reach;with bed alarm set;with nursing/sitter in room Nurse Communication: Mobility status PT Visit Diagnosis: Unsteadiness  on feet (R26.81);Other abnormalities of gait and mobility (R26.89);Muscle weakness (generalized) (M62.81);Difficulty in walking, not elsewhere classified (R26.2);Pain Pain - Right/Left:  (all over)    Time: 9106-8166 PT Time Calculation (min) (ACUTE ONLY): 33 min   Charges:   PT Evaluation $PT Eval Moderate Complexity: 1 Mod PT Treatments $Therapeutic Activity: 8-22 mins        [employer], PT, [employer] 03/29/22,10:02 AM

## 2022-03-29 NOTE — NC FL2 (Signed)
[place] LEVEL OF CARE SCREENING TOOL     IDENTIFICATION  Patient Name: [name] Bean Birthdate: 09/08/1934 Sex: male Admission Date (Current Location): 03/23/2022  [place] and [place] Number:  Engineering geologist and Address:  [facility], [address], [place], [place] [address]      Provider Number: [id number]  Attending Physician Name and Address:  Nolberto Hanlon, MD  Relative Name and Phone Number:       Current Level of Care: Hospital Recommended Level of Care: [place] Prior Approval Number:    Date Approved/Denied:   PASRR Number: [id number]  Discharge Plan: SNF    Current Diagnoses: Patient Active Problem List   Diagnosis Date Noted   Stenosis of cervical spine with myelopathy ([place]) 03/24/2022   Muscle weakness of upper extremity 03/24/2022   Abnormal EKG 03/24/2022   Lower limb ulcer, calf, left, limited to breakdown of skin ([place]) 08/30/2021   Ulcer of left lower extremity, limited to breakdown of skin ([place]) 05/02/2021   Primary osteoarthritis of right knee 05/02/2021   Pain in joint of left knee 08/24/2020   Pain in joint of right knee 08/24/2020   Mild asthma 01/20/2019   Anemia 07/02/2018   Hyperlipidemia 07/02/2018   Obesity 07/02/2018   Backache 07/02/2018   Postoperative state 07/02/2018   Sleep apnea 07/02/2018   Spinal stenosis of lumbar region 07/02/2018   Lymphedema of both lower extremities 05/23/2017   Polyarthralgia 05/23/2017   Left ankle swelling 05/21/2017   Neuropathy 05/21/2017   Abnormal gait 05/15/2017   Ambulatory dysfunction 05/15/2017   Weakness 05/15/2017   Iliac artery aneurysm ([place]) 09/06/2016   Essential hypertension, benign 09/06/2016   Swelling of limb 09/06/2016   Dizziness 07/25/2016   Lumbar spondylosis 06/09/2016   Incomplete tear of left rotator cuff 10/15/2015   Aortic ejection murmur 01/08/2015   Bilateral edema of lower extremity 01/08/2015    Impingement syndrome of both shoulders 01/01/2015   Rotator cuff tendinitis, left 01/01/2015   Primary osteoarthritis of left knee 11/16/2014   Increased frequency of urination 06/06/2014   Starting and stopping of urinary stream during micturition 06/06/2014   Bright red rectal bleeding 06/04/2014   Constipation due to pain medication 06/04/2014   Balanitis 04/16/2014   Phimosis 04/16/2014   Redundant prepuce and phimosis 04/16/2014   Acute cystitis 09/16/2012   Benign prostatic hyperplasia with urinary obstruction 09/16/2012   Bladder neoplasm of uncertain malignant potential 09/16/2012   Candidiasis of urogenital sites 09/16/2012   Incomplete emptying of bladder 09/16/2012   Other long term (current) drug therapy 09/16/2012   ED (erectile dysfunction) of organic origin 09/16/2012   Hypogonadism in male 09/16/2012   AAA (abdominal aortic aneurysm) ([place]) 09/04/2012    Orientation RESPIRATION BLADDER Height & Weight     Self, Time, Situation, Place  Normal Continent, External catheter Weight: 229 lb 4.5 oz (104 kg) Height:  '6\' 2"'$  (188 cm)  BEHAVIORAL SYMPTOMS/MOOD NEUROLOGICAL BOWEL NUTRITION STATUS   (None)  (None) Continent Diet (DYS 3)  AMBULATORY STATUS COMMUNICATION OF NEEDS Skin   Limited Assist Verbally Skin abrasions, Other (Comment), Surgical wounds (Erythema/redness. Incision on neck (honeycomb). Non-pressure wound on right pretibial (no dressing).)                       Personal Care Assistance Level of Assistance  Bathing, Feeding, Dressing Bathing Assistance: Maximum assistance Feeding assistance: Limited assistance Dressing Assistance: Maximum assistance     Functional Limitations  Info  Sight, Hearing, Speech Sight Info: Adequate Hearing Info: Adequate Speech Info: Adequate    SPECIAL CARE FACTORS FREQUENCY  PT (By licensed PT), OT (By licensed OT)     PT Frequency: 5 x week OT Frequency: 5 x week            Contractures Contractures Info:  Not present    Additional Factors Info  Code Status, Allergies Code Status Info: Full code Allergies Info: Amoxicillin, Amoxicillin-pot Clavulanate, Mecobalamin           Current Medications (03/29/2022):  This is the current hospital active medication list Current Facility-Administered Medications  Medication Dose Route Frequency Provider Last Rate Last Admin   acetaminophen (TYLENOL) tablet 650 mg  650 mg Oral Q6H PRN Meade Maw, MD       Or   acetaminophen (TYLENOL) suppository 650 mg  650 mg Rectal Q6H PRN Meade Maw, MD       albuterol (PROVENTIL) (2.5 MG/3ML) 0.083% nebulizer solution 3 mL  3 mL Inhalation Q6H PRN Meade Maw, MD       bisacodyl (DULCOLAX) suppository 10 mg  10 mg Rectal Daily PRN Nolberto Hanlon, MD       enoxaparin (LOVENOX) injection 40 mg  40 mg Subcutaneous Q24H Nolberto Hanlon, MD   40 mg at 03/28/22 2130   hydrALAZINE (APRESOLINE) injection 10 mg  10 mg Intravenous Q4H PRN Meade Maw, MD       lactated ringers infusion   Intravenous Continuous Meade Maw, MD   Stopped at 03/27/22 1900   morphine (PF) 2 MG/ML injection 1 mg  1 mg Intravenous Q2H PRN Loleta Dicker, PA   1 mg at 03/29/22 0940   oxyCODONE (Oxy IR/ROXICODONE) immediate release tablet 10 mg  10 mg Oral Q4H PRN Loleta Dicker, PA   10 mg at 03/29/22 0355   oxyCODONE (Oxy IR/ROXICODONE) immediate release tablet 5 mg  5 mg Oral Q4H PRN Loleta Dicker, PA   5 mg at 03/28/22 1511   pantoprazole (PROTONIX) EC tablet 40 mg  40 mg Oral BID Meade Maw, MD   40 mg at 03/29/22 0939   polyethylene glycol (MIRALAX / GLYCOLAX) packet 17 g  17 g Oral Daily PRN Nolberto Hanlon, MD       sodium chloride flush (NS) 0.9 % injection 3 mL  3 mL Intravenous Q12H Meade Maw, MD   3 mL at 03/29/22 0940   tamsulosin (FLOMAX) capsule 0.4 mg  0.4 mg Oral Daily Meade Maw, MD   0.4 mg at 03/29/22 0940     Discharge Medications: Please see discharge summary  for a list of discharge medications.  Relevant Imaging Results:  Relevant Lab Results:   Additional Information SS#: [id number]  Candie Chroman, LCSW

## 2022-03-29 NOTE — Evaluation (Addendum)
Occupational Therapy Evaluation Patient Details Name: [name] Bean MRN: [id number] DOB: 05/01/1935 Today's Date: 03/29/2022   History of Present Illness Pt. is a 87 y.o. male seen in ed with complaints of neck pain.  Pt has had previous cervical laminectomy.  Patient at baseline ambulates with a cane.  Today patient reports paresthesias in both his arms as well as his legs and difficulty with ambulation and weakness.  Patient reports pain that started in the left shoulder going to the right and also down his spine. .  Patient does report history of steroids for his arthritis.  Pt has severe spinal stenosis on MRI done today and case was discussed with neurosurgery on-call, Dr. Cari Caraway. Patient is s/p s/p C3-4 decompression and fusion 03/27/22.   Clinical Impression   Pt. presents with 10/10 neck pain, weakness, Limited bilateral shoulder ROM, limited activity tolerance, and limited functional mobility which hinders his ability to complete basic ADL and IADL functioning. Pt. resides at home alone. Pt. reports being independent with ADLs, and IADL functioning: including meal preparation, and medication management. Pt. was able to drive. Pt. Reports having experienced progressive weakness for several weeks leading up to hospitalization. Pt. requires min-modA for self-feeding with assist to stabilize a cup when performing the hand to mouth pattern. Pt. requires MaxA for UE, and Total A for LE ADLs. Pt. will benefit from OT services for ADL training, A/E training, and pt./caregiver education about positioning, home modification, and DME. Pt. would benefit from SNF level of care upon discharge, with follow-up OT services.      Recommendations for follow up therapy are one component of a multi-disciplinary discharge planning process, led by the attending physician.  Recommendations may be updated based on patient status, additional functional criteria and insurance authorization.   Follow Up  Recommendations  Skilled nursing-short term rehab (<3 hours/day)    Assistance Recommended at Discharge    Patient can return home with the following Two people to help with walking and/or transfers;Assistance with feeding;A lot of help with bathing/dressing/bathroom;Assistance with cooking/housework;Assist for transportation;Help with stairs or ramp for entrance;Direct supervision/assist for medications management    Functional Status Assessment  Patient has had a recent decline in their functional status and demonstrates the ability to make significant improvements in function in a reasonable and predictable amount of time.  Equipment Recommendations  BSC/3in1    Recommendations for Other Services       Precautions / Restrictions Precautions Precautions: Fall;Cervical Precaution Booklet Issued: No Restrictions Weight Bearing Restrictions: No      Mobility Bed Mobility Overal bed mobility: Needs Assistance Bed Mobility: Supine to Sit, Sit to Supine     Supine to sit: HOB elevated, Mod assist Sit to supine: Mod assist        Transfers Overall transfer level: Needs assistance Equipment used: Rolling walker (2 wheels) Transfers: Sit to/from Stand Sit to Stand: From elevated surface, Mod assist           General transfer comment: Mobility: Per PT report      Balance                                           ADL either performed or assessed with clinical judgement   ADL Overall ADL's : Needs assistance/impaired Eating/Feeding: Minimal assistance;Moderate assistance Eating/Feeding Details (indicate cue type and reason): Pt. edcuation about positioning items on meal tray secondary to  limited shoulder ROM 2/2 pain Grooming: Set up;Moderate assistance   Upper Body Bathing: Maximal assistance   Lower Body Bathing: Total assistance   Upper Body Dressing : Maximal assistance   Lower Body Dressing: Total assistance                        Vision    Wears glasses. No change from baseline.     Perception     Praxis      Pertinent Vitals/Pain Pain Assessment Pain Assessment: 0-10 Pain Score: 10-Worst pain ever Pain Location: Left side of neck, radiating down. Pain Descriptors / Indicators: Discomfort, Grimacing, Guarding, Aching, Moaning, Sore Pain Intervention(s): Limited activity within patient's tolerance     Hand Dominance Right   Extremity/Trunk Assessment Upper Extremity Assessment Upper Extremity Assessment: Generalized weakness (Very limited bilateral active shoulder ROM 2/2 pain) Numbness, and tingling in the bilateral UE's, hands, and digits.    Lower Extremity Assessment Lower Extremity Assessment: Generalized weakness   Cervical / Trunk Assessment Cervical / Trunk Assessment: Neck Surgery (keeps neck in forward flexed posture)   Communication Communication Communication: Other (comment) (somewhat garbled speech, difficult to understand at times)   Cognition Arousal/Alertness: Awake/alert Behavior During Therapy: WFL for tasks assessed/performed Overall Cognitive Status: Within Functional Limits for tasks assessed                                       General Comments       Exercises     Shoulder Instructions      Home Living Family/patient expects to be discharged to:: Skilled nursing facility Living Arrangements: Alone Available Help at Discharge: Family (Son resides in [place]) Type of Home: House             Bathroom Shower/Tub: Tub/shower unit         Home Equipment: Conservation officer, nature (2 wheels);Cane - single point          Prior Functioning/Environment Prior Level of Function : Independent/Modified Independent;Driving             Mobility Comments: patient reports he was using walker prior to surgery ( for the last few weeks) ADLs Comments: Per Pt. report, Pt. was Independent with ADLs, and IADLs with progressive weakness over the past  several weeks.        OT Problem List: Decreased strength;Decreased cognition;Decreased range of motion;Impaired UE functional use;Decreased activity tolerance      OT Treatment/Interventions: Self-care/ADL training;Therapeutic exercise;Neuromuscular education;Therapeutic activities;DME and/or AE instruction    OT Goals(Current goals can be found in the care plan section) Acute Rehab OT Goals Patient Stated Goal: To regian independence OT Goal Formulation: With patient Time For Goal Achievement: 04/22/22 Potential to Achieve Goals: Fair  OT Frequency: Min 2X/week    Co-evaluation              AM-PAC OT "6 Clicks" Daily Activity     Outcome Measure Help from another person eating meals?: A Lot Help from another person taking care of personal grooming?: A Lot Help from another person toileting, which includes using toliet, bedpan, or urinal?: A Lot Help from another person bathing (including washing, rinsing, drying)?: Total Help from another person to put on and taking off regular upper body clothing?: A Lot Help from another person to put on and taking off regular lower body clothing?: Total 6 Click Score: 10  End of Session    Activity Tolerance: Patient tolerated treatment well Patient left: in bed  OT Visit Diagnosis: Muscle weakness (generalized) (M62.81)                Time: 9574-7340 OT Time Calculation (min): 20 min Charges:  OT General Charges $OT Visit: 1 Visit OT Evaluation $OT Eval Moderate Complexity: 1 Mod  Harrel Carina, MS, OTR/L   Harrel Carina 03/29/2022, 11:58 AM

## 2022-03-29 NOTE — Progress Notes (Signed)
PROGRESS NOTE    [name] Bean  ZHY:[id number] DOB: [birth year] DOA: 03/23/2022 PCP: Baxter Hire, MD    Brief Narrative:  [name] Bean is a 87 y.o. male seen in ed with complaints of neck pain. Pt has had previous cervical laminectomy.  Patient at baseline ambulates with a cane. Today patient reports paresthesias in both his arms as well as his legs and difficulty with ambulation and weakness.Pt has severe spinal stenosis on MRI done today and case was discussed with neurosurgery on-call, Dr. Cari Caraway   Patient reported has not been ambulatory much for the past couple of weeks due to these above issues.  Has not been taking his Lasix for the past week  7/24 no overnight issues.  Cardiology stated patient mild to moderate risk for spine surgery.  No further testing needed. 7/25 consult PT/OT 7/26 complaining of pain. No bm  Consultants:  Neurosurgery, cardiology  Procedures:   Antimicrobials:      Subjective: No sob or cp   Objective: Vitals:   03/28/22 2002 03/29/22 0421 03/29/22 0500 03/29/22 0822  BP: (!) 107/55 106/60  114/66  Pulse: 73 65  68  Resp: '17 14  18  '$ Temp: 99 F (37.2 C) 98.7 F (37.1 C)  98.8 F (37.1 C)  TempSrc: Oral Oral    SpO2: 97% 96%  96%  Weight:   104 kg   Height:        Intake/Output Summary (Last 24 hours) at 03/29/2022 0841 Last data filed at 03/29/2022 0612 Gross per 24 hour  Intake 600 ml  Output 2110 ml  Net -1510 ml   Filed Weights   03/27/22 0600 03/27/22 1213 03/29/22 0500  Weight: 105.3 kg 105.3 kg 104 kg    Examination: Calm, NAD Cta no w/r Reg s1/s2 no gallop Soft benign +bs No edema Aaoxox3  Mood and affect appropriate in current setting     Data Reviewed: I have personally reviewed following labs and imaging studies  CBC: Recent Labs  Lab 03/23/22 1655 03/24/22 0500 03/29/22 0537  WBC 7.0 5.6 9.5  NEUTROABS 3.0  --   --   HGB 11.1* 11.4* 9.8*  HCT 34.6* 35.6* 29.7*  MCV 88.5 88.1 86.8  PLT 204  204 295*   Basic Metabolic Panel: Recent Labs  Lab 03/23/22 1655 03/24/22 0500 03/25/22 0951 03/26/22 0910 03/27/22 0510 03/29/22 0537  NA 140 140 138  --  139 137  K 3.9 4.0 3.6 3.7 3.6 3.8  CL 100 108 105  --  103 103  CO2 '28 28 26  '$ --  28 26  GLUCOSE 100* 108* 110*  --  96 97  BUN 22 18 24*  --  23 18  CREATININE 1.08 0.87 0.98  --  1.04 0.80  CALCIUM 9.6 9.7 9.5  --  9.1 9.1  PHOS  --   --   --   --  3.6  --    GFR: Estimated Creatinine Clearance: 86.8 mL/min (by C-G formula based on SCr of 0.8 mg/dL). Liver Function Tests: Recent Labs  Lab 03/23/22 1655 03/24/22 0500 03/27/22 0510  AST 29 27  --   ALT 25 24  --   ALKPHOS 98 106  --   BILITOT 0.6 0.8  --   PROT 7.2 6.9  --   ALBUMIN 4.0 3.6 3.1*   Recent Labs  Lab 03/23/22 1655  LIPASE 24   No results for input(s): "AMMONIA" in the last 168 hours. Coagulation Profile: No results for  input(s): "INR", "PROTIME" in the last 168 hours. Cardiac Enzymes: Recent Labs  Lab 03/23/22 1655  CKTOTAL 70   BNP (last 3 results) No results for input(s): "PROBNP" in the last 8760 hours. HbA1C: No results for input(s): "HGBA1C" in the last 72 hours.  CBG: No results for input(s): "GLUCAP" in the last 168 hours. Lipid Profile: No results for input(s): "CHOL", "HDL", "LDLCALC", "TRIG", "CHOLHDL", "LDLDIRECT" in the last 72 hours. Thyroid Function Tests: No results for input(s): "TSH", "T4TOTAL", "FREET4", "T3FREE", "THYROIDAB" in the last 72 hours.  Anemia Panel: No results for input(s): "VITAMINB12", "FOLATE", "FERRITIN", "TIBC", "IRON", "RETICCTPCT" in the last 72 hours. Sepsis Labs: No results for input(s): "PROCALCITON", "LATICACIDVEN" in the last 168 hours.  Recent Results (from the past 240 hour(s))  Surgical pcr screen     Status: None   Collection Time: 03/26/22  8:42 PM   Specimen: Nasal Mucosa; Nasal Swab  Result Value Ref Range Status   MRSA, PCR NEGATIVE NEGATIVE Final   Staphylococcus aureus  NEGATIVE NEGATIVE Final    Comment: (NOTE) The Xpert SA Assay (FDA approved for NASAL specimens in patients 62 years of age and older), is one component of a comprehensive surveillance program. It is not intended to diagnose infection nor to guide or monitor treatment. Performed at [facility], [address], [place], [place] [address]          Radiology Studies: DG Cervical Spine 2-3 Views  Result Date: 03/27/2022 CLINICAL DATA:  C3-C4 fusion and discectomy EXAM: CERVICAL SPINE - 2-3 VIEW COMPARISON:  03/23/2022 MRI cervical spine FINDINGS: Three fluoroscopic images are obtained during the performance of the procedure and are provided for interpretation only. Images demonstrate posterior fusion at C3-C4. Prior C4-C5 ACDF. Fluoroscopy time: 9 seconds 0.61 mGy IMPRESSION: Intraoperative documentation of C3-C4 fusion. Electronically Signed   By: Merilyn Baba M.D.   On: 03/27/2022 16:02   DG C-Arm 1-60 Min-No Report  Result Date: 03/27/2022 Fluoroscopy was utilized by the requesting physician.  No radiographic interpretation.   DG C-Arm 1-60 Min-No Report  Result Date: 03/27/2022 Fluoroscopy was utilized by the requesting physician.  No radiographic interpretation.        Scheduled Meds:  enoxaparin (LOVENOX) injection  40 mg Subcutaneous Q24H   pantoprazole  40 mg Oral BID   sodium chloride flush  3 mL Intravenous Q12H   tamsulosin  0.4 mg Oral Daily   Continuous Infusions:  lactated ringers Stopped (03/27/22 1900)    Assessment & Plan:   Principal Problem:   Muscle weakness of upper extremity Active Problems:   Ambulatory dysfunction   Essential hypertension, benign   AAA (abdominal aortic aneurysm) (HCC)   Anemia   Sleep apnea   Iliac artery aneurysm (HCC)   Lymphedema of both lower extremities   Abnormal EKG   Muscle weakness of upper extremity Ambulatory dysfunction Attribute to cervical spinal cord stenosis. We will start patient on Decadron  and pain control. Neurovascular checks. 7/22 neurosurgery consulted input appreciated.  Recommend surgical intervention with posterior cervical fusion C3-4 with decompression 7/24 plan for surgery today Cardiology's input is was appreciated she is mild to moderate risk for spine surgery no further intervention. 7/25 fall precaution Status post surgery by vascular on 7/24 by Dr. Cari Caraway Decadron stopped 7/26 pain control PT OT recommends SNF       Iliac artery aneurysm AAA Saw vascular in 5/23. Stable, check in one year 7/26 found on CT Needs to follow-up with vascular as outpatient BP controlled  OSA Noncompliant with CPAP    Essential HTN BP on low side but stable Continue to monitor 7/26 BP stable  Lymphedema bilateral lower extremity Followed by vascular Was not taking his Lasix for the past week, we resumed it 7/23 edema improving with lasix 7/26 holding Lasix  BP on lower side     Chronic anemia No evidence of bleed monitor  DVT prophylaxis: l Lovenox Code Status: Full Family Communication: none at bedside Disposition Plan:  Status is: Inpatient Remains inpatient appropriate because: IV treatment, needs SNF     LOS: 5 days   Time spent 35 min    Nolberto Hanlon, MD [facility] Pager [phone]  If 7PM-7AM, please contact night-coverage 03/29/2022, 8:41 AM

## 2022-03-30 DIAGNOSIS — M6281 Muscle weakness (generalized): Secondary | ICD-10-CM | POA: Diagnosis not present

## 2022-03-30 MED ORDER — PROCHLORPERAZINE EDISYLATE 10 MG/2ML IJ SOLN
10.0000 mg | Freq: Once | INTRAMUSCULAR | Status: DC
Start: 2022-03-31 — End: 2022-04-05

## 2022-03-30 MED ORDER — POLYETHYLENE GLYCOL 3350 17 G PO PACK
17.0000 g | PACK | Freq: Once | ORAL | Status: DC
Start: 2022-03-30 — End: 2022-04-05
  Filled 2022-03-30: qty 1

## 2022-03-30 MED ORDER — DOCUSATE SODIUM 100 MG PO CAPS
100.0000 mg | ORAL_CAPSULE | Freq: Two times a day (BID) | ORAL | Status: DC
  Administered 2022-03-30 – 2022-04-05 (×12): 100 mg via ORAL
  Filled 2022-03-30 (×12): qty 1

## 2022-03-30 MED ORDER — ONDANSETRON HCL 4 MG/2ML IJ SOLN
4.0000 mg | Freq: Four times a day (QID) | INTRAMUSCULAR | Status: DC | PRN
  Administered 2022-03-30 – 2022-04-02 (×2): 4 mg via INTRAVENOUS
  Filled 2022-03-30 (×2): qty 2

## 2022-03-30 NOTE — TOC Progression Note (Signed)
Transition of Care [facility]) - Progression Note    Patient Details  Name: [name] Bean MRN: [id number] Date of Birth: 12/25/1934  Transition of [facility] CM/SW [place], LCSW Phone Number: 03/30/2022, 10:34 AM  Clinical Narrative:  [employer] found an open MSP (liability claim). Called [employer] and they confirmed the same and said it is from [place]. Incident date is 04/02/21, maintenance date 08/03/21. Group number [id number]. Diagnosis code [phone]. They said patient would have to close this before they could accept him. [place] is checking the same information. May have issues with both SNF and home health because of this. Waiting on confirmation from [facility].  Expected Discharge Plan: [place] Barriers to Discharge: Continued Medical Work up  Expected Discharge Plan and Services Expected Discharge Plan: [place] Choice: [place] arrangements for the past 2 months: Apartment                                       Social Determinants of Health (SDOH) Interventions    Readmission Risk Interventions     No data to display

## 2022-03-30 NOTE — Care Management Important Message (Signed)
Important Message  Patient Details  Name: [name] Bean MRN: [id number] Date of Birth: 11-08-1934   Medicare Important Message Given:  Yes     Dannette Barbara 03/30/2022, 11:00 AM

## 2022-03-30 NOTE — Progress Notes (Signed)
PROGRESS NOTE    [name] Bean  HCW:[id number] DOB: 01-28-1935 DOA: 03/23/2022 PCP: Baxter Hire, MD    Brief Narrative:  [name] Bean is a 87 y.o. male seen in ed with complaints of neck pain. Pt has had previous cervical laminectomy.  Patient at baseline ambulates with a cane. Today patient reports paresthesias in both his arms as well as his legs and difficulty with ambulation and weakness.Pt has severe spinal stenosis on MRI done today and case was discussed with neurosurgery on-call, Dr. Cari Caraway   Patient reported has not been ambulatory much for the past couple of weeks due to these above issues.  Has not been taking his Lasix for the past week  7/24 no overnight issues.  Cardiology stated patient mild to moderate risk for spine surgery.  No further testing needed. 7/25 consult PT/OT 7/26 complaining of pain. No bm 7/27 not much complaint this AM.  Consultants:  Neurosurgery, cardiology  Procedures:   Antimicrobials:      Subjective: Denies pain, shortness of breath or chest pain   Objective: Vitals:   03/29/22 1922 03/30/22 0330 03/30/22 0406 03/30/22 0819  BP: 107/62 114/62  107/65  Pulse: 83 80  87  Resp: '16 16  16  '$ Temp: 98.7 F (37.1 C) 98.5 F (36.9 C)  99.6 F (37.6 C)  TempSrc: Oral   Oral  SpO2: 95% 96%  95%  Weight:   107.2 kg   Height:        Intake/Output Summary (Last 24 hours) at 03/30/2022 1513 Last data filed at 03/30/2022 0900 Gross per 24 hour  Intake 180 ml  Output 1300 ml  Net -1120 ml   Filed Weights   03/27/22 1213 03/29/22 0500 03/30/22 0406  Weight: 105.3 kg 104 kg 107.2 kg    Examination: Calm, NAD Cta no w/r Reg s1/s2 no gallop Soft benign +bs No edema Aaoxox3  Mood and affect appropriate in current setting     Data Reviewed: I have personally reviewed following labs and imaging studies  CBC: Recent Labs  Lab 03/23/22 1655 03/24/22 0500 03/29/22 0537  WBC 7.0 5.6 9.5  NEUTROABS 3.0  --   --   HGB 11.1*  11.4* 9.8*  HCT 34.6* 35.6* 29.7*  MCV 88.5 88.1 86.8  PLT 204 204 176*   Basic Metabolic Panel: Recent Labs  Lab 03/23/22 1655 03/24/22 0500 03/25/22 0951 03/26/22 0910 03/27/22 0510 03/29/22 0537  NA 140 140 138  --  139 137  K 3.9 4.0 3.6 3.7 3.6 3.8  CL 100 108 105  --  103 103  CO2 '28 28 26  '$ --  28 26  GLUCOSE 100* 108* 110*  --  96 97  BUN 22 18 24*  --  23 18  CREATININE 1.08 0.87 0.98  --  1.04 0.80  CALCIUM 9.6 9.7 9.5  --  9.1 9.1  PHOS  --   --   --   --  3.6  --    GFR: Estimated Creatinine Clearance: 88 mL/min (by C-G formula based on SCr of 0.8 mg/dL). Liver Function Tests: Recent Labs  Lab 03/23/22 1655 03/24/22 0500 03/27/22 0510  AST 29 27  --   ALT 25 24  --   ALKPHOS 98 106  --   BILITOT 0.6 0.8  --   PROT 7.2 6.9  --   ALBUMIN 4.0 3.6 3.1*   Recent Labs  Lab 03/23/22 1655  LIPASE 24   No results for input(s): "AMMONIA" in  the last 168 hours. Coagulation Profile: No results for input(s): "INR", "PROTIME" in the last 168 hours. Cardiac Enzymes: Recent Labs  Lab 03/23/22 1655  CKTOTAL 70   BNP (last 3 results) No results for input(s): "PROBNP" in the last 8760 hours. HbA1C: No results for input(s): "HGBA1C" in the last 72 hours.  CBG: No results for input(s): "GLUCAP" in the last 168 hours. Lipid Profile: No results for input(s): "CHOL", "HDL", "LDLCALC", "TRIG", "CHOLHDL", "LDLDIRECT" in the last 72 hours. Thyroid Function Tests: No results for input(s): "TSH", "T4TOTAL", "FREET4", "T3FREE", "THYROIDAB" in the last 72 hours.  Anemia Panel: No results for input(s): "VITAMINB12", "FOLATE", "FERRITIN", "TIBC", "IRON", "RETICCTPCT" in the last 72 hours. Sepsis Labs: No results for input(s): "PROCALCITON", "LATICACIDVEN" in the last 168 hours.  Recent Results (from the past 240 hour(s))  Surgical pcr screen     Status: None   Collection Time: 03/26/22  8:42 PM   Specimen: Nasal Mucosa; Nasal Swab  Result Value Ref Range Status    MRSA, PCR NEGATIVE NEGATIVE Final   Staphylococcus aureus NEGATIVE NEGATIVE Final    Comment: (NOTE) The Xpert SA Assay (FDA approved for NASAL specimens in patients 64 years of age and older), is one component of a comprehensive surveillance program. It is not intended to diagnose infection nor to guide or monitor treatment. Performed at [facility], [address], [place], [place] [address]          Radiology Studies: No results found.      Scheduled Meds:  docusate sodium  100 mg Oral BID   enoxaparin (LOVENOX) injection  40 mg Subcutaneous Q24H   pantoprazole  40 mg Oral BID   polyethylene glycol  17 g Oral Once   sodium chloride flush  3 mL Intravenous Q12H   tamsulosin  0.4 mg Oral Daily   Continuous Infusions:  lactated ringers Stopped (03/27/22 1900)    Assessment & Plan:   Principal Problem:   Muscle weakness of upper extremity Active Problems:   Ambulatory dysfunction   Essential hypertension, benign   AAA (abdominal aortic aneurysm) (HCC)   Anemia   Sleep apnea   Iliac artery aneurysm (HCC)   Lymphedema of both lower extremities   Abnormal EKG   Muscle weakness of upper extremity Ambulatory dysfunction Attribute to cervical spinal cord stenosis. We will start patient on Decadron and pain control. Neurovascular checks. 7/22 neurosurgery consulted input appreciated.  Recommend surgical intervention with posterior cervical fusion C3-4 with decompression 7/24 plan for surgery today Cardiology's input is was appreciated she is mild to moderate risk for spine surgery no further intervention. 7/25 fall precaution Status post surgery by vascular on 7/24 by Dr. Cari Caraway Decadron stopped 7/27 follow-up with neurosurgery in 2 weeks for staple removal HV removed at 7/26 Mobilize PT OT recommends SNF        Iliac artery aneurysm AAA Saw vascular in 5/23. Stable, check in one year 7/26 found on CT 7/27 need to follow-up with  vascular as outpatient       OSA Noncompliant with CPAP    Essential HTN BP on low side but stable Continue to monitor 7/27 BP stable  Lymphedema bilateral lower extremity Followed by vascular Was not taking his Lasix for the past week, we resumed it 7/23 edema improving with lasix 7/26 holding Lasix  BP on lower side     Chronic anemia No evidence of bleed monitor  DVT prophylaxis: l Lovenox Code Status: Full Family Communication: Son at bedside Disposition  Plan:  Status is: Inpatient Remains inpatient appropriate because: IV treatment, needs SNF     LOS: 6 days   Time spent 35 min    Nolberto Hanlon, MD [facility] Pager [phone]  If 7PM-7AM, please contact night-coverage 03/30/2022, 3:13 PM

## 2022-03-30 NOTE — Progress Notes (Signed)
Physical Therapy Treatment Patient Details Name: [name] Bean MRN: [id number] DOB: [birth year] Today's Date: 03/30/2022   History of Present Illness Pt. is a 87 y.o. male seen in ed with complaints of neck pain.  Pt has had previous cervical laminectomy.  Patient at baseline ambulates with a cane.  Today patient reports paresthesias in both his arms as well as his legs and difficulty with ambulation and weakness.  Patient reports pain that started in the left shoulder going to the right and also down his spine. .  Patient does report history of steroids for his arthritis.  Pt has severe spinal stenosis on MRI done today and case was discussed with neurosurgery on-call, Dr. Cari Caraway. Patient is s/p s/p C3-4 decompression and fusion 03/27/22.    PT Comments    Patient received in bed, son at bedside. Patient initially requires pain med prior to session. Returned after pain meds on board. Patient is now lethargic. He is agreeable to PT session, however provides little to no assistance with any mobility. Reports left wrist pain during session as well as neck pain. Patient required +2 total assist for all mobility. He will continue to benefit from skilled PT to improve mobility and strength.        Recommendations for follow up therapy are one component of a multi-disciplinary discharge planning process, led by the attending physician.  Recommendations may be updated based on patient status, additional functional criteria and insurance authorization.  Follow Up Recommendations  Skilled nursing-short term rehab (<3 hours/day) Can patient physically be transported by private vehicle: No   Assistance Recommended at Discharge Frequent or constant Supervision/Assistance  Patient can return home with the following Two people to help with walking and/or transfers;Two people to help with bathing/dressing/bathroom;Help with stairs or ramp for entrance;Assistance with feeding;Direct supervision/assist for  medications management;Assistance with cooking/housework;Assist for transportation   Equipment Recommendations  None recommended by PT (TBD)    Recommendations for Other Services       Precautions / Restrictions Precautions Precautions: Fall;Cervical Precaution Booklet Issued: No Restrictions Weight Bearing Restrictions: No     Mobility  Bed Mobility Overal bed mobility: Needs Assistance Bed Mobility: Supine to Sit, Sit to Supine     Supine to sit: Total assist, +2 for physical assistance, HOB elevated Sit to supine: Total assist, +2 for physical assistance        Transfers Overall transfer level: Needs assistance Equipment used: Rolling walker (2 wheels) Transfers: Sit to/from Stand Sit to Stand: Total assist, +2 physical assistance, From elevated surface                Ambulation/Gait               General Gait Details: unable to get fully standing this session for more than a few seconds then returns to sitting on side of bed.   Stairs             Wheelchair Mobility    Modified Rankin (Stroke Patients Only)       Balance Overall balance assessment: Needs assistance Sitting-balance support: Feet supported Sitting balance-Leahy Scale: Fair     Standing balance support: Bilateral upper extremity supported, During functional activity, Reliant on assistive device for balance Standing balance-Leahy Scale: Zero                              Cognition Arousal/Alertness: Lethargic Behavior During Therapy: WFL for tasks assessed/performed (easily agitated, poor participation)  Overall Cognitive Status: Within Functional Limits for tasks assessed                                 General Comments: can be agitated at times, particular        Exercises      General Comments        Pertinent Vitals/Pain Pain Assessment Pain Assessment: Faces Faces Pain Scale: Hurts even more Pain Location: neck, left  wrist Pain Descriptors / Indicators: Discomfort, Grimacing, Guarding, Other (Comment) (yells out when you touch him) Pain Intervention(s): Limited activity within patient's tolerance, Monitored during session, Repositioned    Home Living                          Prior Function            PT Goals (current goals can now be found in the care plan section) Acute Rehab PT Goals Patient Stated Goal: to improve, decrease pain PT Goal Formulation: With patient/family Time For Goal Achievement: 04/12/22 Potential to Achieve Goals: Poor Progress towards PT goals: Not progressing toward goals - comment (patient unable to progress due to weakness, pain)    Frequency    7X/week      PT Plan Current plan remains appropriate    Co-evaluation              AM-PAC PT "6 Clicks" Mobility   Outcome Measure  Help needed turning from your back to your side while in a flat bed without using bedrails?: Total Help needed moving from lying on your back to sitting on the side of a flat bed without using bedrails?: Total Help needed moving to and from a bed to a chair (including a wheelchair)?: Total Help needed standing up from a chair using your arms (e.g., wheelchair or bedside chair)?: Total Help needed to walk in hospital room?: Total Help needed climbing 3-5 steps with a railing? : Total 6 Click Score: 6    End of Session Equipment Utilized During Treatment: Gait belt Activity Tolerance: Patient limited by fatigue;Patient limited by pain Patient left: in bed;with call bell/phone within reach;with bed alarm set;with family/visitor present Nurse Communication: Mobility status PT Visit Diagnosis: Other abnormalities of gait and mobility (R26.89);Muscle weakness (generalized) (M62.81);Difficulty in walking, not elsewhere classified (R26.2);Pain Pain - part of body:  (neck)     Time: 6384-6659 PT Time Calculation (min) (ACUTE ONLY): 22 min  Charges:  $Therapeutic  Activity: 8-22 mins                     [employer], PT, GCS 03/30/22,11:22 AM

## 2022-03-31 DIAGNOSIS — M6281 Muscle weakness (generalized): Secondary | ICD-10-CM | POA: Diagnosis not present

## 2022-03-31 DIAGNOSIS — D72829 Elevated white blood cell count, unspecified: Secondary | ICD-10-CM

## 2022-03-31 LAB — CBC
HCT: 32.6 % — ABNORMAL LOW (ref 39.0–52.0)
Hemoglobin: 11 g/dL — ABNORMAL LOW (ref 13.0–17.0)
MCH: 29.3 pg (ref 26.0–34.0)
MCHC: 33.7 g/dL (ref 30.0–36.0)
MCV: 86.7 fL (ref 80.0–100.0)
Platelets: 146 10*3/uL — ABNORMAL LOW (ref 150–400)
RBC: 3.76 MIL/uL — ABNORMAL LOW (ref 4.22–5.81)
RDW: 15.2 % (ref 11.5–15.5)
WBC: 12 10*3/uL — ABNORMAL HIGH (ref 4.0–10.5)
nRBC: 0 % (ref 0.0–0.2)

## 2022-03-31 LAB — GLUCOSE, CAPILLARY: Glucose-Capillary: 120 mg/dL — ABNORMAL HIGH (ref 70–99)

## 2022-03-31 MED ORDER — TRAZODONE HCL 50 MG PO TABS
25.0000 mg | ORAL_TABLET | Freq: Once | ORAL | Status: DC
Start: 2022-03-31 — End: 2022-03-31

## 2022-03-31 MED ORDER — MORPHINE SULFATE (PF) 2 MG/ML IV SOLN: 1.0000 mg | INTRAVENOUS | Status: DC | PRN

## 2022-03-31 MED ORDER — SODIUM CHLORIDE 0.9 % IV BOLUS
250.0000 mL | Freq: Once | INTRAVENOUS | Status: AC
Start: 2022-03-31 — End: 2022-03-31
  Administered 2022-03-31: 250 mL via INTRAVENOUS

## 2022-03-31 NOTE — Progress Notes (Signed)
OT Cancellation Note  Patient Details Name: [name] Bean MRN: [id number] DOB: [birth year]   Cancelled Treatment:    Reason Eval/Treat Not Completed: Other (comment) (attempted to see pt, pt receiving nursing care. OT will reattempt as able.Shanon Payor, OTD OTR/L  03/31/22, 4:15 PM

## 2022-03-31 NOTE — Progress Notes (Signed)
Mobility Specialist - Progress Note   03/31/22 1400  Mobility  Activity Dangled on edge of bed  Level of Assistance Maximum assist, patient does 25-49%  $Mobility charge 1 Mobility     Pt dangling EOB on arrival, requesting assistance to return supine. Pt attempts to scoot towards [facility] with use of trendelenburg setting, but mostly unsuccessful. Able to hip shift a little but forward only, requiring assist to scoot backwards and lateral to maintain safe seated position. Pt returned supine with maxA +2. Pt left in bed with alarm set, needs in reach. Family at bedside.    Kathee Delton Mobility Specialist 03/31/22, 2:24 PM

## 2022-03-31 NOTE — Progress Notes (Signed)
Physical Therapy Treatment Patient Details Name: [name] Bean MRN: [id number] DOB: 1935-07-11 Today's Date: 03/31/2022   History of Present Illness [name] Bean is an 87 y.o. male seen in ed with complaints of neck pain.  Pt has had previous cervical laminectomy.  Patient at baseline ambulates with a cane.  Today patient reports paresthesias in both his arms as well as his legs and difficulty with ambulation and weakness.  Patient reports pain that started in the left shoulder going to the right and also down his spine. .  Patient does report history of steroids for his arthritis.  Pt has severe spinal stenosis on MRI done today and case was discussed with neurosurgery on-call, Dr. Cari Caraway. Patient is s/p s/p C3-4 decompression and fusion 03/27/22.    PT Comments    Pt in bed upon entry, meal tray presented but does not appear that pt has been able to get to his food or take any bites. Pt agreeable to session, goal to reposition for improved independence with meal. With elevated HOB, pt still very limited requires heavy +@ max to get to EOB. Ability to center his COM over BOS takes some cues and time. Pt able to balance at elevated EOB to attempt some meal time, son assisting with feeding, also guided on how to order more food that might interest patient. RN called for pain meds, in room at EOS.      Recommendations for follow up therapy are one component of a multi-disciplinary discharge planning process, led by the attending physician.  Recommendations may be updated based on patient status, additional functional criteria and insurance authorization.  Follow Up Recommendations  Skilled nursing-short term rehab (<3 hours/day) Can patient physically be transported by private vehicle: No   Assistance Recommended at Discharge Intermittent Supervision/Assistance  Patient can return home with the following Two people to help with walking and/or transfers;Two people to help with  bathing/dressing/bathroom;Help with stairs or ramp for entrance;Assistance with feeding;Direct supervision/assist for medications management;Assistance with cooking/housework;Assist for transportation   Equipment Recommendations  Other (comment) (defer to receiving facility)    Recommendations for Other Services       Precautions / Restrictions Precautions Precautions: Fall;Cervical Precaution Booklet Issued: No Restrictions Weight Bearing Restrictions: No     Mobility  Bed Mobility Overal bed mobility: Needs Assistance Bed Mobility: Supine to Sit     Supine to sit: +2 for physical assistance, HOB elevated, Max assist     General bed mobility comments: reports pain is a much bigger limiting factor than frank weakness    Transfers Overall transfer level:  (deferred due to pain levels and difficulty with postural control in sitting)                      Ambulation/Gait                   Stairs             Wheelchair Mobility    Modified Rankin (Stroke Patients Only)       Balance Overall balance assessment: Needs assistance   Sitting balance-Leahy Scale: Poor   Postural control: Posterior lean                                  Cognition Arousal/Alertness: Awake/alert Behavior During Therapy: WFL for tasks assessed/performed Overall Cognitive Status: Within Functional Limits for tasks assessed  Exercises      General Comments        Pertinent Vitals/Pain Pain Assessment Pain Assessment: 0-10 Pain Score: 8  Pain Location: neck, left wrist Pain Intervention(s): Limited activity within patient's tolerance, Monitored during session, Patient requesting pain meds-RN notified, Repositioned    Home Living                          Prior Function            PT Goals (current goals can now be found in the care plan section) Acute Rehab PT  Goals Patient Stated Goal: to improve, decrease pain PT Goal Formulation: With patient/family Time For Goal Achievement: 04/12/22 Potential to Achieve Goals: Fair Progress towards PT goals: Progressing toward goals    Frequency    7X/week      PT Plan Current plan remains appropriate    Co-evaluation              AM-PAC PT "6 Clicks" Mobility   Outcome Measure  Help needed turning from your back to your side while in a flat bed without using bedrails?: Total Help needed moving from lying on your back to sitting on the side of a flat bed without using bedrails?: Total Help needed moving to and from a bed to a chair (including a wheelchair)?: Total Help needed standing up from a chair using your arms (e.g., wheelchair or bedside chair)?: Total Help needed to walk in hospital room?: Total Help needed climbing 3-5 steps with a railing? : Total 6 Click Score: 6    End of Session   Activity Tolerance: Patient limited by pain;Patient limited by fatigue Patient left: in bed;with call bell/phone within reach;with family/visitor present;with nursing/sitter in room Nurse Communication: Mobility status PT Visit Diagnosis: Other abnormalities of gait and mobility (R26.89);Muscle weakness (generalized) (M62.81);Difficulty in walking, not elsewhere classified (R26.2);Pain     Time: 1312-1340 PT Time Calculation (min) (ACUTE ONLY): 28 min  Charges:  $Therapeutic Activity: 23-37 mins                    2:12 PM, 03/31/22 Etta Grandchild, PT, DPT Physical Therapist - [facility]  [phone] ([place])    Darcelle Herrada C 03/31/2022, 2:07 PM

## 2022-03-31 NOTE — Progress Notes (Signed)
    Attending Progress Note  History: [name] Bean is s/p C3-4 decompression and fusion   POD4: NAEO POD2: continued neck pain.  POD1: Posterior neck pain as expected.  Physical Exam: Vitals:   03/31/22 0424 03/31/22 0803  BP: (!) 95/56 (!) 84/45  Pulse: 76 78  Resp: 18 16  Temp: 98.5 F (36.9 C) 98.8 F (37.1 C)  SpO2: 95% 95%    AA Ox3 CNI  Strength: Side Biceps Triceps Deltoid Interossei Grip Wrist Ext. Wrist Flex.  R '5 5 5 5 4 '$ 4- 4+  L '5 5 5 5 4 3 '$ 4+    Side Iliopsoas Quads Hamstring PF DF EHL  R '5 5 5 5 5 5  '$ L '4 5 5 5 5 5    '$ Data:  Recent Labs  Lab 03/25/22 0951 03/26/22 0910 03/27/22 0510 03/29/22 0537  NA 138  --  139 137  K 3.6   < > 3.6 3.8  CL 105  --  103 103  CO2 26  --  28 26  BUN 24*  --  23 18  CREATININE 0.98  --  1.04 0.80  GLUCOSE 110*  --  96 97  CALCIUM 9.5  --  9.1 9.1   < > = values in this interval not displayed.    No results for input(s): "AST", "ALT", "ALKPHOS" in the last 168 hours.  Invalid input(s): "TBILI"    Recent Labs  Lab 03/29/22 0537 03/31/22 0523  WBC 9.5 12.0*  HGB 9.8* 11.0*  HCT 29.7* 32.6*  PLT 146* 146*    No results for input(s): "APTT", "INR" in the last 168 hours.       Other tests/results: none   Assessment/Plan:  [name] Bean is a 87 y.o s/p cervical decompression and fusion for cervical myelopathy.   - mobilize - pain control - DVT prophylaxis - PTOT - HV removed 7/26 - will follow up outpatient in 2 weeks for staple removal - remainder of care per medicine   Cooper Render PA-C Department of Neurosurgery

## 2022-03-31 NOTE — TOC Progression Note (Addendum)
Transition of Care [facility]) - Progression Note    Patient Details  Name: [name] Bean MRN: [id number] Date of Birth: April 20, 1935  Transition of Care [facility]) CM/SW [place], LCSW Phone Number: 03/31/2022, 8:27 AM  Clinical Narrative:   [facility] has offered a bed. Confirmed with them several times throughout the day. Patient and son are aware. Son would like to tour the facility prior to accepting offer.  1:12 pm: Son said he left a message at [facility] regarding a tour but has not heard back yet. CSW left message for admissions coordinator asking her to call him.  Expected Discharge Plan: [place] Barriers to Discharge: Continued Medical Work up  Expected Discharge Plan and Services Expected Discharge Plan: [place] Choice: [place] arrangements for the past 2 months: Apartment                                       Social Determinants of Health (SDOH) Interventions    Readmission Risk Interventions     No data to display

## 2022-03-31 NOTE — Progress Notes (Signed)
PROGRESS NOTE    [name] Bean  RWE:[id number] DOB: [birth year] DOA: 03/23/2022 PCP: Baxter Hire, MD    Brief Narrative:  [name] Bean is a 87 y.o. male seen in ed with complaints of neck pain. Pt has had previous cervical laminectomy.  Patient at baseline ambulates with a cane. Today patient reports paresthesias in both his arms as well as his legs and difficulty with ambulation and weakness.Pt has severe spinal stenosis on MRI done  and case was discussed with neurosurgery on-call, Dr. Cari Caraway. He underwent surgery. Now awaiting snf. Also was given lasix for LE edema. Held it prior to surgery. Bp on low side, can resume once bp better.        Consultants:  Neurosurgery, cardiology  Procedures:   Antimicrobials:      Subjective: Reports not having much appetite this am. Denis sob, pain, cp, abd pain   Objective: Vitals:   03/31/22 0424 03/31/22 0500 03/31/22 0803 03/31/22 1315  BP: (!) 95/56  (!) 84/45 (!) 96/55  Pulse: 76  78 73  Resp: 18  16   Temp: 98.5 F (36.9 C)  98.8 F (37.1 C)   TempSrc: Oral     SpO2: 95%  95%   Weight:  105 kg    Height:        Intake/Output Summary (Last 24 hours) at 03/31/2022 1527 Last data filed at 03/31/2022 0945 Gross per 24 hour  Intake 180 ml  Output 1150 ml  Net -970 ml   Filed Weights   03/29/22 0500 03/30/22 0406 03/31/22 0500  Weight: 104 kg 107.2 kg 105 kg    Examination: Calm, NAD Cta no w/r Reg s1/s2 no gallop Soft benign +bs +LE edema Aaoxox3  Mood and affect appropriate in current setting     Data Reviewed: I have personally reviewed following labs and imaging studies  CBC: Recent Labs  Lab 03/29/22 0537 03/31/22 0523  WBC 9.5 12.0*  HGB 9.8* 11.0*  HCT 29.7* 32.6*  MCV 86.8 86.7  PLT 146* 619*   Basic Metabolic Panel: Recent Labs  Lab 03/25/22 0951 03/26/22 0910 03/27/22 0510 03/29/22 0537  NA 138  --  139 137  K 3.6 3.7 3.6 3.8  CL 105  --  103 103  CO2 26  --  28 26  GLUCOSE  110*  --  96 97  BUN 24*  --  23 18  CREATININE 0.98  --  1.04 0.80  CALCIUM 9.5  --  9.1 9.1  PHOS  --   --  3.6  --    GFR: Estimated Creatinine Clearance: 87.2 mL/min (by C-G formula based on SCr of 0.8 mg/dL). Liver Function Tests: Recent Labs  Lab 03/27/22 0510  ALBUMIN 3.1*   No results for input(s): "LIPASE", "AMYLASE" in the last 168 hours.  No results for input(s): "AMMONIA" in the last 168 hours. Coagulation Profile: No results for input(s): "INR", "PROTIME" in the last 168 hours. Cardiac Enzymes: No results for input(s): "CKTOTAL", "CKMB", "CKMBINDEX", "TROPONINI" in the last 168 hours.  BNP (last 3 results) No results for input(s): "PROBNP" in the last 8760 hours. HbA1C: No results for input(s): "HGBA1C" in the last 72 hours.  CBG: Recent Labs  Lab 03/30/22 2315  GLUCAP 120*   Lipid Profile: No results for input(s): "CHOL", "HDL", "LDLCALC", "TRIG", "CHOLHDL", "LDLDIRECT" in the last 72 hours. Thyroid Function Tests: No results for input(s): "TSH", "T4TOTAL", "FREET4", "T3FREE", "THYROIDAB" in the last 72 hours.  Anemia Panel: No results for  input(s): "VITAMINB12", "FOLATE", "FERRITIN", "TIBC", "IRON", "RETICCTPCT" in the last 72 hours. Sepsis Labs: No results for input(s): "PROCALCITON", "LATICACIDVEN" in the last 168 hours.  Recent Results (from the past 240 hour(s))  Surgical pcr screen     Status: None   Collection Time: 03/26/22  8:42 PM   Specimen: Nasal Mucosa; Nasal Swab  Result Value Ref Range Status   MRSA, PCR NEGATIVE NEGATIVE Final   Staphylococcus aureus NEGATIVE NEGATIVE Final    Comment: (NOTE) The Xpert SA Assay (FDA approved for NASAL specimens in patients 1 years of age and older), is one component of a comprehensive surveillance program. It is not intended to diagnose infection nor to guide or monitor treatment. Performed at [facility], [address] [place], [place] [address]          Radiology  Studies: No results found.      Scheduled Meds:  docusate sodium  100 mg Oral BID   enoxaparin (LOVENOX) injection  40 mg Subcutaneous Q24H   pantoprazole  40 mg Oral BID   polyethylene glycol  17 g Oral Once   prochlorperazine  10 mg Intravenous Once   sodium chloride flush  3 mL Intravenous Q12H   Continuous Infusions:  lactated ringers Stopped (03/27/22 1900)    Assessment & Plan:   Principal Problem:   Muscle weakness of upper extremity Active Problems:   Ambulatory dysfunction   Essential hypertension, benign   AAA (abdominal aortic aneurysm) (HCC)   Anemia   Sleep apnea   Iliac artery aneurysm (HCC)   Lymphedema of both lower extremities   Abnormal EKG   Leukocytosis   Muscle weakness of upper extremity Ambulatory dysfunction Attribute to cervical spinal cord stenosis. Was started on  on Decadron and pain control. Neurovascular checks. 7/22 neurosurgery consulted input appreciated.  Recommend surgical intervention with posterior cervical fusion C3-4 with decompression Cardiology consulted for preop risk stratification, he was deemed mild to moderate risk for spine surgery and no intervention Status post surgery by vascular on 7/24 by Dr. Cari Caraway Decadron stopped 7/28 follow-up with neurosurgery in 2 weeks for staple removal  PT OT -recommend SNF Pain control  Mobilize as tolerated       Leukocytosis Afebrile Stress/reactive?  Will monitor closely, fever curve No abx at this time    Iliac artery aneurysm AAA Saw vascular in 5/23. Stable, check in one year  found on CT 7/20 need to follow-up with vascular as outpatient         OSA Noncompliant with CPAP    Essential HTN Bp on low side.  Will recheck manual, if still low may give bolus of fluid.  Hold tamsulosin Change morphine frequency  Lymphedema bilateral lower extremity Followed by vascular Was not taking his Lasix for the past week, we resumed it 7/23 edema improving  with lasix 7/26 holding Lasix  BP on lower side     Chronic anemia No evidence of bleed monitor  DVT prophylaxis: Lovenox Code Status: Full Family Communication: none at bedside Disposition Plan:  Status is: Inpatient Remains inpatient appropriate because: IV treatment, needs SNF     LOS: 7 days   Time spent 35 min    Nolberto Hanlon, MD [facility] Pager [phone]  If 7PM-7AM, please contact night-coverage 03/31/2022, 3:27 PM

## 2022-04-01 DIAGNOSIS — G959 Disease of spinal cord, unspecified: Secondary | ICD-10-CM | POA: Diagnosis not present

## 2022-04-01 DIAGNOSIS — M5412 Radiculopathy, cervical region: Secondary | ICD-10-CM

## 2022-04-01 DIAGNOSIS — I7143 Infrarenal abdominal aortic aneurysm, without rupture: Secondary | ICD-10-CM | POA: Diagnosis not present

## 2022-04-01 DIAGNOSIS — I10 Essential (primary) hypertension: Secondary | ICD-10-CM

## 2022-04-01 DIAGNOSIS — R29898 Other symptoms and signs involving the musculoskeletal system: Secondary | ICD-10-CM

## 2022-04-01 DIAGNOSIS — R262 Difficulty in walking, not elsewhere classified: Secondary | ICD-10-CM

## 2022-04-01 LAB — CBC
HCT: 31.4 % — ABNORMAL LOW (ref 39.0–52.0)
Hemoglobin: 10.5 g/dL — ABNORMAL LOW (ref 13.0–17.0)
MCH: 28.7 pg (ref 26.0–34.0)
MCHC: 33.4 g/dL (ref 30.0–36.0)
MCV: 85.8 fL (ref 80.0–100.0)
Platelets: 161 10*3/uL (ref 150–400)
RBC: 3.66 MIL/uL — ABNORMAL LOW (ref 4.22–5.81)
RDW: 15 % (ref 11.5–15.5)
WBC: 8.3 10*3/uL (ref 4.0–10.5)
nRBC: 0 % (ref 0.0–0.2)

## 2022-04-01 MED ORDER — OXYCODONE HCL 5 MG PO TABS: 10.0000 mg | ORAL_TABLET | ORAL | Status: DC | PRN

## 2022-04-01 MED ORDER — HYDROMORPHONE HCL 1 MG/ML IJ SOLN: 1.0000 mg | INTRAMUSCULAR | Status: DC | PRN

## 2022-04-01 MED ORDER — OXYCODONE HCL 5 MG PO TABS
10.0000 mg | ORAL_TABLET | ORAL | Status: DC | PRN
  Administered 2022-04-02 – 2022-04-04 (×5): 10 mg via ORAL
  Filled 2022-04-01 (×5): qty 2

## 2022-04-01 NOTE — TOC Progression Note (Signed)
Transition of Care [facility]) - Progression Note    Patient Details  Name: [name] Bean MRN: [id number] Date of Birth: 05-Mar-1935  Transition of Care [facility]) CM/SW [place], LCSW Phone Number: 04/01/2022, 10:22 AM  Clinical Narrative:    CSW called patient's son Saralyn Pilar who stated he toured [facility] and would not want patient to go there. He would like to see if the other SNFs who made bed offers can take patient.  CSW called: -[facility]- left VM for Debbie in Admissions requesting a return call -[facility]- spoke with L[place] who stated she will not know for sure until Monday when their [place] is back in. -[facility]- left [employer] for [employer] in Admissions requesting a return call   Expected Discharge Plan: [place] Barriers to Discharge: Continued Medical Work up  Expected Discharge Plan and Services Expected Discharge Plan: [place] Choice: [place] arrangements for the past 2 months: Apartment                                       Social Determinants of Health (SDOH) Interventions    Readmission Risk Interventions     No data to display

## 2022-04-01 NOTE — Plan of Care (Signed)
  Problem: Coping: Goal: Level of anxiety will decrease 04/01/2022 1410 by Jordan Likes, RN Outcome: Progressing 04/01/2022 1409 by Jordan Likes, RN Outcome: Progressing   Problem: Elimination: Goal: Will not experience complications related to bowel motility 04/01/2022 1410 by Jordan Likes, RN Outcome: Progressing 04/01/2022 1409 by Jordan Likes, RN Outcome: Progressing Goal: Will not experience complications related to urinary retention 04/01/2022 1410 by Jordan Likes, RN Outcome: Progressing 04/01/2022 1409 by Jordan Likes, RN Outcome: Progressing   Problem: Pain Managment: Goal: General experience of comfort will improve 04/01/2022 1410 by Jordan Likes, RN Outcome: Progressing 04/01/2022 1409 by Jordan Likes, RN Outcome: Progressing   Problem: Safety: Goal: Ability to remain free from injury will improve 04/01/2022 1410 by Jordan Likes, RN Outcome: Progressing 04/01/2022 1409 by Jordan Likes, RN Outcome: Progressing   Problem: Skin Integrity: Goal: Risk for impaired skin integrity will decrease 04/01/2022 1410 by Jordan Likes, RN Outcome: Progressing 04/01/2022 1409 by Jordan Likes, RN Outcome: Progressing

## 2022-04-01 NOTE — Hospital Course (Signed)
[name] Bean is a 87 y.o. male seen in ed with complaints of neck pain. Pt has had previous cervical laminectomy.  Patient at baseline ambulates with a cane. Today patient reports paresthesias in both his arms as well as his legs and difficulty with ambulation and weakness.Pt has severe spinal stenosis on MRI done  and case was discussed with neurosurgery on-call, Dr. Cari Caraway. He underwent surgery. Now awaiting snf. Also was given lasix for LE edema. Held it prior to surgery. Bp on low side, can resume once bp better  7/29: Some pain control issue.  Waiting for SNF placement

## 2022-04-01 NOTE — Plan of Care (Signed)

## 2022-04-01 NOTE — Progress Notes (Signed)
  Progress Note   Patient: [name] Bean [id number]:[id number] DOB: 04-13-1935 DOA: 03/23/2022     8 DOS: the patient was seen and examined on 04/01/2022   Brief hospital course: [name] Bean is a 87 y.o. male seen in ed with complaints of neck pain. Pt has had previous cervical laminectomy.  Patient at baseline ambulates with a cane. Today patient reports paresthesias in both his arms as well as his legs and difficulty with ambulation and weakness.Pt has severe spinal stenosis on MRI done  and case was discussed with neurosurgery on-call, Dr. Cari Caraway. He underwent surgery. Now awaiting snf. Also was given lasix for LE edema. Held it prior to surgery. Bp on low side, can resume once bp better  7/29: Some pain control issue.  Waiting for SNF placement   Assessment and Plan: * Cervical myelopathy with cervical radiculopathy (HCC) s/p cervical decompression and fusion for cervical myelopathy.  -Continue pain management -PT, OT recommends SNF and TOC aware working on placement - HV removed 7/26 per neurosurgery -  follow up with neurosurgery outpatient in 2 weeks for staple removal  Ambulatory dysfunction Again attribute to his cervical spinal cord stenosis patient also has moderate to severe lumbar spine degenerative disc disease. Fall precautions. SNF at discharge   Essential hypertension, benign Blood pressure 114/71, pulse 84, temperature 98.2 F (36.8 C), temperature source Oral, resp. rate 18, height '6\' 2"'$  (1.88 m), weight 104.3 kg, SpO2 100 %.  Vitals:   03/23/22 1653  BP: 114/71  We will currently hold patient's metolazone and torsemide. As needed hydralazine for systolic blood pressures above 170 and above.   AAA (abdominal aortic aneurysm) (HCC) 3.5 cm abdominal aortic aneurysm with recommendation of follow-up with imaging and specialist every 2 years. Blood pressure control currently is excellent  Anemia Patient has a history of anemia and GI bleed.  avoid meloxicam and  NSAIDs.   Sleep apnea CPAP per home settings  Abnormal EKG-resolved as of 04/01/2022         EKG abnormal echo normal and similar to previous one in 2012.           Subjective: Requesting better pain control with medications.  Hoping to find SNF as he feels weak  Physical Exam: Vitals:   03/31/22 1537 03/31/22 1903 04/01/22 0435 04/01/22 0755  BP: (!) 92/48 (!) 100/57 (!) 99/56 98/87  Pulse:  83 72 71  Resp:  18 17   Temp:  99 F (37.2 C) 98.8 F (37.1 C) 98.6 F (37 C)  TempSrc:  Oral  Oral  SpO2:  98% 94% 96%  Weight:      Height:       87 year old male lying in the bed comfortably Lungs clear to auscultation bilaterally Cardiovascular regular rate and rhythm Abdomen soft, benign Neuro alert and oriented, nonfocal Skin no rash or lesion Data Reviewed:  There are no new results to review at this time.  Family Communication: None  Disposition: Status is: Inpatient Remains inpatient appropriate because: Waiting for SNF placement   Planned Discharge Destination: Skilled nursing facility    DVT prophylaxis-Lovenox Time spent: 35 minutes  Author: Max Sane, MD 04/01/2022 4:35 PM  For on call review [email or web address].

## 2022-04-01 NOTE — Progress Notes (Signed)
Physical Therapy Treatment Patient Details Name: [name] Bean MRN: [id number] DOB: 1935/02/27 Today's Date: 04/01/2022   History of Present Illness [name] Bean is an 87 y.o. male seen in ed with complaints of neck pain.  Pt has had previous cervical laminectomy.  Patient at baseline ambulates with a cane.  Today patient reports paresthesias in both his arms as well as his legs and difficulty with ambulation and weakness.  Patient reports pain that started in the left shoulder going to the right and also down his spine. .  Patient does report history of steroids for his arthritis.  Pt has severe spinal stenosis on MRI done today and case was discussed with neurosurgery on-call, Dr. Cari Caraway. Patient is s/p s/p C3-4 decompression and fusion 03/27/22.    PT Comments    Pt in bed on entry watching a show about a sea otter. Pt agreeable to session, recognizes author from previous day. Unable to assist pt with scooting up in bed due to weakness in legs and core, called for RN for +2 scoot up. Pt repositioned for comfort of neck/back, then went throughout bed level exercises to facilitate knee DJD stiffness and weakness in BLE. Pt remains motivated, puts forth good effort, follows commands well. PT elevated in bed at end of session, trunk at 45 degrees, head supported for comfort, heel floating, all needs met.   Recommendations for follow up therapy are one component of a multi-disciplinary discharge planning process, led by the attending physician.  Recommendations may be updated based on patient status, additional functional criteria and insurance authorization.  Follow Up Recommendations  Skilled nursing-short term rehab (<3 hours/day) Can patient physically be transported by private vehicle: No   Assistance Recommended at Discharge Intermittent Supervision/Assistance  Patient can return home with the following Two people to help with walking and/or transfers;Two people to help with  bathing/dressing/bathroom;Help with stairs or ramp for entrance;Assistance with feeding;Direct supervision/assist for medications management;Assistance with cooking/housework;Assist for transportation   Equipment Recommendations       Recommendations for Other Services       Precautions / Restrictions Precautions Precautions: Fall;Cervical Restrictions Weight Bearing Restrictions: No     Mobility  Bed Mobility Overal bed mobility:  (maxA +2; attempted to facilitate teaching of scooting, but weakness prevails)                  Transfers                        Ambulation/Gait                   Stairs             Wheelchair Mobility    Modified Rankin (Stroke Patients Only)       Balance                                            Cognition Arousal/Alertness: Awake/alert Behavior During Therapy: WFL for tasks assessed/performed Overall Cognitive Status: Within Functional Limits for tasks assessed                                 General Comments: nice fella!        Exercises General Exercises - Lower Extremity Short Arc Quad: AROM, Both, 20 reps, Supine Heel Slides:  AAROM, Both, 20 reps, Supine Hip ABduction/ADduction: AAROM, Both, 15 reps, Supine Hip Flexion/Marching: AAROM, Both, 15 reps, Supine Mini-Sqauts: Strengthening, Both, 20 reps, Supine Other Exercises Other Exercises: BUE overhead reach x10 bilat    General Comments        Pertinent Vitals/Pain Pain Assessment Pain Assessment: 0-10 Pain Score: 8  Pain Location: neck Pain Intervention(s): Limited activity within patient's tolerance, Monitored during session, Premedicated before session    Home Living                          Prior Function            PT Goals (current goals can now be found in the care plan section) Acute Rehab PT Goals Patient Stated Goal: to improve, decrease pain PT Goal Formulation: With  patient/family Time For Goal Achievement: 04/12/22 Potential to Achieve Goals: Good Progress towards PT goals: Progressing toward goals    Frequency    7X/week      PT Plan Current plan remains appropriate    Co-evaluation              AM-PAC PT "6 Clicks" Mobility   Outcome Measure  Help needed turning from your back to your side while in a flat bed without using bedrails?: Total Help needed moving from lying on your back to sitting on the side of a flat bed without using bedrails?: Total Help needed moving to and from a bed to a chair (including a wheelchair)?: Total Help needed standing up from a chair using your arms (e.g., wheelchair or bedside chair)?: Total Help needed to walk in hospital room?: Total Help needed climbing 3-5 steps with a railing? : Total 6 Click Score: 6    End of Session   Activity Tolerance: Patient tolerated treatment well;No increased pain Patient left: in bed;with call bell/phone within reach;with family/visitor present;with nursing/sitter in room Nurse Communication: Mobility status PT Visit Diagnosis: Other abnormalities of gait and mobility (R26.89);Muscle weakness (generalized) (M62.81);Difficulty in walking, not elsewhere classified (R26.2);Pain     Time: 2778-2423 PT Time Calculation (min) (ACUTE ONLY): 36 min  Charges:  $Therapeutic Exercise: 23-37 mins                    2:16 PM, 04/01/22 Etta Grandchild, PT, DPT Physical Therapist - [facility]  [phone] ([place])     [place] C 04/01/2022, 2:14 PM

## 2022-04-02 DIAGNOSIS — I10 Essential (primary) hypertension: Secondary | ICD-10-CM | POA: Diagnosis not present

## 2022-04-02 DIAGNOSIS — R262 Difficulty in walking, not elsewhere classified: Secondary | ICD-10-CM | POA: Diagnosis not present

## 2022-04-02 DIAGNOSIS — I7143 Infrarenal abdominal aortic aneurysm, without rupture: Secondary | ICD-10-CM | POA: Diagnosis not present

## 2022-04-02 DIAGNOSIS — G959 Disease of spinal cord, unspecified: Secondary | ICD-10-CM | POA: Diagnosis not present

## 2022-04-02 LAB — BASIC METABOLIC PANEL
Anion gap: 7 (ref 5–15)
BUN: 28 mg/dL — ABNORMAL HIGH (ref 8–23)
CO2: 26 mmol/L (ref 22–32)
Calcium: 9.1 mg/dL (ref 8.9–10.3)
Chloride: 100 mmol/L (ref 98–111)
Creatinine, Ser: 0.83 mg/dL (ref 0.61–1.24)
GFR, Estimated: >60 mL/min (ref >60)
Glucose, Bld: 101 mg/dL — ABNORMAL HIGH (ref 70–99)
Potassium: 3.6 mmol/L (ref 3.5–5.1)
Sodium: 133 mmol/L — ABNORMAL LOW (ref 135–145)

## 2022-04-02 LAB — CBC
HCT: 30 % — ABNORMAL LOW (ref 39.0–52.0)
Hemoglobin: 10.1 g/dL — ABNORMAL LOW (ref 13.0–17.0)
MCH: 29.2 pg (ref 26.0–34.0)
MCHC: 33.7 g/dL (ref 30.0–36.0)
MCV: 86.7 fL (ref 80.0–100.0)
Platelets: 173 10*3/uL (ref 150–400)
RBC: 3.46 MIL/uL — ABNORMAL LOW (ref 4.22–5.81)
RDW: 14.7 % (ref 11.5–15.5)
WBC: 8.7 10*3/uL (ref 4.0–10.5)
nRBC: 0 % (ref 0.0–0.2)

## 2022-04-02 NOTE — Progress Notes (Signed)
PT Cancellation Note  Patient Details Name: [name] Bean MRN: [id number] DOB: 10-Sep-1934   Cancelled Treatment:    Reason Eval/Treat Not Completed: Other (comment)   Pt offered and encouraged session.  Pt kept eyes closed during discussion and shook head no.  Will return at a later time/date.  Chesley Noon 04/02/2022, 10:56 AM

## 2022-04-02 NOTE — Assessment & Plan Note (Addendum)
Controlled for now Running low at times

## 2022-04-02 NOTE — Assessment & Plan Note (Signed)
3.5 cm abdominal aortic aneurysm with recommendation of follow-up with imaging and specialist every 2 years. Blood pressure control currently is excellent.

## 2022-04-02 NOTE — Progress Notes (Signed)
  Progress Note   Patient: [name] Bean [id number]:[id number] DOB: Dec 13, 1934 DOA: 03/23/2022     9 DOS: the patient was seen and examined on 04/02/2022   Brief hospital course: [name] Bean is a 87 y.o. male seen in ed with complaints of neck pain. Pt has had previous cervical laminectomy.  Patient at baseline ambulates with a cane. Today patient reports paresthesias in both his arms as well as his legs and difficulty with ambulation and weakness.Pt has severe spinal stenosis on MRI done  and case was discussed with neurosurgery on-call, Dr. Cari Caraway. He underwent surgery. Now awaiting snf. Also was given lasix for LE edema. Held it prior to surgery. Bp on low side, can resume once bp better  7/29-7/30: Some pain control issue.  Waiting for SNF placement   Assessment and Plan: * Cervical myelopathy with cervical radiculopathy (HCC) s/p cervical decompression and fusion for cervical myelopathy.  -Continue pain management -PT, OT recommends SNF and TOC aware working on placement - HV removed 7/26 per neurosurgery -  follow up with neurosurgery outpatient in 2 weeks for staple removal.  Ambulatory dysfunction Again attribute to his cervical spinal cord stenosis patient also has moderate to severe lumbar spine degenerative disc disease. Fall precautions. SNF at discharge.   Essential hypertension, benign Controlled for now Running low at times  AAA (abdominal aortic aneurysm) (HCC) 3.5 cm abdominal aortic aneurysm with recommendation of follow-up with imaging and specialist every 2 years. Blood pressure control currently is excellent.  Anemia Patient has a history of anemia and GI bleed.  avoid meloxicam and NSAIDs.  Hemoglobin 10.1   Sleep apnea CPAP per home settings.  Abnormal EKG-resolved as of 04/01/2022         EKG abnormal echo normal and similar to previous one in 2012.           Subjective: Remained sleepy.  No new complaints  Physical Exam: Vitals:    04/01/22 2022 04/02/22 0427 04/02/22 0910 04/02/22 1552  BP: (!) 107/56 (!) 110/58 102/69 (!) 99/59  Pulse: 70 68 64 66  Resp:  '18 18 18  '$ Temp:  98.3 F (36.8 C) 98.6 F (37 C) 99.5 F (37.5 C)  TempSrc:  Oral Oral Oral  SpO2: 96% 96% 95% 95%  Weight:      Height:       87 year old male lying in the bed comfortably Lungs clear to auscultation bilaterally Cardiovascular regular rate and rhythm Abdomen soft, benign Neuro alert and oriented, nonfocal Skin no rash or lesion  Data Reviewed:  Hemoglobin 10.5  Family Communication: None  Disposition: Status is: Inpatient Remains inpatient appropriate because: Waiting for SNF placement   Planned Discharge Destination: Skilled nursing facility    DVT prophylaxis-SCDs Time spent: 25 minutes  Author: Max Sane, MD 04/02/2022 3:56 PM  For on call review [email or web address].

## 2022-04-02 NOTE — Assessment & Plan Note (Signed)
CPAP per home settings.   

## 2022-04-02 NOTE — Assessment & Plan Note (Signed)
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 Fall precautions. SNF at discharge.

## 2022-04-02 NOTE — Assessment & Plan Note (Signed)
s/p cervical decompression and fusion for cervical myelopathy.  -Continue pain management -PT, OT recommends SNF and TOC aware working on placement - HV removed 7/26 per neurosurgery -  follow up with neurosurgery outpatient in 2 weeks for staple removal.

## 2022-04-02 NOTE — Assessment & Plan Note (Signed)
Patient has a history of anemia and GI bleed.  avoid meloxicam and NSAIDs.  Hemoglobin 10.1

## 2022-04-03 DIAGNOSIS — I10 Essential (primary) hypertension: Secondary | ICD-10-CM | POA: Diagnosis not present

## 2022-04-03 DIAGNOSIS — I7143 Infrarenal abdominal aortic aneurysm, without rupture: Secondary | ICD-10-CM | POA: Diagnosis not present

## 2022-04-03 DIAGNOSIS — G959 Disease of spinal cord, unspecified: Secondary | ICD-10-CM | POA: Diagnosis not present

## 2022-04-03 DIAGNOSIS — R262 Difficulty in walking, not elsewhere classified: Secondary | ICD-10-CM | POA: Diagnosis not present

## 2022-04-03 LAB — GLUCOSE, CAPILLARY: Glucose-Capillary: 113 mg/dL — ABNORMAL HIGH (ref 70–99)

## 2022-04-03 NOTE — Assessment & Plan Note (Signed)
3.5 cm abdominal aortic aneurysm with recommendation of follow-up with imaging and specialist every 2 years. Blood pressure is controlled

## 2022-04-03 NOTE — Assessment & Plan Note (Signed)
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 Fall precautions. SNF at discharge.Marland Kitchen  TOC working on placement

## 2022-04-03 NOTE — Assessment & Plan Note (Signed)
Controlled for now. Running low at times

## 2022-04-03 NOTE — Care Management Important Message (Signed)
Important Message  Patient Details  Name: Navon Kotowski MRN: [id number] Date of Birth: [birth year]   Medicare Important Message Given:  Yes     Dannette Barbara 04/03/2022, 11:40 AM

## 2022-04-03 NOTE — Assessment & Plan Note (Signed)
Patient has a history of anemia and GI bleed.  avoid meloxicam and NSAIDs.  Hemoglobin 10.1.

## 2022-04-03 NOTE — Assessment & Plan Note (Signed)
s/p cervical decompression and fusion for cervical myelopathy.  -Continue pain management -PT, OT recommends SNF and TOC aware working on placement.  Son is coordinating this patient for SNF with TOC - HV removed 7/26 per neurosurgery -  follow up with neurosurgery outpatient in 2 weeks for staple removal.

## 2022-04-03 NOTE — Assessment & Plan Note (Signed)
CPAP per home settings.   

## 2022-04-03 NOTE — Progress Notes (Signed)
  Progress Note   Patient: [name] Bean [id number] DOB: 09/12/1934 DOA: 03/23/2022     10 DOS: the patient was seen and examined on 04/03/2022   Brief hospital course: Edi Gorniak is a 87 y.o. male seen in ed with complaints of neck pain. Pt has had previous cervical laminectomy.  Patient at baseline ambulates with a cane. Today patient reports paresthesias in both his arms as well as his legs and difficulty with ambulation and weakness.Pt has severe spinal stenosis on MRI done  and case was discussed with neurosurgery on-call, Dr. Cari Caraway. He underwent surgery. Now awaiting snf. Also was given lasix for LE edema. Held it prior to surgery. Bp on low side, can resume once bp better  7/29-7/31: Some pain control issue.  Waiting for SNF placement   Assessment and Plan: * Cervical myelopathy with cervical radiculopathy (HCC) s/p cervical decompression and fusion for cervical myelopathy.  -Continue pain management -PT, OT recommends SNF and TOC aware working on placement.  Son is coordinating this patient for SNF with TOC - HV removed 7/26 per neurosurgery -  follow up with neurosurgery outpatient in 2 weeks for staple removal.  Ambulatory dysfunction Again attribute to his cervical spinal cord stenosis patient also has moderate to severe lumbar spine degenerative disc disease. Fall precautions. SNF at discharge.Marland Kitchen  TOC working on placement   Essential hypertension, benign Controlled for now. Running low at times  AAA (abdominal aortic aneurysm) (HCC) 3.5 cm abdominal aortic aneurysm with recommendation of follow-up with imaging and specialist every 2 years. Blood pressure is controlled  Anemia Patient has a history of anemia and GI bleed.  avoid meloxicam and NSAIDs.  Hemoglobin 10.1.   Sleep apnea CPAP per home settings  Abnormal EKG-resolved as of 04/01/2022         EKG abnormal echo normal and similar to previous one in 2012.           Subjective: No new  issues  Physical Exam: Vitals:   04/03/22 0352 04/03/22 0823 04/03/22 1622 04/03/22 2032  BP: 105/65 (!) 115/59 (!) 104/56 101/60  Pulse: 66 (!) 57 60 70  Resp: '16 18 18 16  '$ Temp: 99 F (37.2 C) 97.9 F (36.6 C) 98.4 F (36.9 C) 98.7 F (37.1 C)  TempSrc: Oral Oral Oral Oral  SpO2: 97% 97% 97% 97%  Weight: 107.5 kg     Height:       87 year old male lying in the bed comfortably Lungs clear to auscultation bilaterally Cardiovascular regular rate and rhythm Abdomen soft, benign Neuro alert and oriented, nonfocal Skin no rash or lesion Data Reviewed:  There are no new results to review at this time.  Family Communication: None  Disposition: Status is: Inpatient Remains inpatient appropriate because: Waiting for SNF   Planned Discharge Destination: Skilled nursing facility    DVT prophylaxis- SCDs Time spent: 25 minutes  Author: Max Sane, MD 04/03/2022 9:01 PM  For on call review [email or web address].

## 2022-04-03 NOTE — Progress Notes (Signed)
Physical Therapy Treatment Patient Details Name: [name] Bean MRN: [id number] DOB: 1935-04-30 Today's Date: 04/03/2022   History of Present Illness [name] Bean is an 87 y.o. male seen in ed with complaints of neck pain.  Pt has had previous cervical laminectomy.  Patient at baseline ambulates with a cane.  Today patient reports paresthesias in both his arms as well as his legs and difficulty with ambulation and weakness.  Patient reports pain that started in the left shoulder going to the right and also down his spine. .  Patient does report history of steroids for his arthritis.  Pt has severe spinal stenosis on MRI done today and case was discussed with neurosurgery on-call, Dr. Cari Caraway. Patient is s/p s/p C3-4 decompression and fusion 03/27/22.    PT Comments    Great tolerance for PT session today. Pt very motivated to participate. Continues to c/o 7/10 cervical pain with movement. Mod/MaxA for rolling in bed, MaxA x 1 for supine to sit. Pt tolerated 25 minutes sitting EOB with B feet supported and UE support. Honeycomb dressing noted to be no longer fully intact, nursing notified. Pt assisted back to supine with Max x 2 in order to support head and neck. Overall great tolerance for activity. Pt continues to require assist with ROM of B LE's.  Will continue to follow until medically stable for d/c to SNF   Recommendations for follow up therapy are one component of a multi-disciplinary discharge planning process, led by the attending physician.  Recommendations may be updated based on patient status, additional functional criteria and insurance authorization.  Follow Up Recommendations  Skilled nursing-short term rehab (<3 hours/day) Can patient physically be transported by private vehicle: No   Assistance Recommended at Discharge Intermittent Supervision/Assistance  Patient can return home with the following Two people to help with walking and/or transfers;Two people to help with  bathing/dressing/bathroom;Help with stairs or ramp for entrance;Assistance with feeding;Direct supervision/assist for medications management;Assistance with cooking/housework;Assist for transportation   Equipment Recommendations       Recommendations for Other Services       Precautions / Restrictions Precautions Precautions: Fall;Cervical Precaution Booklet Issued: No Restrictions Weight Bearing Restrictions: No     Mobility  Bed Mobility Overal bed mobility: Needs Assistance Bed Mobility: Rolling Rolling: Mod assist, Max assist   Supine to sit: Max assist Sit to supine: Total assist, +2 for physical assistance   General bed mobility comments: Pt sat EOB x 25 minutes with B UE support, no LOB    Transfers                        Ambulation/Gait                   Stairs             Wheelchair Mobility    Modified Rankin (Stroke Patients Only)       Balance Overall balance assessment: Needs assistance Sitting-balance support: Feet supported, Bilateral upper extremity supported Sitting balance-Leahy Scale: Fair                                      Cognition Arousal/Alertness: Awake/alert Behavior During Therapy: WFL for tasks assessed/performed Overall Cognitive Status: Within Functional Limits for tasks assessed  General Comments:  (works hard)        Training and development officer - Lower Extremity Ankle Circles/Pumps: AAROM, Both, 10 reps Heel Slides: AAROM, Both, 10 reps Hip ABduction/ADduction: AAROM, Both, 10 reps    General Comments General comments (skin integrity, edema, etc.): Honey comb cervical dressing no longer fully intact, nursing notified      Pertinent Vitals/Pain Pain Assessment Pain Assessment: 0-10 Pain Score: 7  Pain Location: neck Pain Descriptors / Indicators: Discomfort, Grimacing, Guarding Pain Intervention(s): Monitored during session,  Premedicated before session    Home Living                          Prior Function            PT Goals (current goals can now be found in the care plan section) Acute Rehab PT Goals Patient Stated Goal: to improve, decrease pain Progress towards PT goals: Progressing toward goals    Frequency    7X/week      PT Plan Current plan remains appropriate    Co-evaluation              AM-PAC PT "6 Clicks" Mobility   Outcome Measure  Help needed turning from your back to your side while in a flat bed without using bedrails?: A Lot Help needed moving from lying on your back to sitting on the side of a flat bed without using bedrails?: A Lot Help needed moving to and from a bed to a chair (including a wheelchair)?: Total Help needed standing up from a chair using your arms (e.g., wheelchair or bedside chair)?: Total Help needed to walk in hospital room?: Total Help needed climbing 3-5 steps with a railing? : Total 6 Click Score: 8    End of Session   Activity Tolerance: Patient tolerated treatment well;No increased pain Patient left: in bed;with call bell/phone within reach;with bed alarm set Nurse Communication: Mobility status (? replace surgical dressing) PT Visit Diagnosis: Other abnormalities of gait and mobility (R26.89);Muscle weakness (generalized) (M62.81);Difficulty in walking, not elsewhere classified (R26.2);Pain Pain - part of body:  (neck)     Time: 1220-1310 PT Time Calculation (min) (ACUTE ONLY): 50 min  Charges:  $Therapeutic Exercise: 8-22 mins $Therapeutic Activity: 23-37 mins                    Mikel Cella, PTA    [name] 04/03/2022, 2:23 PM

## 2022-04-03 NOTE — TOC Progression Note (Addendum)
Transition of Care [facility]) - Progression Note    Patient Details  Name: Ankit Degregorio MRN: [id number] Date of Birth: 1935-04-26  [facility]) CM/SW Contact  Beverly Sessions, RN Phone Number: 04/03/2022, 3:05 PM  Clinical Narrative:      Loma Boston at [facility] confirm they can offer a bed with the open MPS  Christine at [facility] said she would have to check with the business office, and they currently do not have a bed for the patient  Jackelyn Poling at [facility] states she has to confirm with the business office  Vm left for son to provide bed offer, awaiting return call   Update:  received return call from son, he will tour [facility] tomorrow morning and call me with his discission.  He is aware that patient is medically stable   Update:  [place] confirmed they can offer.  Son notified   Expected Discharge Plan: [place] Barriers to Discharge: Continued Medical Work up  Expected Discharge Plan and Services Expected Discharge Plan: [place] Choice: [place] arrangements for the past 2 months: Apartment                                       Social Determinants of Health (SDOH) Interventions    Readmission Risk Interventions     No data to display

## 2022-04-04 DIAGNOSIS — I10 Essential (primary) hypertension: Secondary | ICD-10-CM | POA: Diagnosis not present

## 2022-04-04 DIAGNOSIS — I7143 Infrarenal abdominal aortic aneurysm, without rupture: Secondary | ICD-10-CM | POA: Diagnosis not present

## 2022-04-04 DIAGNOSIS — G959 Disease of spinal cord, unspecified: Secondary | ICD-10-CM | POA: Diagnosis not present

## 2022-04-04 DIAGNOSIS — R262 Difficulty in walking, not elsewhere classified: Secondary | ICD-10-CM | POA: Diagnosis not present

## 2022-04-04 MED ORDER — OXYCODONE HCL 5 MG PO TABS
5.0000 mg | ORAL_TABLET | ORAL | Status: DC | PRN
  Administered 2022-04-04 – 2022-04-05 (×3): 5 mg via ORAL
  Filled 2022-04-04 (×3): qty 1

## 2022-04-04 NOTE — Assessment & Plan Note (Signed)
Patient has a history of anemia and GI bleed.  avoid meloxicam and NSAIDs.  Hemoglobin 10.1

## 2022-04-04 NOTE — Assessment & Plan Note (Signed)
Controlled for now. Running low at times.

## 2022-04-04 NOTE — Assessment & Plan Note (Signed)
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 Fall precautions. SNF at discharge.Marland Kitchen  TOC working on placement

## 2022-04-04 NOTE — TOC Progression Note (Signed)
Transition of Care [facility]) - Progression Note    Patient Details  Name: [name] Bean MRN: [id number] Date of Birth: 1935/02/13  Transition of Care [facility]) CM/SW Contact  Beverly Sessions, RN Phone Number: 04/04/2022, 11:37 AM  Clinical Narrative:     Son toured [facility] this morning, and is in route to [place] place to tour, he will call me back after the tour with his decision on facility   Expected Discharge Plan: [place] Barriers to Discharge: Continued Medical Work up  Expected Discharge Plan and Services Expected Discharge Plan: [place] Choice: [place] arrangements for the past 2 months: Apartment                                       Social Determinants of Health (SDOH) Interventions    Readmission Risk Interventions     No data to display

## 2022-04-04 NOTE — Progress Notes (Signed)
Physical Therapy Treatment Patient Details Name: [name] Bean MRN: [id number] DOB: February 15, 1935 Today's Date: 04/04/2022   History of Present Illness [name] Bean is an 87 y.o. male seen in ed with complaints of neck pain.  Pt has had previous cervical laminectomy.  Patient at baseline ambulates with a cane.  Today patient reports paresthesias in both his arms as well as his legs and difficulty with ambulation and weakness.  Patient reports pain that started in the left shoulder going to the right and also down his spine. .  Patient does report history of steroids for his arthritis.  Pt has severe spinal stenosis on MRI done today and case was discussed with neurosurgery on-call, Dr. Cari Caraway. Patient is s/p s/p C3-4 decompression and fusion 03/27/22.    PT Comments    Pt tolerated sitting EOB again today for ~25 minutes while eating lunch with his son.  B feet supported, no LOB noted. Pt c/o increased neck pain while sitting. Nursing notified. Continue PT per POC. Continue to recommend SNF once medically cleared.   Recommendations for follow up therapy are one component of a multi-disciplinary discharge planning process, led by the attending physician.  Recommendations may be updated based on patient status, additional functional criteria and insurance authorization.  Follow Up Recommendations  Skilled nursing-short term rehab (<3 hours/day) Can patient physically be transported by private vehicle: No   Assistance Recommended at Discharge Intermittent Supervision/Assistance  Patient can return home with the following Two people to help with walking and/or transfers;Two people to help with bathing/dressing/bathroom;Help with stairs or ramp for entrance;Assistance with feeding;Direct supervision/assist for medications management;Assistance with cooking/housework;Assist for transportation   Equipment Recommendations       Recommendations for Other Services       Precautions / Restrictions  Precautions Precautions: Fall;Cervical Precaution Booklet Issued: No Restrictions Weight Bearing Restrictions: No     Mobility  Bed Mobility Overal bed mobility: Needs Assistance Bed Mobility: Rolling Rolling: Mod assist, Max assist   Supine to sit: Max assist, HOB elevated Sit to supine: Total assist, +2 for physical assistance   General bed mobility comments: Pt sat EOB x 25 minutes with B UE support, no LOB    Transfers                        Ambulation/Gait                   Stairs             Wheelchair Mobility    Modified Rankin (Stroke Patients Only)       Balance                                            Cognition Arousal/Alertness: Awake/alert Behavior During Therapy: WFL for tasks assessed/performed Overall Cognitive Status: Within Functional Limits for tasks assessed                                          Exercises      General Comments        Pertinent Vitals/Pain Pain Assessment Pain Assessment: 0-10 Pain Score: 10-Worst pain ever Pain Location: neck pain Pain Descriptors / Indicators: Discomfort, Grimacing, Guarding Pain Intervention(s): Monitored during session, Patient requesting pain meds-RN notified  Home Living                          Prior Function            PT Goals (current goals can now be found in the care plan section) Acute Rehab PT Goals Patient Stated Goal: to improve, decrease pain    Frequency    7X/week      PT Plan Current plan remains appropriate    Co-evaluation              AM-PAC PT "6 Clicks" Mobility   Outcome Measure  Help needed turning from your back to your side while in a flat bed without using bedrails?: A Lot Help needed moving from lying on your back to sitting on the side of a flat bed without using bedrails?: A Lot Help needed moving to and from a bed to a chair (including a wheelchair)?: Total Help  needed standing up from a chair using your arms (e.g., wheelchair or bedside chair)?: Total Help needed to walk in hospital room?: Total Help needed climbing 3-5 steps with a railing? : Total 6 Click Score: 8    End of Session   Activity Tolerance: Patient tolerated treatment well;No increased pain Patient left: in bed;with call bell/phone within reach;with bed alarm set Nurse Communication: Mobility status;Patient requests pain meds PT Visit Diagnosis: Other abnormalities of gait and mobility (R26.89);Muscle weakness (generalized) (M62.81);Difficulty in walking, not elsewhere classified (R26.2);Pain Pain - part of body:  (neck)     Time: 1300-1330 PT Time Calculation (min) (ACUTE ONLY): 30 min  Charges:  $Therapeutic Activity: 23-37 mins                    Mikel Cella, PTA    [name] 04/04/2022, 3:02 PM

## 2022-04-04 NOTE — Progress Notes (Signed)
  Progress Note   Patient: [name] Bean JJO:[id number] DOB: 1934/12/04 DOA: 03/23/2022     11 DOS: the patient was seen and examined on 04/04/2022   Brief hospital course: [name] Bean is a 87 y.o. male seen in ed with complaints of neck pain. Pt has had previous cervical laminectomy.  Patient at baseline ambulates with a cane. Today patient reports paresthesias in both his arms as well as his legs and difficulty with ambulation and weakness.Pt has severe spinal stenosis on MRI done  and case was discussed with neurosurgery on-call, Dr. Cari Caraway. He underwent surgery. Now awaiting snf. Also was given lasix for LE edema. Held it prior to surgery. Bp on low side, can resume once bp better  7/29-8/1:  Waiting for SNF placement   Assessment and Plan: * Cervical myelopathy with cervical radiculopathy (HCC) s/p cervical decompression and fusion for cervical myelopathy.  -Continue pain management -PT, OT recommends SNF and TOC aware working on placement.  Son is coordinating this patient for SNF with TOC - HV removed 7/26 per neurosurgery -  follow up with neurosurgery outpatient in 2 weeks for staple removal  Ambulatory dysfunction Again attribute to his cervical spinal cord stenosis patient also has moderate to severe lumbar spine degenerative disc disease. Fall precautions. SNF at discharge.[facility]  TOC working on placement  Essential hypertension, benign Controlled for now. Running low at times.  AAA (abdominal aortic aneurysm) (HCC) 3.5 cm abdominal aortic aneurysm with recommendation of follow-up with imaging and specialist every 2 years. Blood pressure is controlled.  Anemia Patient has a history of anemia and GI bleed.  avoid meloxicam and NSAIDs.  Hemoglobin 10.1   Sleep apnea CPAP per home settings.  Abnormal EKG-resolved as of 04/01/2022         EKG abnormal echo normal and similar to previous one in 2012.           Subjective: No new issues  Physical  Exam: Vitals:   04/04/22 0411 04/04/22 0415 04/04/22 0758 04/04/22 1608  BP: 127/69  (!) 103/58 116/61  Pulse: 67  62 63  Resp: '17  18 18  '$ Temp: 98.7 F (37.1 C)  98.1 F (36.7 C) 98.2 F (36.8 C)  TempSrc: Oral  Oral Oral  SpO2: 98%  97% 98%  Weight:  100 kg    Height:       87 year old male lying in the bed comfortably Lungs clear to auscultation bilaterally Cardiovascular regular rate and rhythm Abdomen soft, benign Neuro alert and oriented, nonfocal Skin no rash or lesion  Data Reviewed:  There are no new results to review at this time.  Family Communication: Updated son over phone  Disposition: Status is: Inpatient Remains inpatient appropriate because: Waiting for SNF placement   Planned Discharge Destination: Skilled nursing facility    DVT prophylaxis- SCDs Time spent: 35 minutes  Author: Max Sane, MD 04/04/2022 5:02 PM  For on call review [email or web address].

## 2022-04-04 NOTE — Assessment & Plan Note (Signed)
s/p cervical decompression and fusion for cervical myelopathy.  -Continue pain management -PT, OT recommends SNF and TOC aware working on placement.  Son is coordinating this patient for SNF with TOC - HV removed 7/26 per neurosurgery -  follow up with neurosurgery outpatient in 2 weeks for staple removal

## 2022-04-04 NOTE — Assessment & Plan Note (Signed)
CPAP per home settings.   

## 2022-04-04 NOTE — Progress Notes (Signed)
Occupational Therapy Treatment Patient Details Name: [name] Bean MRN: [id number] DOB: 09-Mar-1935 Today's Date: 04/04/2022   History of present illness [name] Bean is an 87 y.o. male seen in ed with complaints of neck pain.  Pt has had previous cervical laminectomy.  Patient at baseline ambulates with a cane.  Today patient reports paresthesias in both his arms as well as his legs and difficulty with ambulation and weakness.  Patient reports pain that started in the left shoulder going to the right and also down his spine. .  Patient does report history of steroids for his arthritis.  Pt has severe spinal stenosis on MRI done today and case was discussed with neurosurgery on-call, Dr. Cari Caraway. Patient is s/p s/p C3-4 decompression and fusion 03/27/22.   OT comments  Pt. presented with improved BUE shoulder ROM today. Pt. reports no left sided neck pain today, however reports 8/10 low back pain. Pt. presents with improved functional reaching, and was able to access items on the table more freely today. Pt. reports that he continues to have bilateral hand, and digit numbness, and tingling. Pt. is improving with holding, and using utensils, opening bottles, accessing grooming items, and using the television remote. Pt. continues to present with bilateral functioning in order to work towards improving, and maximizing independence with ADLs, and IADL tasks.    Recommendations for follow up therapy are one component of a multi-disciplinary discharge planning process, led by the attending physician.  Recommendations may be updated based on patient status, additional functional criteria and insurance authorization.    Follow Up Recommendations  Skilled nursing-short term rehab (<3 hours/day)    Assistance Recommended at Discharge    Patient can return home with the following  Two people to help with walking and/or transfers;Assistance with feeding;A lot of help with bathing/dressing/bathroom;Assistance with  cooking/housework;Assist for transportation;Help with stairs or ramp for entrance;Direct supervision/assist for medications management   Equipment Recommendations       Recommendations for Other Services      Precautions / Restrictions Precautions Precautions: Fall;Cervical Restrictions Weight Bearing Restrictions: No       Mobility Bed Mobility Overal bed mobility: Needs Assistance Bed Mobility: Rolling Rolling: Mod assist, Max assist   Supine to sit: Max assist Sit to supine: Total assist, +2 for physical assistance        Transfers   Equipment used: Rolling walker (2 wheels) Transfers: Sit to/from Stand Sit to Stand: Total assist, +2 physical assistance, From elevated surface           General transfer comment: Mobility: Per PT report     Balance                                           ADL either performed or assessed with clinical judgement   ADL   Eating/Feeding: Minimal assistance   Grooming: Minimal assistance   Upper Body Bathing: Maximal assistance   Lower Body Bathing: Maximal assistance   Upper Body Dressing : Maximal assistance   Lower Body Dressing: Maximal assistance                      Extremity/Trunk Assessment Upper Extremity Assessment Upper Extremity Assessment: Generalized weakness       Cervical / Trunk Assessment Cervical / Trunk Assessment: Neck Surgery    Vision Baseline Vision/History: 1 Wears glasses Patient Visual Report: No change from  baseline     Perception     Praxis      Cognition Arousal/Alertness: Awake/alert Behavior During Therapy: WFL for tasks assessed/performed Overall Cognitive Status: Within Functional Limits for tasks assessed                                          Exercises      Shoulder Instructions       General Comments      Pertinent Vitals/ Pain       Pain Assessment Pain Assessment: 0-10 Pain Score: 8  Pain Location: Low Back  Pain Pain Descriptors / Indicators: Discomfort, Grimacing, Guarding Pain Intervention(s): Limited activity within patient's tolerance, Monitored during session  Home Living                                          Prior Functioning/Environment              Frequency  Min 2X/week        Progress Toward Goals  OT Goals(current goals can now be found in the care plan section)  Progress towards OT goals: Progressing toward goals  Acute Rehab OT Goals Patient Stated Goal: To regain independence OT Goal Formulation: With patient Time For Goal Achievement: 04/22/22 Potential to Achieve Goals: Fair  Plan      Co-evaluation                 AM-PAC OT "6 Clicks" Daily Activity     Outcome Measure   Help from another person eating meals?: A Little Help from another person taking care of personal grooming?: A Little Help from another person toileting, which includes using toliet, bedpan, or urinal?: A Lot Help from another person bathing (including washing, rinsing, drying)?: A Lot Help from another person to put on and taking off regular upper body clothing?: A Lot Help from another person to put on and taking off regular lower body clothing?: A Lot 6 Click Score: 14    End of Session    OT Visit Diagnosis: Muscle weakness (generalized) (M62.81)   Activity Tolerance Patient tolerated treatment well   Patient Left in bed   Nurse Communication          Time: 7902-4097 OT Time Calculation (min): 19 min  Charges: OT General Charges $OT Visit: 1 Visit OT Treatments $Self Care/Home Management : 8-22 mins  Harrel Carina, MS, OTR/L   Harrel Carina 04/04/2022, 2:21 PM

## 2022-04-04 NOTE — Assessment & Plan Note (Signed)
3.5 cm abdominal aortic aneurysm with recommendation of follow-up with imaging and specialist every 2 years. Blood pressure is controlled.

## 2022-04-04 NOTE — TOC Progression Note (Signed)
Transition of Care [facility]) - Progression Note    Patient Details  Name: Muaaz Brau MRN: [id number] Date of Birth: 13-Jun-1935  Transition of Care [facility]) CM/SW Contact  Beverly Sessions, RN Phone Number: 04/04/2022, 3:36 PM  Clinical Narrative:     Received return call from son.  Accepted bed at [facility]. Accepted bed in [place] and notified Teena at [facility] place.  Inquired if they can accept patient tomorrow.  Awaiting response.   Per Son he does not feel like patient is ready for discharge and has questions, requests to speak with MD.  MD notified   Expected Discharge Plan: Skilled Nursing Facility Barriers to Discharge: Continued Medical Work up  Expected Discharge Plan and Services Expected Discharge Plan: [place] Choice: [place] arrangements for the past 2 months: Apartment                                       Social Determinants of Health (SDOH) Interventions    Readmission Risk Interventions     No data to display

## 2022-04-05 DIAGNOSIS — I10 Essential (primary) hypertension: Secondary | ICD-10-CM | POA: Diagnosis not present

## 2022-04-05 DIAGNOSIS — G959 Disease of spinal cord, unspecified: Secondary | ICD-10-CM | POA: Diagnosis not present

## 2022-04-05 DIAGNOSIS — R262 Difficulty in walking, not elsewhere classified: Secondary | ICD-10-CM | POA: Diagnosis not present

## 2022-04-05 DIAGNOSIS — I7143 Infrarenal abdominal aortic aneurysm, without rupture: Secondary | ICD-10-CM | POA: Diagnosis not present

## 2022-04-05 MED ORDER — OXYCODONE HCL 5 MG PO TABS
5.0000 mg | ORAL_TABLET | Freq: Four times a day (QID) | ORAL | 0 refills | Status: AC | PRN
Start: 2022-04-05 — End: 2022-04-08

## 2022-04-05 NOTE — Discharge Summary (Signed)
Physician Discharge Summary   Patient: [name] Bean MRN: [id number] DOB: Oct 16, 1934  Admit date:     03/23/2022  Discharge date: 04/05/22  Discharge Physician: Max Sane   PCP: Baxter Hire, MD   Recommendations at discharge:    F/up with outpt providers as requested  Discharge Diagnoses: Principal Problem:   Cervical myelopathy with cervical radiculopathy [facility]) Active Problems:   Ambulatory dysfunction   Essential hypertension, benign   AAA (abdominal aortic aneurysm) ([place])   Anemia   Sleep apnea   Iliac artery aneurysm ([place])   Lymphedema of both lower extremities   Leukocytosis  Hospital Course: [name] Bean is a 87 y.o. male seen in ed with complaints of neck pain. Pt has had previous cervical laminectomy.  Patient at baseline ambulates with a cane. Today patient reports paresthesias in both his arms as well as his legs and difficulty with ambulation and weakness.Pt has severe spinal stenosis on MRI done  and case was discussed with neurosurgery on-call, Dr. Cari Caraway. He underwent surgery. Now awaiting snf. Also was given lasix for LE edema. Held it prior to surgery. Bp on low side, can resume once bp better  7/29-8/1:  Waiting for SNF placement  Assessment and Plan: * Cervical myelopathy with cervical radiculopathy (HCC) s/p cervical decompression and fusion for cervical myelopathy.  -Continue pain management -PT, OT recommends SNF  - HV removed 7/26 per neurosurgery -  follow up with neurosurgery outpatient in 2 weeks for staple removal  Ambulatory dysfunction Again attribute to his cervical spinal cord stenosis patient also has moderate to severe lumbar spine degenerative disc disease. Fall precautions.  Essential hypertension, benign AAA (abdominal aortic aneurysm) (HCC) 3.5 cm abdominal aortic aneurysm with recommendation of follow-up with imaging and specialist every 2 yrs  Anemia of chronic dz Patient has a history of anemia and GI bleed.  avoid  meloxicam and NSAIDs.  Hemoglobin 10.1  Sleep apnea         Consultants: Cardio, Neurosurgery Procedures performed: C3-4 decompression and fusion on 7/24  Disposition: Skilled nursing facility Diet recommendation:  Discharge Diet Orders (From admission, onward)     Start     Ordered   04/05/22 0000  Diet - low sodium heart healthy        04/05/22 0939           Carb modified diet DISCHARGE MEDICATION: Allergies as of 04/05/2022       Reactions   Amoxicillin Swelling   Amoxicillin-pot Clavulanate Hives, Shortness Of Breath, Swelling   Pt states he had to go to the hospital because of this situation Pt states he had to go to the hospital because of this situation   Mecobalamin Nausea Only        Medication List     STOP taking these medications    meloxicam 15 MG tablet Commonly known as: MOBIC   oxyCODONE-acetaminophen 10-325 MG tablet Commonly known as: PERCOCET       TAKE these medications    albuterol 108 (90 Base) MCG/ACT inhaler Commonly known as: VENTOLIN HFA Inhale into the lungs.   aspirin EC 81 MG tablet Take 81 mg by mouth.   augmented betamethasone dipropionate 0.05 % ointment Commonly known as: DIPROLENE-AF APPLY TO AFFECTED AREAS TWICE DAILY UNTIL CLEAR   docusate sodium 100 MG capsule Commonly known as: COLACE Take by mouth.   fluticasone 50 MCG/ACT nasal spray Commonly known as: FLONASE SPRAY 2 SPRAYS INTO EACH NOSTRIL EVERY DAY   loratadine 10 MG tablet Commonly  known as: CLARITIN Take 10 mg by mouth daily as needed.   mupirocin ointment 2 % Commonly known as: BACTROBAN APPLY TO AFFECTED AREA 3 TIMES A DAY FOR 7 DAYS   omeprazole 40 MG capsule Commonly known as: PRILOSEC Take 40 mg by mouth daily.   oxyCODONE 5 MG immediate release tablet Commonly known as: Oxy IR/ROXICODONE Take 1 tablet (5 mg total) by mouth every 6 (six) hours as needed for up to 3 days for moderate pain, severe pain or breakthrough pain.    potassium chloride SA 20 MEQ tablet Commonly known as: KLOR-CON M TAKE 1 TABLET (20 MEQ TOTAL) BY MOUTH 3 (THREE) TIMES DAILY.   tamsulosin 0.4 MG Caps capsule Commonly known as: FLOMAX Take 0.4 mg by mouth daily.   Testosterone 10 MG/ACT (2%) Gel Apply 20 mg topically daily.   torsemide 20 MG tablet Commonly known as: DEMADEX Take 1 tablet by mouth daily.   triamcinolone 0.025 % cream Commonly known as: KENALOG triamcinolone acetonide 0.025 % topical cream   Vitamin D3 25 MCG (1000 UT) Caps Take 1,000 Units by mouth daily.               Discharge Care Instructions  (From admission, onward)           Start     Ordered   04/05/22 0000  Discharge wound care:       Comments: As above   04/05/22 4270            Contact information for follow-up providers     Loleta Dicker, PA Follow up in 2 week(s).   Specialty: Neurosurgery Why: for post-op, incision check and staple removal Contact information: [address] [place] [address] [phone]         Baxter Hire, MD. Schedule an appointment as soon as possible for a visit in 1 week(s).   Specialty: Internal Medicine Why: [facility] Discharge F/UP Contact information: [place] [address] [phone]              Contact information for after-discharge care     Destination     [facility] Preferred SNF .   Service: Skilled Nursing Contact information: [place] [phone]                    Discharge Exam: Danley Danker Weights   04/03/22 0352 04/04/22 0415 04/05/22 0406  Weight: 107.5 kg 100 kg 20.31 kg   87 year old male lying in the bed comfortably Lungs clear to auscultation bilaterally Cardiovascular regular rate and rhythm Abdomen soft, benign Neuro alert and oriented, nonfocal Skin no rash or lesion  Condition at discharge: fair  The results of  significant diagnostics from this hospitalization (including imaging, microbiology, ancillary and laboratory) are listed below for reference.   Imaging Studies: DG Cervical Spine 2-3 Views  Result Date: 03/27/2022 CLINICAL DATA:  C3-C4 fusion and discectomy EXAM: CERVICAL SPINE - 2-3 VIEW COMPARISON:  03/23/2022 MRI cervical spine FINDINGS: Three fluoroscopic images are obtained during the performance of the procedure and are provided for interpretation only. Images demonstrate posterior fusion at C3-C4. Prior C4-C5 ACDF. Fluoroscopy time: 9 seconds 0.61 mGy IMPRESSION: Intraoperative documentation of C3-C4 fusion. Electronically Signed   By: Merilyn Baba M.D.   On: 03/27/2022 16:02   DG C-Arm 1-60 Min-No Report  Result Date: 03/27/2022 Fluoroscopy was utilized by the requesting physician.  No radiographic interpretation.  DG C-Arm 1-60 Min-No Report  Result Date: 03/27/2022 Fluoroscopy was utilized by the requesting physician.  No radiographic interpretation.   US Venous Img Lower Bilateral (DVT)  Result Date: 03/24/2022 CLINICAL DATA:  Bilateral lower extremity swelling. EXAM: BILATERAL LOWER EXTREMITY VENOUS DOPPLER ULTRASOUND TECHNIQUE: Gray-scale sonography with graded compression, as well as color Doppler and duplex ultrasound were performed to evaluate the lower extremity deep venous systems from the level of the common femoral vein and including the common femoral, femoral, profunda femoral, popliteal and calf veins including the posterior tibial, peroneal and gastrocnemius veins when visible. The superficial great saphenous vein was also interrogated. Spectral Doppler was utilized to evaluate flow at rest and with distal augmentation maneuvers in the common femoral, femoral and popliteal veins. COMPARISON:  None Available. FINDINGS: RIGHT LOWER EXTREMITY Common Femoral Vein: No evidence of thrombus. Normal compressibility, respiratory phasicity and response to augmentation. Saphenofemoral  Junction: No evidence of thrombus. Normal compressibility and flow on color Doppler imaging. Profunda Femoral Vein: No evidence of thrombus. Normal compressibility and flow on color Doppler imaging. Femoral Vein: No evidence of thrombus. Normal compressibility, respiratory phasicity and response to augmentation. Popliteal Vein: No evidence of thrombus. Normal compressibility, respiratory phasicity and response to augmentation. Calf Veins: Limited visualization due to edema. No evidence of thrombus. Normal compressibility and flow on color Doppler imaging. LEFT LOWER EXTREMITY Common Femoral Vein: No evidence of thrombus. Normal compressibility, respiratory phasicity and response to augmentation. Saphenofemoral Junction: No evidence of thrombus. Normal compressibility and flow on color Doppler imaging. Profunda Femoral Vein: No evidence of thrombus. Normal compressibility and flow on color Doppler imaging. Femoral Vein: No evidence of thrombus. Normal compressibility, respiratory phasicity and response to augmentation. Popliteal Vein: No evidence of thrombus. Normal compressibility, respiratory phasicity and response to augmentation. Calf Veins: Limited visualization due to edema no evidence of thrombus. Normal compressibility and flow on color Doppler imaging. IMPRESSION: No evidence of deep venous thrombosis in either lower extremity with limited visualization of the calf veins due to edema. Electronically Signed   By: Margaretha Sheffield M.D.   On: 03/24/2022 14:46   MR Cervical Spine W or Wo Contrast  Addendum Date: 03/23/2022   ADDENDUM REPORT: 03/23/2022 23:54 ADDENDUM: Critical Value/emergent results were called by telephone at the time of interpretation on 03/23/2022 at 11:54 pm to provider [facility] , who verbally acknowledged these results. Electronically Signed   By: Ulyses Jarred M.D.   On: 03/23/2022 23:54   Result Date: 03/23/2022 CLINICAL DATA:  Neck and low back pain EXAM: MRI CERVICAL,  THORACIC AND LUMBAR SPINE WITHOUT AND WITH CONTRAST TECHNIQUE: Multiplanar and multiecho pulse sequences of the cervical spine, to include the craniocervical junction and cervicothoracic junction, and thoracic and lumbar spine, were obtained without and with intravenous contrast. CONTRAST:  53m GADAVIST GADOBUTROL 1 MMOL/ML IV SOLN COMPARISON:  None Available. FINDINGS: MRI CERVICAL SPINE FINDINGS Alignment: Physiologic. Vertebrae: C4-5 ACDF Cord: Hyperintense T2-weighted signal within the spinal cord at the C3-4 level. Posterior Fossa, vertebral arteries, paraspinal tissues: Negative. Disc levels: C1-2: Unremarkable. C2-3: Normal disc space and facet joints. There is no spinal canal stenosis. No neural foraminal stenosis. C3-4: Ligamentum flavum redundancy and small disc bulge. Severe spinal canal stenosis. Impingement of the spinal cord with signal change. Mild right and severe left neural foraminal stenosis. C4-5: Solid anterior arthrodesis. There is no spinal canal stenosis. No neural foraminal stenosis. C5-6: Small disc bulge with endplate spurring. There is no spinal canal stenosis. Moderate bilateral neural foraminal stenosis. C6-7: Disc  space narrowing. There is no spinal canal stenosis. Mild left neural foraminal stenosis. C7-T1: Normal disc space and facet joints. There is no spinal canal stenosis. No neural foraminal stenosis. MRI THORACIC SPINE FINDINGS Alignment:  Physiologic. Vertebrae: No fracture, evidence of discitis, or bone lesion. Cord:  Normal signal and morphology. Paraspinal and other soft tissues: Negative. Disc levels: No spinal canal or neural foraminal stenosis. MRI LUMBAR SPINE FINDINGS Segmentation:  Standard. Alignment:  Physiologic. Vertebrae:  No fracture, evidence of discitis, or bone lesion. Conus medullaris and cauda equina: Conus extends to the L2 level. Conus and cauda equina appear normal. Paraspinal and other soft tissues: 3.5 cm abdominal aortic aneurysm. Disc levels: L1-L2:  Mild disc bulge with endplate spurring. No spinal canal stenosis. No neural foraminal stenosis. L2-L3: Small disc bulge with endplate spurring. No spinal canal stenosis. Mild left neural foraminal stenosis. L3-L4: Small disc bulge with endplate spurring. No spinal canal stenosis. No neural foraminal stenosis. L4-L5: Small left asymmetric disc bulge and mild facet hypertrophy. Left lateral recess narrowing without central spinal canal stenosis. Mild right neural foraminal stenosis. L5-S1: Disc space narrowing with small bulge. No spinal canal stenosis. Mild left neural foraminal stenosis. Visualized sacrum: Normal. IMPRESSION: 1. Severe C3-4 spinal canal stenosis with compression of the spinal cord and signal change consistent with compressive myelopathy. 2. Moderate bilateral neural foraminal stenosis at C5-6. 3. No thoracic or lumbar spinal canal stenosis. 4. Mild right neural foraminal stenosis at L4-5 and L5-S1. 5. 3.5 cm abdominal aortic aneurysm. Recommend follow-up every 2 years. Reference: J Am Coll Radiol 7989;21:194-174. Electronically Signed: By: Ulyses Jarred M.D. On: 03/23/2022 23:42   MR THORACIC SPINE W WO CONTRAST  Addendum Date: 03/23/2022   ADDENDUM REPORT: 03/23/2022 23:54 ADDENDUM: Critical Value/emergent results were called by telephone at the time of interpretation on 03/23/2022 at 11:54 pm to provider [facility] , who verbally acknowledged these results. Electronically Signed   By: Ulyses Jarred M.D.   On: 03/23/2022 23:54   Result Date: 03/23/2022 CLINICAL DATA:  Neck and low back pain EXAM: MRI CERVICAL, THORACIC AND LUMBAR SPINE WITHOUT AND WITH CONTRAST TECHNIQUE: Multiplanar and multiecho pulse sequences of the cervical spine, to include the craniocervical junction and cervicothoracic junction, and thoracic and lumbar spine, were obtained without and with intravenous contrast. CONTRAST:  55m GADAVIST GADOBUTROL 1 MMOL/ML IV SOLN COMPARISON:  None Available. FINDINGS: MRI CERVICAL  SPINE FINDINGS Alignment: Physiologic. Vertebrae: C4-5 ACDF Cord: Hyperintense T2-weighted signal within the spinal cord at the C3-4 level. Posterior Fossa, vertebral arteries, paraspinal tissues: Negative. Disc levels: C1-2: Unremarkable. C2-3: Normal disc space and facet joints. There is no spinal canal stenosis. No neural foraminal stenosis. C3-4: Ligamentum flavum redundancy and small disc bulge. Severe spinal canal stenosis. Impingement of the spinal cord with signal change. Mild right and severe left neural foraminal stenosis. C4-5: Solid anterior arthrodesis. There is no spinal canal stenosis. No neural foraminal stenosis. C5-6: Small disc bulge with endplate spurring. There is no spinal canal stenosis. Moderate bilateral neural foraminal stenosis. C6-7: Disc space narrowing. There is no spinal canal stenosis. Mild left neural foraminal stenosis. C7-T1: Normal disc space and facet joints. There is no spinal canal stenosis. No neural foraminal stenosis. MRI THORACIC SPINE FINDINGS Alignment:  Physiologic. Vertebrae: No fracture, evidence of discitis, or bone lesion. Cord:  Normal signal and morphology. Paraspinal and other soft tissues: Negative. Disc levels: No spinal canal or neural foraminal stenosis. MRI LUMBAR SPINE FINDINGS Segmentation:  Standard. Alignment:  Physiologic. Vertebrae:  No fracture,  evidence of discitis, or bone lesion. Conus medullaris and cauda equina: Conus extends to the L2 level. Conus and cauda equina appear normal. Paraspinal and other soft tissues: 3.5 cm abdominal aortic aneurysm. Disc levels: L1-L2: Mild disc bulge with endplate spurring. No spinal canal stenosis. No neural foraminal stenosis. L2-L3: Small disc bulge with endplate spurring. No spinal canal stenosis. Mild left neural foraminal stenosis. L3-L4: Small disc bulge with endplate spurring. No spinal canal stenosis. No neural foraminal stenosis. L4-L5: Small left asymmetric disc bulge and mild facet hypertrophy. Left  lateral recess narrowing without central spinal canal stenosis. Mild right neural foraminal stenosis. L5-S1: Disc space narrowing with small bulge. No spinal canal stenosis. Mild left neural foraminal stenosis. Visualized sacrum: Normal. IMPRESSION: 1. Severe C3-4 spinal canal stenosis with compression of the spinal cord and signal change consistent with compressive myelopathy. 2. Moderate bilateral neural foraminal stenosis at C5-6. 3. No thoracic or lumbar spinal canal stenosis. 4. Mild right neural foraminal stenosis at L4-5 and L5-S1. 5. 3.5 cm abdominal aortic aneurysm. Recommend follow-up every 2 years. Reference: J Am Coll Radiol 6295;28:413-244. Electronically Signed: By: Ulyses Jarred M.D. On: 03/23/2022 23:42   MR Lumbar Spine W Wo Contrast  Addendum Date: 03/23/2022   ADDENDUM REPORT: 03/23/2022 23:54 ADDENDUM: Critical Value/emergent results were called by telephone at the time of interpretation on 03/23/2022 at 11:54 pm to provider [facility] , who verbally acknowledged these results. Electronically Signed   By: Ulyses Jarred M.D.   On: 03/23/2022 23:54   Result Date: 03/23/2022 CLINICAL DATA:  Neck and low back pain EXAM: MRI CERVICAL, THORACIC AND LUMBAR SPINE WITHOUT AND WITH CONTRAST TECHNIQUE: Multiplanar and multiecho pulse sequences of the cervical spine, to include the craniocervical junction and cervicothoracic junction, and thoracic and lumbar spine, were obtained without and with intravenous contrast. CONTRAST:  46m GADAVIST GADOBUTROL 1 MMOL/ML IV SOLN COMPARISON:  None Available. FINDINGS: MRI CERVICAL SPINE FINDINGS Alignment: Physiologic. Vertebrae: C4-5 ACDF Cord: Hyperintense T2-weighted signal within the spinal cord at the C3-4 level. Posterior Fossa, vertebral arteries, paraspinal tissues: Negative. Disc levels: C1-2: Unremarkable. C2-3: Normal disc space and facet joints. There is no spinal canal stenosis. No neural foraminal stenosis. C3-4: Ligamentum flavum redundancy and  small disc bulge. Severe spinal canal stenosis. Impingement of the spinal cord with signal change. Mild right and severe left neural foraminal stenosis. C4-5: Solid anterior arthrodesis. There is no spinal canal stenosis. No neural foraminal stenosis. C5-6: Small disc bulge with endplate spurring. There is no spinal canal stenosis. Moderate bilateral neural foraminal stenosis. C6-7: Disc space narrowing. There is no spinal canal stenosis. Mild left neural foraminal stenosis. C7-T1: Normal disc space and facet joints. There is no spinal canal stenosis. No neural foraminal stenosis. MRI THORACIC SPINE FINDINGS Alignment:  Physiologic. Vertebrae: No fracture, evidence of discitis, or bone lesion. Cord:  Normal signal and morphology. Paraspinal and other soft tissues: Negative. Disc levels: No spinal canal or neural foraminal stenosis. MRI LUMBAR SPINE FINDINGS Segmentation:  Standard. Alignment:  Physiologic. Vertebrae:  No fracture, evidence of discitis, or bone lesion. Conus medullaris and cauda equina: Conus extends to the L2 level. Conus and cauda equina appear normal. Paraspinal and other soft tissues: 3.5 cm abdominal aortic aneurysm. Disc levels: L1-L2: Mild disc bulge with endplate spurring. No spinal canal stenosis. No neural foraminal stenosis. L2-L3: Small disc bulge with endplate spurring. No spinal canal stenosis. Mild left neural foraminal stenosis. L3-L4: Small disc bulge with endplate spurring. No spinal canal stenosis. No neural foraminal stenosis.  L4-L5: Small left asymmetric disc bulge and mild facet hypertrophy. Left lateral recess narrowing without central spinal canal stenosis. Mild right neural foraminal stenosis. L5-S1: Disc space narrowing with small bulge. No spinal canal stenosis. Mild left neural foraminal stenosis. Visualized sacrum: Normal. IMPRESSION: 1. Severe C3-4 spinal canal stenosis with compression of the spinal cord and signal change consistent with compressive myelopathy. 2.  Moderate bilateral neural foraminal stenosis at C5-6. 3. No thoracic or lumbar spinal canal stenosis. 4. Mild right neural foraminal stenosis at L4-5 and L5-S1. 5. 3.5 cm abdominal aortic aneurysm. Recommend follow-up every 2 years. Reference: J Am Coll Radiol 1607;37:106-269. Electronically Signed: By: Ulyses Jarred M.D. On: 03/23/2022 23:42    Microbiology: Results for orders placed or performed during the hospital encounter of 03/23/22  Surgical pcr screen     Status: None   Collection Time: 03/26/22  8:42 PM   Specimen: Nasal Mucosa; Nasal Swab  Result Value Ref Range Status   MRSA, PCR NEGATIVE NEGATIVE Final   Staphylococcus aureus NEGATIVE NEGATIVE Final    Comment: (NOTE) The Xpert SA Assay (FDA approved for NASAL specimens in patients 55 years of age and older), is one component of a comprehensive surveillance program. It is not intended to diagnose infection nor to guide or monitor treatment. Performed at [facility], [address] [place], [place] [address]     Labs: CBC: Recent Labs  Lab 03/31/22 0523 04/01/22 0427 04/02/22 0502  WBC 12.0* 8.3 8.7  HGB 11.0* 10.5* 10.1*  HCT 32.6* 31.4* 30.0*  MCV 86.7 85.8 86.7  PLT 146* 161 270   Basic Metabolic Panel: Recent Labs  Lab 04/02/22 0502  NA 133*  K 3.6  CL 100  CO2 26  GLUCOSE 101*  BUN 28*  CREATININE 0.83  CALCIUM 9.1   Liver Function Tests: No results for input(s): "AST", "ALT", "ALKPHOS", "BILITOT", "PROT", "ALBUMIN" in the last 168 hours. CBG: Recent Labs  Lab 03/30/22 2315 04/03/22 0832  GLUCAP 120* 113*    Discharge time spent: greater than 30 minutes.  Signed: Max Sane, MD [facility] 04/05/2022

## 2022-04-05 NOTE — TOC Transition Note (Signed)
Transition of Care [facility]) - CM/SW Discharge Note   Patient Details  Name: Thor Nannini MRN: [id number] Date of Birth: [birth year]  [facility]) CM/SW Contact:  Candie Chroman, LCSW Phone Number: 04/05/2022, 11:06 AM   Clinical Narrative:  Patient has orders to discharge to [facility] today. RN has already called report. EMS transport has been arranged and he is 3rd on the list. No further concerns. CSW signing off.   Final next level of care: Skilled Nursing Facility Barriers to Discharge: Barriers Resolved   Patient Goals and CMS Choice   CMS Medicare.gov Compare Post Acute Care list provided to:: Patient Choice offered to / list presented to : Patient, Adult Children  Discharge Placement PASRR number recieved: 03/29/22            Patient chooses bed at: Other - please specify in the comment section below: ([place]) Patient to be transferred to facility by: EMS Name of family member notified: Darylene Price Patient and family notified of of transfer: 04/05/22  Discharge Plan and Services     Post Acute Care Choice: [place]                               Social Determinants of Health (SDOH) Interventions     Readmission Risk Interventions     No data to display

## 2022-04-05 NOTE — Progress Notes (Signed)
Report called to [facility], report given to RN LaToya.

## 2022-04-05 NOTE — TOC Progression Note (Signed)
Transition of Care [facility]) - Progression Note    Patient Details  Name: [name] Bean MRN: [id number] Date of Birth: [birth year]  [facility] CM/SW Contact  Beverly Sessions, RN Phone Number: 04/05/2022, 8:49 AM  Clinical Narrative:      [facility] [place] [facility] confirms the can accept patient today  Yesterday per son request [name] [phone] ext [phone] at [facility] rehab to determine if they would be an option.  Son is aware that patient is medically ready for discharge. And has a SNF that is able to accept him today   Expected Discharge Plan: [place] Barriers to Discharge: Continued Medical Work up  Expected Discharge Plan and Services Expected Discharge Plan: [place] Choice: [place] arrangements for the past 2 months: Apartment                                       Social Determinants of Health (SDOH) Interventions    Readmission Risk Interventions     No data to display

## 2022-04-05 NOTE — Progress Notes (Signed)
Physical Therapy Treatment Patient Details Name: [name] Bean MRN: [id number] DOB: 08/25/1935 Today's Date: 04/05/2022   History of Present Illness [name] Bean is an 87 y.o. male seen in ed with complaints of neck pain.  Pt has had previous cervical laminectomy.  Patient at baseline ambulates with a cane.  Today patient reports paresthesias in both his arms as well as his legs and difficulty with ambulation and weakness.  Patient reports pain that started in the left shoulder going to the right and also down his spine. .  Patient does report history of steroids for his arthritis.  Pt has severe spinal stenosis on MRI done today and case was discussed with neurosurgery on-call, Dr. Cari Caraway. Patient is s/p s/p C3-4 decompression and fusion 03/27/22.    PT Comments    Pt received supine in bed reporting pain is improved with pre-medication. Agreeable to PT. Pt modA with bed features and HOB maximally elevated to transfer to sitting EoB maintaining cervical spine in neutral positioning. In sitting pt requiring AAROM for all LLE exercises due to weakness. Frequent VC's for upright posture for cervical strengthening utilizing window and outdoor seeting as visual cues for maintaining upright posture with fair to poor carryover as pt quickly and frequently returns to kyphotic posture with cervical flexion. X2 attempts maxA+1 to stand with inability to perform. With maxA+2 and bed elevated, pt able to stand but demonstrates poor ability to extend at hips and knees reliant on blocking of bilat feet. MaxA+2 to return to upright supine in bed with all needs in reach. Pt obviously fatigued with significant sweating and WOB that improves with supine rest.  D/c recs for STR remain appropriate at this time.     Recommendations for follow up therapy are one component of a multi-disciplinary discharge planning process, led by the attending physician.  Recommendations may be updated based on patient status, additional  functional criteria and insurance authorization.  Follow Up Recommendations  Skilled nursing-short term rehab (<3 hours/day) Can patient physically be transported by private vehicle: No   Assistance Recommended at Discharge Intermittent Supervision/Assistance  Patient can return home with the following Two people to help with walking and/or transfers;Two people to help with bathing/dressing/bathroom;Help with stairs or ramp for entrance;Assistance with feeding;Direct supervision/assist for medications management;Assistance with cooking/housework;Assist for transportation   Equipment Recommendations   (tbd by next venue of care)    Recommendations for Other Services       Precautions / Restrictions Precautions Precautions: Fall;Cervical Precaution Booklet Issued: No Restrictions Weight Bearing Restrictions: No     Mobility  Bed Mobility Overal bed mobility: Needs Assistance       Supine to sit: Mod assist, HOB elevated Sit to supine: Total assist, +2 for physical assistance     Patient Response: Cooperative  Transfers Overall transfer level: Needs assistance Equipment used: Rolling walker (2 wheels) Transfers: Sit to/from Stand Sit to Stand: Total assist, +2 physical assistance, From elevated surface           General transfer comment: x2 trials, limited hip and knee extension despite max multimodal cues    Ambulation/Gait               General Gait Details: deferred due to difficulty standing   Stairs             Wheelchair Mobility    Modified Rankin (Stroke Patients Only)       Balance Overall balance assessment: Needs assistance Sitting-balance support: Feet supported, Bilateral upper extremity supported Sitting  balance-Leahy Scale: Fair     Standing balance support: Bilateral upper extremity supported, During functional activity, Reliant on assistive device for balance Standing balance-Leahy Scale: Zero                               Cognition Arousal/Alertness: Awake/alert Behavior During Therapy: WFL for tasks assessed/performed Overall Cognitive Status: Within Functional Limits for tasks assessed                                          Exercises General Exercises - Lower Extremity Long Arc Quad: AROM, Strengthening, Right, AAROM, Left, Seated, 10 reps Hip ABduction/ADduction: AROM, Strengthening, Both, 10 reps, Seated (manual resistance provided) Hip Flexion/Marching: AAROM, Both, 10 reps, Seated, Strengthening Toe Raises: AROM, Strengthening, Right, AAROM, Left, 10 reps, Seated Heel Raises: AROM, Strengthening, Both, 10 reps, Seated Other Exercises Other Exercises: scap retractions in sitting: x10    General Comments        Pertinent Vitals/Pain Pain Assessment Pain Assessment: Faces Faces Pain Scale: Hurts little more Pain Location: neck pain Pain Descriptors / Indicators: Discomfort, Grimacing, Guarding Pain Intervention(s): Monitored during session, Patient requesting pain meds-RN notified    Home Living                          Prior Function            PT Goals (current goals can now be found in the care plan section) Acute Rehab PT Goals Patient Stated Goal: to improve, decrease pain PT Goal Formulation: With patient/family Time For Goal Achievement: 04/12/22 Potential to Achieve Goals: Good Progress towards PT goals: Progressing toward goals    Frequency    7X/week      PT Plan Current plan remains appropriate    Co-evaluation              AM-PAC PT "6 Clicks" Mobility   Outcome Measure  Help needed turning from your back to your side while in a flat bed without using bedrails?: A Lot Help needed moving from lying on your back to sitting on the side of a flat bed without using bedrails?: A Lot Help needed moving to and from a bed to a chair (including a wheelchair)?: Total Help needed standing up from a chair using your arms  (e.g., wheelchair or bedside chair)?: Total Help needed to walk in hospital room?: Total Help needed climbing 3-5 steps with a railing? : Total 6 Click Score: 8    End of Session Equipment Utilized During Treatment: Gait belt Activity Tolerance: Patient tolerated treatment well;No increased pain Patient left: in bed;with call bell/phone within reach;with bed alarm set;with family/visitor present Nurse Communication: Mobility status PT Visit Diagnosis: Other abnormalities of gait and mobility (R26.89);Muscle weakness (generalized) (M62.81);Difficulty in walking, not elsewhere classified (R26.2);Pain     Time: 3614-4315 PT Time Calculation (min) (ACUTE ONLY): 34 min  Charges:  $Therapeutic Exercise: 8-22 mins $Therapeutic Activity: 8-22 mins                    Salem Caster. Fairly IV, PT, DPT Physical Therapist- [facility]  04/05/2022, 10:35 AM

## 2022-04-11 ENCOUNTER — Encounter: Payer: Medicare Other | Admitting: Neurosurgery

## 2022-04-11 IMAGING — CT CT ABD-PELV W/ CM
2 of 5 series · 16 of 46 positions shown, 18 images · IV contrast (APPLIED)
Comparison: 01/29/2013

CLINICAL DATA: Left lower quadrant abdominal pain since an accident
in March 2021.

EXAM:
CT ABDOMEN AND PELVIS WITH CONTRAST
TECHNIQUE: Multidetector CT imaging of the abdomen and pelvis was performed
using the standard protocol following bolus administration of
intravenous contrast.

[Series 2: routine abd/pel with · axial · 0.98mm/px · z∈[-521,-96]mm · 13 of 96 slices shown, 15 images]
[im 6/96  soft-tissue]
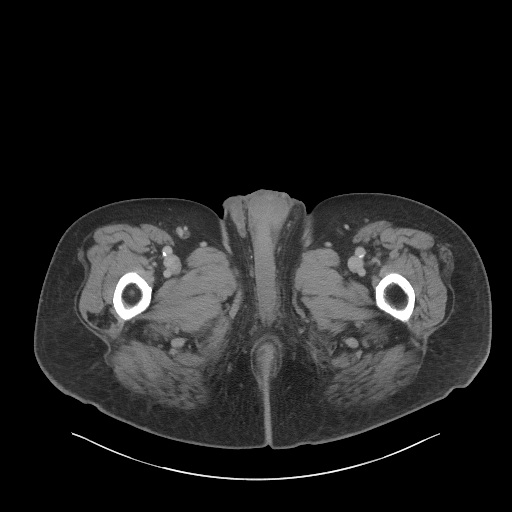
[im 6/96  bone]
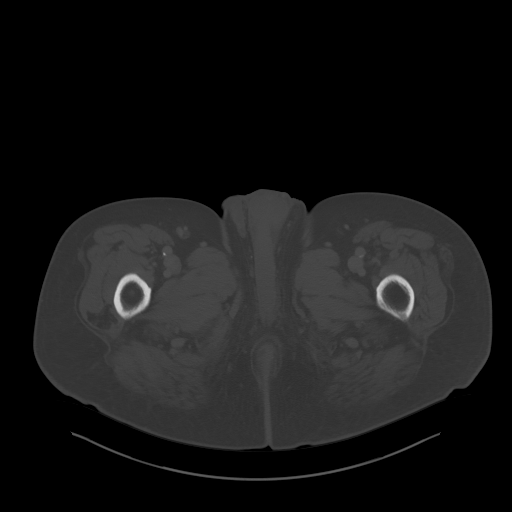
[im 16/96  soft-tissue]
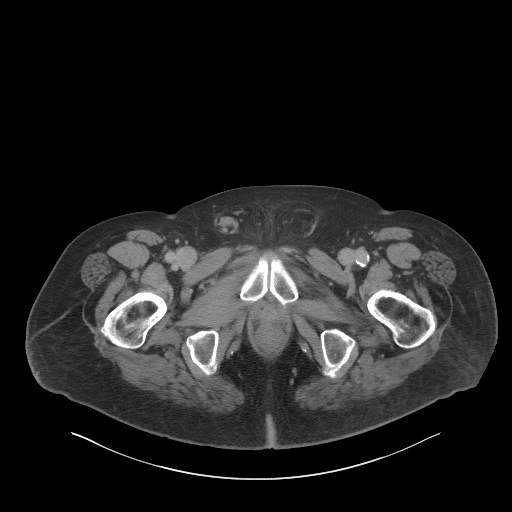
[im 21/96  soft-tissue]
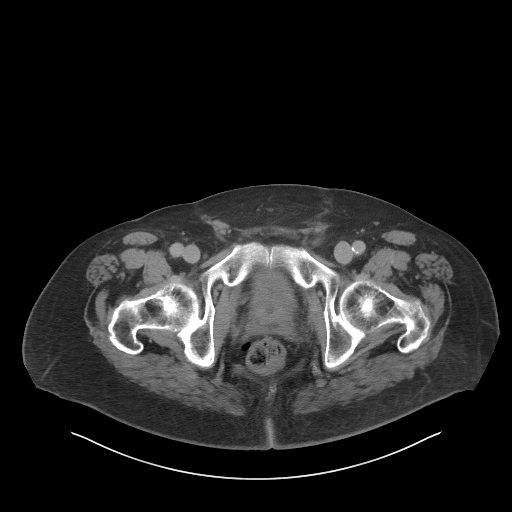
[im 26/96  soft-tissue]
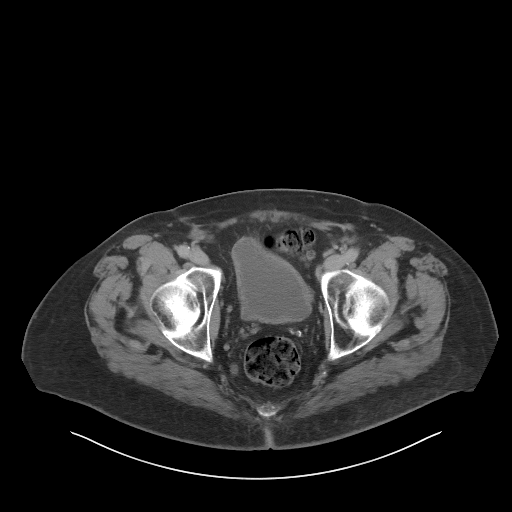
[im 36/96  soft-tissue]
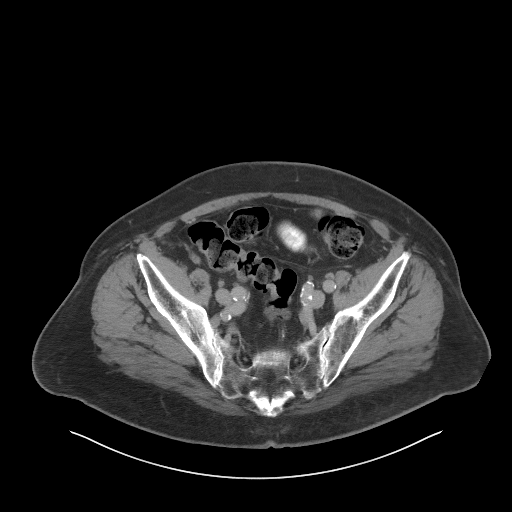
[im 41/96  soft-tissue]
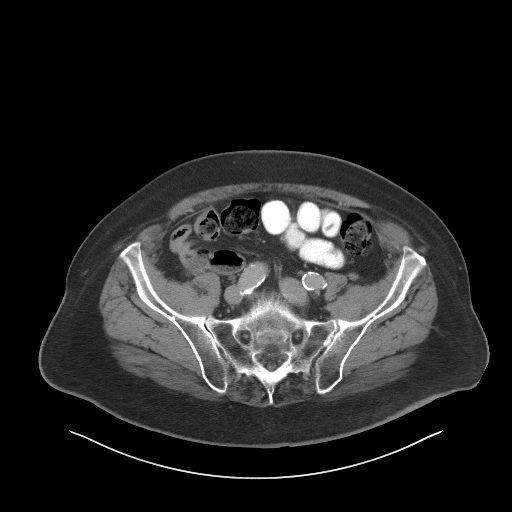
[im 51/96  soft-tissue]
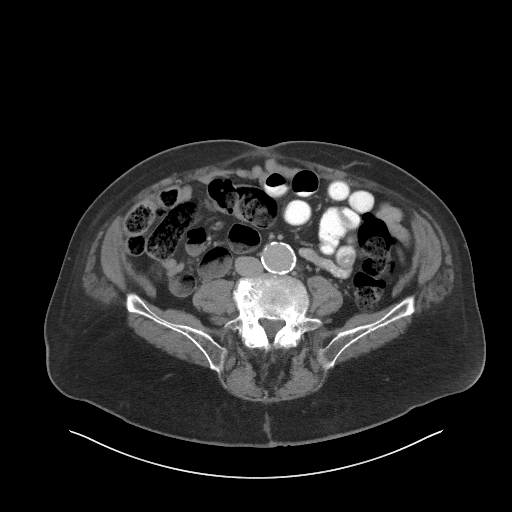
[im 56/96  soft-tissue]
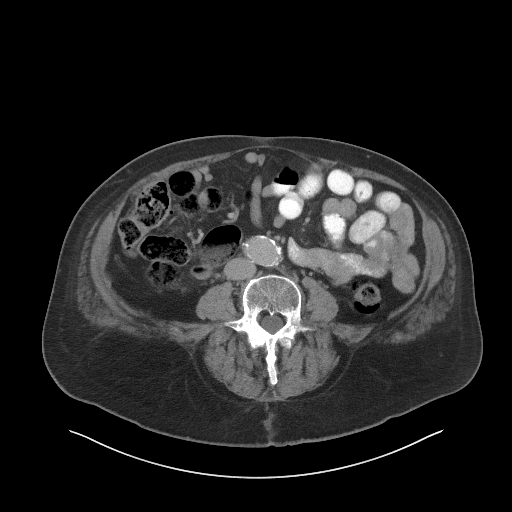
[im 61/96  soft-tissue]
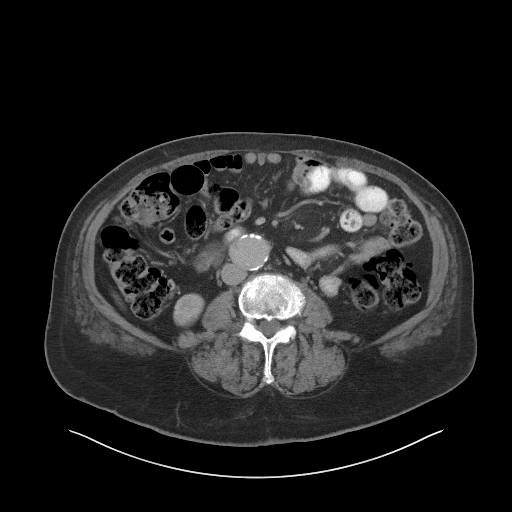
[im 61/96  bone]
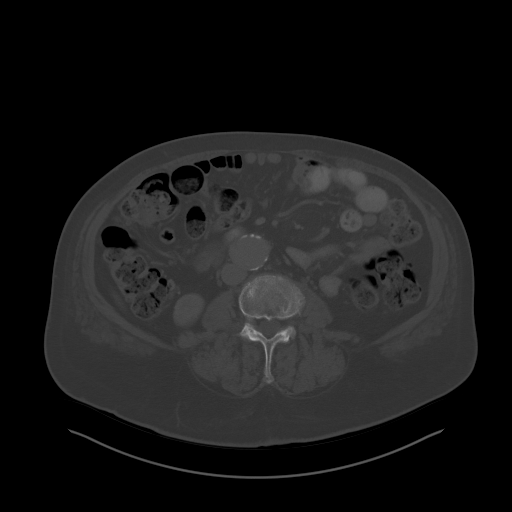
[im 71/96  soft-tissue]
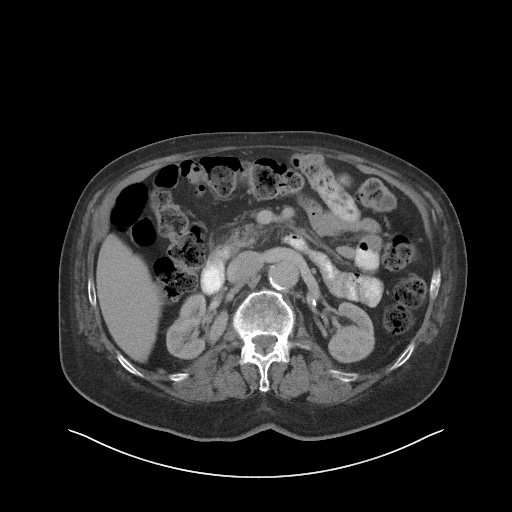
[im 76/96  soft-tissue]
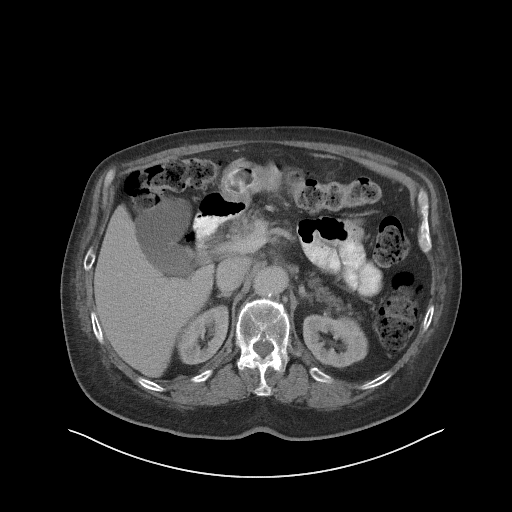
[im 81/96  soft-tissue]
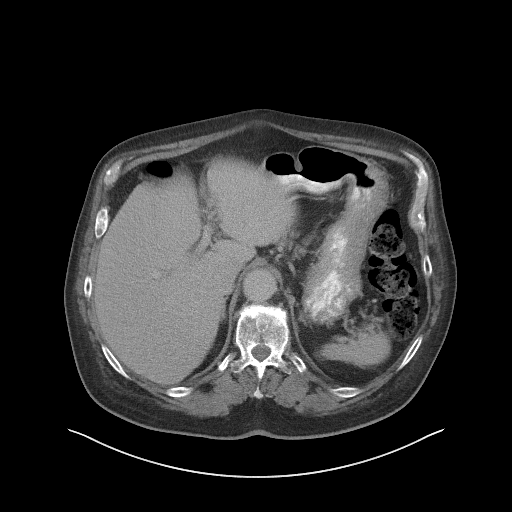
[im 91/96  soft-tissue]
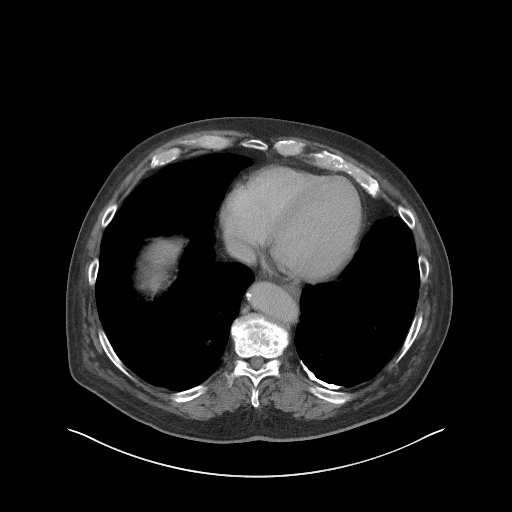

[Series 5: coronal st · coronal · 0.91mm/px · 3 of 113 slices shown]
[im 38/113  soft-tissue]
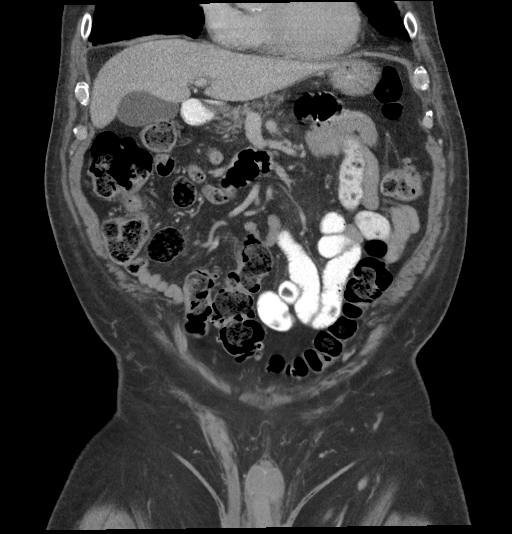
[im 50/113  soft-tissue]
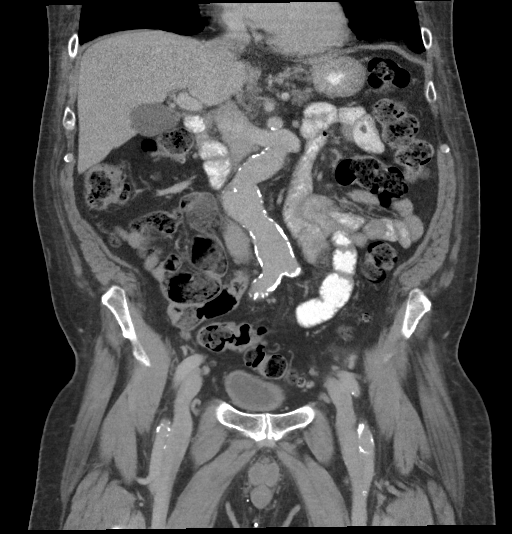
[im 63/113  soft-tissue]
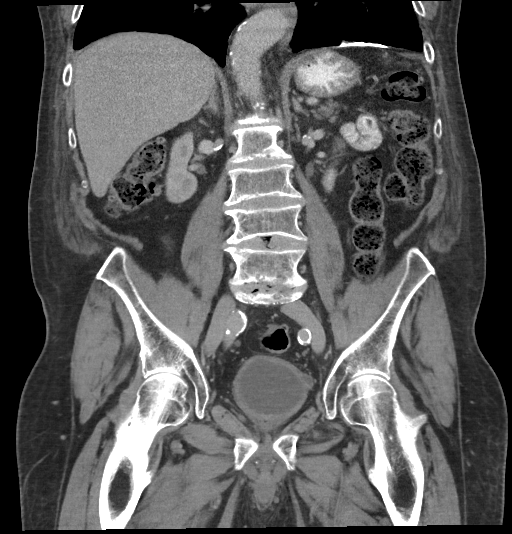

[16 of 46 positions shown; findings below may reference images not displayed]

RADIATION DOSE REDUCTION: This exam was performed according to the
departmental dose-optimization program which includes automated
exposure control, adjustment of the mA and/or kV according to
patient size and/or use of iterative reconstruction technique.

CONTRAST:  100mL OMNIPAQUE IOHEXOL 300 MG/ML  SOLN
FINDINGS: Lower chest: Dense left diaphragmatic calcified pleural plaque
without significant change.

Hepatobiliary: No focal liver abnormality is seen. No gallstones,
gallbladder wall thickening, or biliary dilatation.

Pancreas: Unremarkable. No pancreatic ductal dilatation or
surrounding inflammatory changes.

Spleen: Normal in size without focal abnormality.

Adrenals/Urinary Tract: Normal appearing adrenal glands. Tiny left
renal cyst. Unremarkable right kidney, ureters and urinary bladder.

Stomach/Bowel: Mild colon diverticulosis without evidence of
diverticulitis. The appendix is not visualized. No evidence of
appendicitis. Unremarkable stomach and small bowel.

Vascular/Lymphatic: Mild fusiform aneurysmal dilatation of the
infrarenal abdominal aorta with a maximum diameter of 3.5 cm. There
is also mild fusiform dilatation of the common iliac arteries,
measuring 2.2 cm in diameter on the right and 1.8 cm in diameter on
the left. There is also mild fusiform aneurysmal dilatation of the
internal iliac arteries, measuring 1.4 cm in diameter on the right
and 1.3 cm on the left. No enlarged lymph nodes.

Reproductive: Prostate is unremarkable.

Other: Suggestion of a linear surgical scar in both inguinal regions
with absence of the normal inguinal canal contents on the left,
suggesting the possibility of congenital or surgical absence of the
left testicle.

Musculoskeletal: Lumbar and lower thoracic spine degenerative
changes and multiple minimal compression deformities with no acute
fracture lines or bony retropulsion.
IMPRESSION: 1. No acute abnormality.
2. Interval mild increase in size of the previously demonstrated
cm abdominal aortic aneurysm, currently measuring 3.5 cm. Recommend
follow-up ultrasound every 2 years. This recommendation follows ACR
consensus guidelines: White Paper of the ACR Incidental Findings
Committee II on Vascular Findings. [HOSPITAL] 2156;
[DATE].
3. Mild fusiform aneurysmal dilatation of the common iliac and
internal iliac arteries bilaterally.
4. Mild colonic diverticulosis without evidence of diverticulitis.
Stable left
5. Diaphragmatic calcified pleural plaque formation, suggesting
previous asbestos exposure.

## 2022-04-18 ENCOUNTER — Encounter: Payer: Medicare Other | Admitting: Neurosurgery

## 2022-04-21 ENCOUNTER — Ambulatory Visit (INDEPENDENT_AMBULATORY_CARE_PROVIDER_SITE_OTHER): Payer: Medicare Other | Admitting: Vascular Surgery

## 2022-05-05 ENCOUNTER — Other Ambulatory Visit: Payer: Self-pay

## 2022-05-05 DIAGNOSIS — Z981 Arthrodesis status: Secondary | ICD-10-CM

## 2022-05-09 ENCOUNTER — Encounter: Payer: Medicare Other | Admitting: Neurosurgery

## 2022-05-18 ENCOUNTER — Ambulatory Visit (INDEPENDENT_AMBULATORY_CARE_PROVIDER_SITE_OTHER): Payer: Medicare Other | Admitting: Neurosurgery

## 2022-05-18 ENCOUNTER — Ambulatory Visit
Admission: RE | Admit: 2022-05-18 | Discharge: 2022-05-18 | Disposition: A | Discharge status: Home / Self Care | Payer: No Typology Code available for payment source | Source: Ambulatory Visit | Attending: Neurosurgery | Admitting: Neurosurgery

## 2022-05-18 ENCOUNTER — Encounter: Payer: Self-pay | Admitting: Neurosurgery

## 2022-05-18 VITALS — BP 125/63 | HR 83 | Temp 98.1°F

## 2022-05-18 DIAGNOSIS — Z981 Arthrodesis status: Secondary | ICD-10-CM | POA: Diagnosis present

## 2022-05-18 DIAGNOSIS — G959 Disease of spinal cord, unspecified: Secondary | ICD-10-CM

## 2022-05-18 DIAGNOSIS — M4802 Spinal stenosis, cervical region: Secondary | ICD-10-CM

## 2022-05-18 DIAGNOSIS — Z09 Encounter for follow-up examination after completed treatment for conditions other than malignant neoplasm: Secondary | ICD-10-CM | POA: Diagnosis not present

## 2022-05-18 MED ORDER — METHOCARBAMOL 500 MG PO TABS: 500.0000 mg | ORAL_TABLET | Freq: Four times a day (QID) | ORAL | 0 refills | Status: AC | PRN

## 2022-05-18 NOTE — Progress Notes (Signed)
   DOS: 03/27/22 (C3-4 PSFD)  HISTORY OF PRESENT ILLNESS: 05/18/2022 [name] is status post C3-4 posterior spinal fusion with decompression.   PHYSICAL EXAMINATION:   Vitals:   05/18/22 1431  BP: 125/63  Pulse: 83  Temp: 98.1 F (36.7 C)   General: Patient is well developed, well nourished, calm, collected, and in no apparent distress.  NEUROLOGICAL:  General: In no acute distress.  Awake, alert, oriented to person, place, and time. Pupils equal round and reactive to light.   Strength: Side Biceps Triceps Deltoid Interossei Grip Wrist Ext. Wrist Flex.  R '4 4 4 4 '$ 4- 4- 4-  L '4 4 4 4 '$ 4- 4- 4-   Incision c/d/i   ROS (Neurologic): Negative except as noted above  IMAGING: No complications noted  ASSESSMENT/PLAN:  [name] Bean is doing well after posterior cervical decompression and fusion for cervical myelopathy.  He will continue with therapy in the nursing facility.  I will give him a muscle relaxant for his neck discomfort.  I will see him back in 6 weeks with x-rays.  I spent a total of 10 minutes in face-to-face and non-face-to-face activities related to this patient's care today.   Meade Maw MD, [facility] Department of Neurosurgery

## 2022-06-13 ENCOUNTER — Telehealth: Payer: Self-pay

## 2022-06-13 NOTE — Telephone Encounter (Signed)
-----   Message from Peggyann Shoals sent at 06/13/2022  3:38 PM EDT ----- Regarding: outside xray results Contact: [phone] Xrays scanned under media with date of 04/29/2022. Patient's son Saralyn Pilar called several weeks ago that he had just seen the results and if there was anything to be concerned with.

## 2022-06-14 NOTE — Telephone Encounter (Signed)
I notified [name] Bean of Dr Rhea Bleacher response and informed him that they can make an appointment with Marzetta Board if it is bothering him enough to want further workup. They will discuss it and call us back if they would like an appointment.

## 2022-06-20 ENCOUNTER — Encounter: Payer: Medicare Other | Admitting: Neurosurgery

## 2022-06-26 ENCOUNTER — Other Ambulatory Visit: Payer: Self-pay

## 2022-06-26 DIAGNOSIS — Z981 Arthrodesis status: Secondary | ICD-10-CM

## 2022-06-29 ENCOUNTER — Encounter: Payer: Medicare Other | Admitting: Neurosurgery

## 2022-12-01 ENCOUNTER — Other Ambulatory Visit (INDEPENDENT_AMBULATORY_CARE_PROVIDER_SITE_OTHER): Payer: Medicare Other

## 2022-12-01 ENCOUNTER — Ambulatory Visit (INDEPENDENT_AMBULATORY_CARE_PROVIDER_SITE_OTHER): Payer: Medicare Other | Admitting: Vascular Surgery

## 2022-12-19 ENCOUNTER — Ambulatory Visit (INDEPENDENT_AMBULATORY_CARE_PROVIDER_SITE_OTHER): Payer: Medicare Other | Admitting: Vascular Surgery

## 2022-12-19 ENCOUNTER — Other Ambulatory Visit (INDEPENDENT_AMBULATORY_CARE_PROVIDER_SITE_OTHER): Payer: Medicare Other

## 2022-12-26 ENCOUNTER — Ambulatory Visit (INDEPENDENT_AMBULATORY_CARE_PROVIDER_SITE_OTHER): Payer: Medicare Other | Admitting: Vascular Surgery

## 2022-12-26 ENCOUNTER — Other Ambulatory Visit (INDEPENDENT_AMBULATORY_CARE_PROVIDER_SITE_OTHER): Payer: Medicare Other

## 2023-01-03 NOTE — Telephone Encounter (Signed)
Error

## 2023-01-16 ENCOUNTER — Ambulatory Visit
Admission: RE | Admit: 2023-01-16 | Discharge: 2023-01-16 | Disposition: A | Discharge status: Home / Self Care | Payer: Medicare Other | Source: Ambulatory Visit | Attending: Neurosurgery | Admitting: Neurosurgery

## 2023-01-16 ENCOUNTER — Ambulatory Visit
Admission: RE | Admit: 2023-01-16 | Discharge: 2023-01-16 | Disposition: A | Discharge status: Home / Self Care | Payer: Medicare Other | Attending: Neurosurgery | Admitting: Neurosurgery

## 2023-01-16 ENCOUNTER — Ambulatory Visit (INDEPENDENT_AMBULATORY_CARE_PROVIDER_SITE_OTHER): Payer: Medicare Other | Admitting: Neurosurgery

## 2023-01-16 ENCOUNTER — Other Ambulatory Visit: Payer: Self-pay

## 2023-01-16 ENCOUNTER — Encounter: Payer: Self-pay | Admitting: Neurosurgery

## 2023-01-16 VITALS — BP 128/80 | Ht 74.0 in | Wt 221.0 lb

## 2023-01-16 DIAGNOSIS — G959 Disease of spinal cord, unspecified: Secondary | ICD-10-CM | POA: Insufficient documentation

## 2023-01-16 NOTE — Progress Notes (Signed)
   DOS: 03/27/22 (C3-4 PSFD)  HISTORY OF PRESENT ILLNESS: 01/16/2023 [name] is status post C3-4 posterior spinal fusion with decompression.  He continues to deal with the weakness associated with this myelopathy.  PHYSICAL EXAMINATION:   Vitals:   01/16/23 1437  BP: 128/80   General: Patient is well developed, well nourished, calm, collected, and in no apparent distress.  NEUROLOGICAL:  General: In no acute distress.  Awake, alert, oriented to person, place, and time. Pupils equal round and reactive to light.   Strength: Side Biceps Triceps Deltoid Interossei Grip Wrist Ext. Wrist Flex.  R 4 4 4 4  4- 4- 4-  L 4 4 4 4  4- 4- 4-   Incision c/d/i   ROS (Neurologic): Negative except as noted above  IMAGING: No complications noted  ASSESSMENT/PLAN:  [name] Bean is doing well after posterior cervical decompression and fusion for cervical myelopathy.  He is reestablishing care with the pain clinic in [place].  After he does that, he will contact us and we will send a new referral for physical therapy.  I will see him back on an as-needed basis.   I spent a total of 10 minutes in face-to-face and non-face-to-face activities related to this patient's care today.   Venetia Night MD, [facility] Department of Neurosurgery

## 2023-05-13 ENCOUNTER — Other Ambulatory Visit: Payer: Self-pay | Admitting: Neurosurgery

## 2023-05-14 NOTE — Telephone Encounter (Signed)
He was released to follow up prn at his last visit.   Per his last note, he was getting re established with the pain clinic in [place]. He should get further refills of robaxin from the pain clinic or his PCP.   Please let him know.   I denied the robaxin refill to the pharmacy.

## 2023-05-14 NOTE — Telephone Encounter (Signed)
Left message to call back  

## 2023-08-14 ENCOUNTER — Telehealth (INDEPENDENT_AMBULATORY_CARE_PROVIDER_SITE_OTHER): Payer: Self-pay

## 2023-08-14 NOTE — Telephone Encounter (Signed)
Patient called requesting an appointment, but didn't leave any details. I called him and got the voicemail.  Left a message to return call when he can.

## 2023-08-15 NOTE — Telephone Encounter (Signed)
Patient return call and wanted to schedule an appointment for unna wraps. At this time the patient is getting weekly unna wraps for swelling. The patient is currently in [place] and is moving back to the area. The patient will call once he returns to schedule appointment.
